# Patient Record
Sex: Male | Born: 1955 | Race: Black or African American | Hispanic: No | Marital: Married | Smoking: Former smoker
Health system: Southern US, Community
[De-identification: ages and names within clinical notes are randomized; demographics above are authoritative.]

## PROBLEM LIST (undated history)

## (undated) DIAGNOSIS — I10 Essential (primary) hypertension: Secondary | ICD-10-CM

## (undated) HISTORY — PX: OTHER SURGICAL HISTORY: SHX169

## (undated) HISTORY — DX: Essential (primary) hypertension: I10

---

## 2016-05-20 ENCOUNTER — Encounter: Payer: Self-pay | Admitting: Physician Assistant

## 2016-05-20 ENCOUNTER — Ambulatory Visit (INDEPENDENT_AMBULATORY_CARE_PROVIDER_SITE_OTHER): Payer: Self-pay | Admitting: Physician Assistant

## 2016-05-20 VITALS — BP 230/124 | HR 96 | Ht 74.0 in | Wt 271.0 lb

## 2016-05-20 DIAGNOSIS — Z13220 Encounter for screening for lipoid disorders: Secondary | ICD-10-CM

## 2016-05-20 DIAGNOSIS — I160 Hypertensive urgency: Secondary | ICD-10-CM

## 2016-05-20 DIAGNOSIS — Z131 Encounter for screening for diabetes mellitus: Secondary | ICD-10-CM

## 2016-05-20 DIAGNOSIS — Z8673 Personal history of transient ischemic attack (TIA), and cerebral infarction without residual deficits: Secondary | ICD-10-CM

## 2016-05-20 HISTORY — DX: Personal history of transient ischemic attack (TIA), and cerebral infarction without residual deficits: Z86.73

## 2016-05-20 HISTORY — DX: Hypertensive urgency: I16.0

## 2016-05-20 MED ORDER — OLMESARTAN MEDOXOMIL-HCTZ 40-25 MG PO TABS: 1.0000 | ORAL_TABLET | Freq: Every day | ORAL | 0 refills | Status: DC

## 2016-05-20 NOTE — Progress Notes (Signed)
Subjective:    Patient ID: Jeremiah Thomas, male    DOB: 29-Apr-1956, 60 y.o.   MRN: [id number]  HPI  Pt is a 59 yo AA male who presents to the clinic to establish care. Wife is with patient. He does not have insurance and only wants to address HTN today.  .. Active Ambulatory Problems    Diagnosis Date Noted  . History of stroke 05/20/2016  . Asymptomatic hypertensive urgency 05/20/2016   Resolved Ambulatory Problems    Diagnosis Date Noted  . No Resolved Ambulatory Problems   Past Medical History:  Diagnosis Date  . Hypertension    .Marland Kitchen Family History  Problem Relation Age of Onset  . Cancer Mother     ovarian  . Hypertension Father   . Diabetes Father   . Stroke Father    .Marland Kitchen Social History   Social History  . Marital status: Married    Spouse name: N/A  . Number of children: N/A  . Years of education: N/A   Occupational History  . Not on file.   Social History Main Topics  . Smoking status: Former Research scientist (life sciences)  . Smokeless tobacco: Never Used  . Alcohol use No  . Drug use: No  . Sexual activity: Not Currently   Other Topics Concern  . Not on file   Social History Narrative  . No narrative on file   Pt has hx of stroke but does not know a lot about it. States he has never been on cholesterol medicaiton or blood pressure medication. His daughter checked BP and was over 200/160. He denies any CP, palpitations. He does have SOB with exertion, numbness of lips and dizziness off and on.      Review of Systems  Constitutional: Negative for diaphoresis.  HENT: Negative.   Eyes: Negative.   Cardiovascular: Negative for chest pain, palpitations and leg swelling.  Gastrointestinal: Negative.   Endocrine: Negative.   Genitourinary: Negative.   Musculoskeletal: Negative.   Neurological: Positive for tremors, facial asymmetry, speech difficulty, weakness and headaches.  Hematological: Negative.   Psychiatric/Behavioral: Negative.        Objective:   Physical Exam   Constitutional: He is oriented to person, place, and time. He appears well-developed and well-nourished.  HENT:  Head: Normocephalic and atraumatic.  Eyes: Conjunctivae are normal.  Neck: Normal range of motion. Neck supple.  Cardiovascular: Normal rate, regular rhythm and normal heart sounds.   Pulmonary/Chest: Effort normal and breath sounds normal. He has no wheezes.  Neurological: He is alert and oriented to person, place, and time.  Psychiatric: He has a normal mood and affect. His behavior is normal.          Assessment & Plan:  .Marland KitchenJohn was seen today for establish care and hypertension.  Diagnoses and all orders for this visit:  Asymptomatic hypertensive urgency -     olmesartan-hydrochlorothiazide (BENICAR HCT) 40-25 MG tablet; Take 1 tablet by mouth daily.  Screening for lipid disorders -     Lipid panel  Screening for diabetes mellitus -     COMPLETE METABOLIC PANEL WITH GFR  History of stroke -     olmesartan-hydrochlorothiazide (BENICAR HCT) 40-25 MG tablet; Take 1 tablet by mouth daily.   Discussed need for EKG in office. Pt declined today. He did not want any work up.  Discussed importance of labs. He is aware of risk associated with high cholesterol.  Started Benicar/HCTZ recheck in 2 weeks.  Start baby ASA a day.

## 2016-05-20 NOTE — Patient Instructions (Signed)
Start baby ASA 81mg  daily.  Start benicar daily.  Check BP at home and keep log.

## 2016-06-01 ENCOUNTER — Ambulatory Visit (INDEPENDENT_AMBULATORY_CARE_PROVIDER_SITE_OTHER): Payer: Self-pay | Admitting: Physician Assistant

## 2016-06-01 ENCOUNTER — Encounter: Payer: Self-pay | Admitting: Physician Assistant

## 2016-06-01 VITALS — BP 138/88 | HR 73 | Ht 74.0 in | Wt 268.0 lb

## 2016-06-01 DIAGNOSIS — N179 Acute kidney failure, unspecified: Secondary | ICD-10-CM

## 2016-06-01 DIAGNOSIS — Z8673 Personal history of transient ischemic attack (TIA), and cerebral infarction without residual deficits: Secondary | ICD-10-CM

## 2016-06-01 DIAGNOSIS — I10 Essential (primary) hypertension: Secondary | ICD-10-CM

## 2016-06-01 LAB — COMPLETE METABOLIC PANEL WITH GFR
ALT: 10 U/L (ref 9–46)
AST: 12 U/L (ref 10–35)
Albumin: 3.9 g/dL (ref 3.6–5.1)
Alkaline Phosphatase: 53 U/L (ref 40–115)
BUN: 23 mg/dL (ref 7–25)
CO2: 30 mmol/L (ref 20–31)
Calcium: 9.4 mg/dL (ref 8.6–10.3)
Chloride: 104 mmol/L (ref 98–110)
Creat: 1.87 mg/dL — ABNORMAL HIGH (ref 0.70–1.25)
GFR, EST AFRICAN AMERICAN: 44 mL/min — AB (ref >=60)
GFR, EST NON AFRICAN AMERICAN: 38 mL/min — AB (ref >=60)
GLUCOSE: 102 mg/dL — AB (ref 65–99)
POTASSIUM: 5.4 mmol/L — AB (ref 3.5–5.3)
SODIUM: 140 mmol/L (ref 135–146)
Total Bilirubin: 0.6 mg/dL (ref 0.2–1.2)
Total Protein: 7 g/dL (ref 6.1–8.1)

## 2016-06-01 NOTE — Progress Notes (Signed)
   Subjective:    Patient ID: Jeremiah Thomas, male    DOB: May 28, 1956, 60 y.o.   MRN: [id number]  HPI  Pt is a 60 yo male who presents to the clinic to follow up from hospital. He was seen by [facility]. I do not have notes or exactly the course of treatment. He mentions "a lot of test were done" and "that he may need dialysis in the future". BP medications were changed and discharged with BP under control. He was discharged last Wednesday December 13th. He does feel better since discharge. He denies any CP, palpitations, HA, SOB, wheezing, vision changes.    Review of Systems  All other systems reviewed and are negative.      Objective:   Physical Exam  Constitutional: He is oriented to person, place, and time. He appears well-developed and well-nourished.  HENT:  Head: Normocephalic and atraumatic.  Cardiovascular: Normal rate, regular rhythm and normal heart sounds.   Pulmonary/Chest: Effort normal and breath sounds normal. He has no wheezes.  Neurological: He is alert and oriented to person, place, and time.  Psychiatric: He has a normal mood and affect. His behavior is normal.          Assessment & Plan:  .Marland KitchenJohn was seen today for hypertension.  Diagnoses and all orders for this visit:  Essential hypertension, benign -     COMPLETE METABOLIC PANEL WITH GFR  Acute kidney injury ([place]) -     COMPLETE METABOLIC PANEL WITH GFR  History of stroke   We are waiting for records for [facility]. Pt says cholesterol was checked. I would also like to see serum creatine and compare to recheck. I would also like to see EKG and if echo was done.  2nd recheck BP was better. Looked over log and scanned into EMR. Stay on same dose. Follow up in 1 month with BP recheck. Will enter labs when we receive.  Discussed with patient to not take any NSAIDs.

## 2016-06-02 DIAGNOSIS — N179 Acute kidney failure, unspecified: Secondary | ICD-10-CM

## 2016-06-02 DIAGNOSIS — I10 Essential (primary) hypertension: Secondary | ICD-10-CM | POA: Insufficient documentation

## 2016-06-02 HISTORY — DX: Essential (primary) hypertension: I10

## 2016-06-02 HISTORY — DX: Acute kidney failure, unspecified: N17.9

## 2016-06-03 ENCOUNTER — Encounter: Payer: Self-pay | Admitting: Physician Assistant

## 2016-06-03 DIAGNOSIS — N183 Chronic kidney disease, stage 3 unspecified: Secondary | ICD-10-CM

## 2016-06-03 HISTORY — DX: Chronic kidney disease, stage 3 unspecified: N18.30

## 2016-06-29 ENCOUNTER — Ambulatory Visit (INDEPENDENT_AMBULATORY_CARE_PROVIDER_SITE_OTHER): Payer: Self-pay | Admitting: Physician Assistant

## 2016-06-29 ENCOUNTER — Encounter: Payer: Self-pay | Admitting: Physician Assistant

## 2016-06-29 VITALS — BP 158/98 | HR 97 | Ht 74.0 in | Wt 268.0 lb

## 2016-06-29 DIAGNOSIS — I10 Essential (primary) hypertension: Secondary | ICD-10-CM

## 2016-06-29 DIAGNOSIS — N183 Chronic kidney disease, stage 3 unspecified: Secondary | ICD-10-CM

## 2016-06-29 LAB — BASIC METABOLIC PANEL WITH GFR
BUN: 19 mg/dL (ref 7–25)
CALCIUM: 9.9 mg/dL (ref 8.6–10.3)
CHLORIDE: 106 mmol/L (ref 98–110)
CO2: 27 mmol/L (ref 20–31)
CREATININE: 1.82 mg/dL — AB (ref 0.70–1.25)
GFR, Est African American: 45 mL/min — ABNORMAL LOW (ref >=60)
GFR, Est Non African American: 39 mL/min — ABNORMAL LOW (ref >=60)
Glucose, Bld: 108 mg/dL — ABNORMAL HIGH (ref 65–99)
Potassium: 5 mmol/L (ref 3.5–5.3)
Sodium: 143 mmol/L (ref 135–146)

## 2016-06-29 MED ORDER — AMLODIPINE BESYLATE 10 MG PO TABS: ORAL_TABLET | ORAL | 1 refills | Status: DC

## 2016-06-29 MED ORDER — METOPROLOL TARTRATE 100 MG PO TABS: 100.0000 mg | ORAL_TABLET | Freq: Two times a day (BID) | ORAL | 1 refills | Status: DC

## 2016-06-29 MED ORDER — HYDRALAZINE HCL 50 MG PO TABS: ORAL_TABLET | ORAL | 1 refills | Status: DC

## 2016-06-29 NOTE — Progress Notes (Signed)
   Subjective:    Patient ID: Jeremiah Thomas, male    DOB: 1955-09-23, 61 y.o.   MRN: [id number]  HPI  Pt is a 61 yo male who presents to the clinic for BP follow up. He brings in BP logs that range from 110's to 160's/70's to 110's. Pt denies any CP, palpitations, headaches, or dizziness. He did not take metoprolol this morning due to being out. Serum creatine was 1.87 but down from ED visit of 2.14.    Review of Systems  All other systems reviewed and are negative.      Objective:   Physical Exam  Constitutional: He is oriented to person, place, and time. He appears well-developed and well-nourished.  HENT:  Head: Normocephalic and atraumatic.  Cardiovascular: Normal rate, regular rhythm and normal heart sounds.   Pulmonary/Chest: Effort normal and breath sounds normal.  Neurological: He is alert and oriented to person, place, and time.  Psychiatric: He has a normal mood and affect. His behavior is normal.          Assessment & Plan:  .Marland KitchenJohn was seen today for hypertension.  Diagnoses and all orders for this visit:  CKD (chronic kidney disease) stage 3, GFR 30-59 ml/min -     BASIC METABOLIC PANEL WITH GFR  Essential hypertension, benign -     metoprolol (LOPRESSOR) 100 MG tablet; Take 1 tablet (100 mg total) by mouth 2 (two) times daily. -     amLODipine (NORVASC) 10 MG tablet; TK 1 T PO QD -     hydrALAZINE (APRESOLINE) 50 MG tablet; TK 1 T PO Q 8 H -     BASIC METABOLIC PANEL WITH GFR   BP still not controlled. Increased metoprolol to 100mg  twice a day. Recheck BP in 1 month. Continue to keep BP log and bring into the visit. Discussed low salt/DASH diet.

## 2016-07-08 ENCOUNTER — Encounter: Payer: Self-pay | Admitting: Physician Assistant

## 2016-07-27 ENCOUNTER — Ambulatory Visit (INDEPENDENT_AMBULATORY_CARE_PROVIDER_SITE_OTHER): Payer: Self-pay | Admitting: Physician Assistant

## 2016-07-27 VITALS — BP 134/77 | HR 70 | Wt 278.0 lb

## 2016-07-27 DIAGNOSIS — I10 Essential (primary) hypertension: Secondary | ICD-10-CM

## 2016-07-27 MED ORDER — METOPROLOL TARTRATE 100 MG PO TABS: 100.0000 mg | ORAL_TABLET | Freq: Two times a day (BID) | ORAL | 2 refills | Status: DC

## 2016-07-27 MED ORDER — AMLODIPINE BESYLATE 10 MG PO TABS: ORAL_TABLET | ORAL | 2 refills | Status: DC

## 2016-07-27 MED ORDER — HYDRALAZINE HCL 50 MG PO TABS: ORAL_TABLET | ORAL | 2 refills | Status: DC

## 2016-07-27 NOTE — Progress Notes (Signed)
Patient came into clinic today for repeat BP check. At last OV, Pt's metoprolol was increased to 100mg  BID. Pt reports no negative side effects with this Rx change. Pt does bring in home BP log with values ranging from 158/98 (97) - 118/79 (72). Will place full log with PCP for review. Advised Pt I would contact him if there are to be any changes and will let him know when to follow up.   Sent refills for 3 months. Follow up via office visit. Stay on same dose. Iran Planas PA-C

## 2016-07-27 NOTE — Progress Notes (Signed)
Pt advised, will follow up in 3 months.

## 2016-08-23 ENCOUNTER — Other Ambulatory Visit: Payer: Self-pay | Admitting: Physician Assistant

## 2016-08-23 DIAGNOSIS — I10 Essential (primary) hypertension: Secondary | ICD-10-CM

## 2016-12-06 ENCOUNTER — Other Ambulatory Visit: Payer: Self-pay | Admitting: Physician Assistant

## 2016-12-06 DIAGNOSIS — I10 Essential (primary) hypertension: Secondary | ICD-10-CM

## 2017-01-29 ENCOUNTER — Other Ambulatory Visit: Payer: Self-pay | Admitting: Physician Assistant

## 2017-01-29 DIAGNOSIS — I10 Essential (primary) hypertension: Secondary | ICD-10-CM

## 2017-03-05 ENCOUNTER — Other Ambulatory Visit: Payer: Self-pay | Admitting: Physician Assistant

## 2017-03-05 DIAGNOSIS — I10 Essential (primary) hypertension: Secondary | ICD-10-CM

## 2017-04-01 ENCOUNTER — Other Ambulatory Visit: Payer: Self-pay | Admitting: Physician Assistant

## 2017-04-01 DIAGNOSIS — I10 Essential (primary) hypertension: Secondary | ICD-10-CM

## 2017-05-11 ENCOUNTER — Ambulatory Visit (INDEPENDENT_AMBULATORY_CARE_PROVIDER_SITE_OTHER): Payer: Self-pay | Admitting: Physician Assistant

## 2017-05-11 ENCOUNTER — Encounter: Payer: Self-pay | Admitting: Physician Assistant

## 2017-05-11 VITALS — BP 189/101 | HR 98 | Ht 73.0 in | Wt 322.0 lb

## 2017-05-11 DIAGNOSIS — R351 Nocturia: Secondary | ICD-10-CM

## 2017-05-11 DIAGNOSIS — N183 Chronic kidney disease, stage 3 unspecified: Secondary | ICD-10-CM

## 2017-05-11 DIAGNOSIS — R972 Elevated prostate specific antigen [PSA]: Secondary | ICD-10-CM

## 2017-05-11 DIAGNOSIS — Z8673 Personal history of transient ischemic attack (TIA), and cerebral infarction without residual deficits: Secondary | ICD-10-CM

## 2017-05-11 DIAGNOSIS — I10 Essential (primary) hypertension: Secondary | ICD-10-CM

## 2017-05-11 MED ORDER — TAMSULOSIN HCL 0.4 MG PO CAPS: 0.4000 mg | ORAL_CAPSULE | Freq: Every day | ORAL | 3 refills | Status: DC

## 2017-05-11 MED ORDER — ATORVASTATIN CALCIUM 40 MG PO TABS: 40.0000 mg | ORAL_TABLET | Freq: Every day | ORAL | 1 refills | Status: DC

## 2017-05-11 MED ORDER — HYDRALAZINE HCL 50 MG PO TABS: 50.0000 mg | ORAL_TABLET | Freq: Three times a day (TID) | ORAL | 1 refills | Status: DC

## 2017-05-11 MED ORDER — AMLODIPINE BESYLATE 10 MG PO TABS: 10.0000 mg | ORAL_TABLET | Freq: Every day | ORAL | 1 refills | Status: DC

## 2017-05-11 MED ORDER — METOPROLOL TARTRATE 100 MG PO TABS: 100.0000 mg | ORAL_TABLET | Freq: Two times a day (BID) | ORAL | 1 refills | Status: DC

## 2017-05-11 NOTE — Progress Notes (Signed)
Subjective:    Patient ID: Jeremiah Thomas, male    DOB: July 28, 1955, 61 y.o.   MRN: [id number]  HPI  Pt is a 61 yo male with HTN, CKD and hx of stroke who presents to the clinic for a medication refill.   Pt admits he has been out of his medication for at least 2 weeks. He states we would not refill it and he was unable to make appt until now. He denies any CP, palpitations, headaches or vision changes.   Pt does have hx of stroke. He went to ED last 05/2016 and extensive work up was done with referral to cardiologist. He never went to cardiologist as he does not have insurance.  Carotid dopplers 1. There is mild atherosclerosis seen in both carotid arteries, but only  mild internal carotid artery stenosis, 1-39% bilaterally.  2. Vertebral flow is antegrade and normal bilaterally.  3. Subclavian flow is multiphasic and normal bilaterally.  Myocardial fusion testing/echo were all essentially normal.   He does report freqent urination mostly but some weak stream as well. He wonders if we can do anything.   .. Active Ambulatory Problems    Diagnosis Date Noted  . History of stroke 05/20/2016  . Asymptomatic hypertensive urgency 05/20/2016  . Acute kidney injury ([place]) 06/02/2016  . Essential hypertension, benign 06/02/2016  . CKD (chronic kidney disease) stage 3, GFR 30-59 ml/min (HCC) 06/03/2016  . Frequent urination at night 05/13/2017   Resolved Ambulatory Problems    Diagnosis Date Noted  . No Resolved Ambulatory Problems   Past Medical History:  Diagnosis Date  . Hypertension       Review of Systems  Constitutional: Negative.   Eyes: Negative.   Respiratory: Negative.   Cardiovascular: Negative.   Endocrine: Negative.   Neurological: Negative.   Psychiatric/Behavioral: Negative.        Objective:   Physical Exam  Constitutional: He is oriented to person, place, and time. He appears well-developed and well-nourished.  Morbidly obese.   HENT:  Head: Normocephalic  and atraumatic.  Cardiovascular: Normal rate, regular rhythm and normal heart sounds.  Pulmonary/Chest: Effort normal and breath sounds normal.  Musculoskeletal:  No edema of extremities seen.   Neurological: He is alert and oriented to person, place, and time.  Psychiatric: He has a normal mood and affect. His behavior is normal.          Assessment & Plan:   .Marland KitchenJohn was seen today for hypertension.  Diagnoses and all orders for this visit:  Essential hypertension, benign -     metoprolol tartrate (LOPRESSOR) 100 MG tablet; Take 1 tablet (100 mg total) by mouth 2 (two) times daily. -     hydrALAZINE (APRESOLINE) 50 MG tablet; Take 1 tablet (50 mg total) by mouth every 8 (eight) hours. -     amLODipine (NORVASC) 10 MG tablet; Take 1 tablet (10 mg total) by mouth daily. -     COMPLETE METABOLIC PANEL WITH GFR  CKD (chronic kidney disease) stage 3, GFR 30-59 ml/min (HCC) -     COMPLETE METABOLIC PANEL WITH GFR  History of stroke -     Lipid Panel w/reflex Direct LDL -     atorvastatin (LIPITOR) 40 MG tablet; Take 1 tablet (40 mg total) by mouth daily.  Frequent urination at night -     PSA -     tamsulosin (FLOMAX) 0.4 MG CAPS capsule; Take 1 capsule (0.4 mg total) by mouth daily.   Restart ALL BP  medications and follow up in 2 weeks for nurse visit BP check.   I am concerned for his overall cardiac risk. Pt did have a full cardiac work up 05/2016 when he was having CP. Discussed starting a statin. He agrees sent lipitor. Pt is on a ASA.   Due to obesity and fatigue discussed sleep apnea. Pt declined and sleep apnea testing.   Marland Kitchen.Discussed low carb diet with 1500 calories and 80g of protein.  Exercising at least 150 minutes a week.  [employer] could be a Microbiologist.   Marland Kitchen.Spent 30 minutes with patient and greater than 50 percent of visit spent counseling patient regarding treatment plan.

## 2017-05-13 ENCOUNTER — Encounter: Payer: Self-pay | Admitting: Physician Assistant

## 2017-05-13 DIAGNOSIS — R351 Nocturia: Secondary | ICD-10-CM

## 2017-05-13 HISTORY — DX: Nocturia: R35.1

## 2017-05-25 ENCOUNTER — Ambulatory Visit: Payer: Self-pay

## 2017-06-01 ENCOUNTER — Ambulatory Visit (INDEPENDENT_AMBULATORY_CARE_PROVIDER_SITE_OTHER): Payer: Self-pay | Admitting: Physician Assistant

## 2017-06-01 VITALS — BP 133/82 | HR 68 | Resp 16 | Wt 316.0 lb

## 2017-06-01 DIAGNOSIS — I10 Essential (primary) hypertension: Secondary | ICD-10-CM

## 2017-06-01 NOTE — Progress Notes (Signed)
   Subjective:    Patient ID: Jeremiah Thomas, male    DOB: [birth year], 61 y.o.   MRN: [id number]  HPI  Liahm is here for a blood pressure check. Last visit his blood pressure was 189/101. He does have some dizziness for the last month but believes it is due to a virus. Denies chest pain, shortness of breath or headaches. Home blood pressure readings have slowly lowered daily.   Review of Systems     Objective:   Physical Exam        Assessment & Plan:  Hypertension - Recheck of blood pressure within normal limits. Home readings sent to scan. Patient advised to follow up in 3 months. Continue on current BP medications.   Agree with above plan. Jade Breeback PA-C.

## 2017-07-10 ENCOUNTER — Other Ambulatory Visit: Payer: Self-pay | Admitting: Physician Assistant

## 2017-07-10 DIAGNOSIS — I10 Essential (primary) hypertension: Secondary | ICD-10-CM

## 2017-07-19 LAB — COMPLETE METABOLIC PANEL WITH GFR
AG Ratio: 1.3 (calc) (ref 1.0–2.5)
ALKALINE PHOSPHATASE (APISO): 74 U/L (ref 40–115)
ALT: 18 U/L (ref 9–46)
AST: 14 U/L (ref 10–35)
Albumin: 3.9 g/dL (ref 3.6–5.1)
BILIRUBIN TOTAL: 0.6 mg/dL (ref 0.2–1.2)
BUN / CREAT RATIO: 12 (calc) (ref 6–22)
BUN: 22 mg/dL (ref 7–25)
CHLORIDE: 104 mmol/L (ref 98–110)
CO2: 29 mmol/L (ref 20–32)
CREATININE: 1.82 mg/dL — AB (ref 0.70–1.25)
Calcium: 9.6 mg/dL (ref 8.6–10.3)
GFR, Est African American: 45 mL/min/{1.73_m2} — ABNORMAL LOW (ref >=60)
GFR, Est Non African American: 39 mL/min/{1.73_m2} — ABNORMAL LOW (ref >=60)
GLUCOSE: 291 mg/dL — AB (ref 65–99)
Globulin: 2.9 g/dL (calc) (ref 1.9–3.7)
Potassium: 4.7 mmol/L (ref 3.5–5.3)
SODIUM: 139 mmol/L (ref 135–146)
Total Protein: 6.8 g/dL (ref 6.1–8.1)

## 2017-07-19 LAB — LIPID PANEL W/REFLEX DIRECT LDL
CHOL/HDL RATIO: 2.9 (calc) (ref <5.0)
Cholesterol: 118 mg/dL (ref <200)
HDL: 41 mg/dL (ref >40)
LDL CHOLESTEROL (CALC): 57 mg/dL
Non-HDL Cholesterol (Calc): 77 mg/dL (calc) (ref <130)
TRIGLYCERIDES: 122 mg/dL (ref <150)

## 2017-07-19 LAB — PSA: PSA: 4.6 ng/mL — AB (ref <=4.0)

## 2017-07-20 ENCOUNTER — Encounter: Payer: Self-pay | Admitting: Physician Assistant

## 2017-07-20 DIAGNOSIS — R972 Elevated prostate specific antigen [PSA]: Secondary | ICD-10-CM

## 2017-07-20 DIAGNOSIS — R7301 Impaired fasting glucose: Secondary | ICD-10-CM

## 2017-07-20 HISTORY — DX: Impaired fasting glucose: R73.01

## 2017-07-20 HISTORY — DX: Elevated prostate specific antigen (PSA): R97.20

## 2017-07-23 NOTE — Progress Notes (Signed)
Done

## 2017-07-23 NOTE — Addendum Note (Signed)
Addended by: [name] on: 07/23/2017 03:35 PM   Modules accepted: Orders

## 2017-08-11 ENCOUNTER — Encounter: Payer: Self-pay | Admitting: Physician Assistant

## 2017-08-11 ENCOUNTER — Ambulatory Visit: Payer: BLUE CROSS/BLUE SHIELD | Admitting: Physician Assistant

## 2017-08-11 VITALS — BP 160/79 | HR 65 | Ht 73.0 in | Wt 306.0 lb

## 2017-08-11 DIAGNOSIS — N183 Chronic kidney disease, stage 3 unspecified: Secondary | ICD-10-CM

## 2017-08-11 DIAGNOSIS — R0981 Nasal congestion: Secondary | ICD-10-CM | POA: Diagnosis not present

## 2017-08-11 DIAGNOSIS — I10 Essential (primary) hypertension: Secondary | ICD-10-CM

## 2017-08-11 MED ORDER — FLUTICASONE PROPIONATE 50 MCG/ACT NA SUSP: 2.0000 | Freq: Every day | NASAL | 1 refills | Status: DC

## 2017-08-11 MED ORDER — HYDRALAZINE HCL 100 MG PO TABS: 100.0000 mg | ORAL_TABLET | Freq: Two times a day (BID) | ORAL | 1 refills | Status: DC

## 2017-08-11 NOTE — Progress Notes (Signed)
   Subjective:    Patient ID: Jeremiah Thomas, male    DOB: 05/07/1956, 62 y.o.   MRN: [id number]  HPI  Pt is a 62 yo obese male with HTN, CKD, elevated PSA who presents to the clinic for BP follow up.   Pt denies any CP, palpitations, headaches, vision changes. He reports to be taking most of his medications. Home BP readings are 140 to 136 on top. He is taking hydralazine, norvasc, metoprolol, hyzaar.   Pt is having a lot of persistent nasal congestion, itchy eyes. He is not doing anything to make better.   .. Active Ambulatory Problems    Diagnosis Date Noted  . History of stroke 05/20/2016  . Asymptomatic hypertensive urgency 05/20/2016  . Acute kidney injury ([place]) 06/02/2016  . Essential hypertension, benign 06/02/2016  . CKD (chronic kidney disease) stage 3, GFR 30-59 ml/min (HCC) 06/03/2016  . Frequent urination at night 05/13/2017  . Elevated PSA 07/20/2017  . Elevated fasting glucose 07/20/2017  . Nasal congestion 08/15/2017   Resolved Ambulatory Problems    Diagnosis Date Noted  . No Resolved Ambulatory Problems   Past Medical History:  Diagnosis Date  . Hypertension       Review of Systems  All other systems reviewed and are negative.      Objective:   Physical Exam  Constitutional: He is oriented to person, place, and time. He appears well-developed and well-nourished.  Obese.   HENT:  Head: Normocephalic and atraumatic.  Right Ear: External ear normal.  Left Ear: External ear normal.  Nose: Nose normal.  Mouth/Throat: Oropharynx is clear and moist. No oropharyngeal exudate.  Swollen and red nasal turbinates.   Eyes: Conjunctivae are normal.  Neck: Normal range of motion. Neck supple.  Cardiovascular: Normal rate, regular rhythm and normal heart sounds.  Pulmonary/Chest: Effort normal and breath sounds normal.  Neurological: He is alert and oriented to person, place, and time.  Psychiatric: He has a normal mood and affect. His behavior is normal.           Assessment & Plan:  .Marland KitchenJohn was seen today for hypertension.  Diagnoses and all orders for this visit:  Essential hypertension, benign -     hydrALAZINE (APRESOLINE) 100 MG tablet; Take 1 tablet (100 mg total) by mouth 2 (two) times daily.  CKD (chronic kidney disease) stage 3, GFR 30-59 ml/min (HCC)  Nasal congestion -     fluticasone (FLONASE) 50 MCG/ACT nasal spray; Place 2 sprays into both nostrils daily.   BP not controlled. Increased hydralazine. Continue on norvasc, hyzaar, metoprolol. Discussed DASH diet. Follow up in 4 weeks.   Will need to check CMP in 4 weeks.   Consider flonase for nasal congestion and add too zyrtec daily. Follow up as needed.

## 2017-08-11 NOTE — Patient Instructions (Signed)
Coricidin HBP for cold like   DASH Eating Plan DASH stands for "Dietary Approaches to Stop Hypertension." The DASH eating plan is a healthy eating plan that has been shown to reduce high blood pressure (hypertension). It may also reduce your risk for type 2 diabetes, heart disease, and stroke. The DASH eating plan may also help with weight loss. What are tips for following this plan? General guidelines  Avoid eating more than 2,300 mg (milligrams) of salt (sodium) a day. If you have hypertension, you may need to reduce your sodium intake to 1,500 mg a day.  Limit alcohol intake to no more than 1 drink a day for nonpregnant women and 2 drinks a day for men. One drink equals 12 oz of beer, 5 oz of wine, or 1 oz of hard liquor.  Work with your health care provider to maintain a healthy body weight or to lose weight. Ask what an ideal weight is for you.  Get at least 30 minutes of exercise that causes your heart to beat faster (aerobic exercise) most days of the week. Activities may include walking, swimming, or biking.  Work with your health care provider or diet and nutrition specialist (dietitian) to adjust your eating plan to your individual calorie needs. Reading food labels  Check food labels for the amount of sodium per serving. Choose foods with less than 5 percent of the Daily Value of sodium. Generally, foods with less than 300 mg of sodium per serving fit into this eating plan.  To find whole grains, look for the word "whole" as the first word in the ingredient list. Shopping  Buy products labeled as "low-sodium" or "no salt added."  Buy fresh foods. Avoid canned foods and premade or frozen meals. Cooking  Avoid adding salt when cooking. Use salt-free seasonings or herbs instead of table salt or sea salt. Check with your health care provider or pharmacist before using salt substitutes.  Do not fry foods. Cook foods using healthy methods such as baking, boiling, grilling, and  broiling instead.  Cook with heart-healthy oils, such as olive, canola, soybean, or sunflower oil. Meal planning   Eat a balanced diet that includes: ? 5 or more servings of fruits and vegetables each day. At each meal, try to fill half of your plate with fruits and vegetables. ? Up to 6-8 servings of whole grains each day. ? Less than 6 oz of lean meat, poultry, or fish each day. A 3-oz serving of meat is about the same size as a deck of cards. One egg equals 1 oz. ? 2 servings of low-fat dairy each day. ? A serving of nuts, seeds, or beans 5 times each week. ? Heart-healthy fats. Healthy fats called Omega-3 fatty acids are found in foods such as flaxseeds and coldwater fish, like sardines, salmon, and mackerel.  Limit how much you eat of the following: ? Canned or prepackaged foods. ? Food that is high in trans fat, such as fried foods. ? Food that is high in saturated fat, such as fatty meat. ? Sweets, desserts, sugary drinks, and other foods with added sugar. ? Full-fat dairy products.  Do not salt foods before eating.  Try to eat at least 2 vegetarian meals each week.  Eat more home-cooked food and less restaurant, buffet, and fast food.  When eating at a restaurant, ask that your food be prepared with less salt or no salt, if possible. What foods are recommended? The items listed may not be a complete  list. Talk with your dietitian about what dietary choices are best for you. Grains Whole-grain or whole-wheat bread. Whole-grain or whole-wheat pasta. Brown rice. [place]. Bulgur. Whole-grain and low-sodium cereals. Pita bread. Low-fat, low-sodium crackers. Whole-wheat flour tortillas. Vegetables Fresh or frozen vegetables (raw, steamed, roasted, or grilled). Low-sodium or reduced-sodium tomato and vegetable juice. Low-sodium or reduced-sodium tomato sauce and tomato paste. Low-sodium or reduced-sodium canned vegetables. Fruits All fresh, dried, or frozen fruit. Canned  fruit in natural juice (without added sugar). Meat and other protein foods Skinless chicken or [place]. Ground chicken or [place]. Pork with fat trimmed off. Fish and seafood. Egg whites. Dried beans, peas, or lentils. Unsalted nuts, nut butters, and seeds. Unsalted canned beans. Lean cuts of beef with fat trimmed off. Low-sodium, lean deli meat. Dairy Low-fat (1%) or fat-free (skim) milk. Fat-free, low-fat, or reduced-fat cheeses. Nonfat, low-sodium ricotta or cottage cheese. Low-fat or nonfat yogurt. Low-fat, low-sodium cheese. Fats and oils Soft margarine without trans fats. Vegetable oil. Low-fat, reduced-fat, or light mayonnaise and salad dressings (reduced-sodium). Canola, safflower, olive, soybean, and sunflower oils. Avocado. Seasoning and other foods Herbs. Spices. Seasoning mixes without salt. Unsalted popcorn and pretzels. Fat-free sweets. What foods are not recommended? The items listed may not be a complete list. Talk with your dietitian about what dietary choices are best for you. Grains Baked goods made with fat, such as croissants, muffins, or some breads. Dry pasta or rice meal packs. Vegetables Creamed or fried vegetables. Vegetables in a cheese sauce. Regular canned vegetables (not low-sodium or reduced-sodium). Regular canned tomato sauce and paste (not low-sodium or reduced-sodium). Regular tomato and vegetable juice (not low-sodium or reduced-sodium). Angie Fava. Olives. Fruits Canned fruit in a light or heavy syrup. Fried fruit. Fruit in cream or butter sauce. Meat and other protein foods Fatty cuts of meat. Ribs. Fried meat. Berniece Salines. Sausage. Bologna and other processed lunch meats. Salami. Fatback. Hotdogs. Bratwurst. Salted nuts and seeds. Canned beans with added salt. Canned or smoked fish. Whole eggs or egg yolks. Chicken or [place] with skin. Dairy Whole or 2% milk, cream, and half-and-half. Whole or full-fat cream cheese. Whole-fat or sweetened yogurt. Full-fat cheese.  Nondairy creamers. Whipped toppings. Processed cheese and cheese spreads. Fats and oils Butter. Stick margarine. Lard. Shortening. Ghee. Bacon fat. Tropical oils, such as coconut, palm kernel, or palm oil. Seasoning and other foods Salted popcorn and pretzels. Onion salt, garlic salt, seasoned salt, table salt, and sea salt. Worcestershire sauce. Tartar sauce. Barbecue sauce. Teriyaki sauce. Soy sauce, including reduced-sodium. Steak sauce. Canned and packaged gravies. Fish sauce. Oyster sauce. Cocktail sauce. Horseradish that you find on the shelf. Ketchup. Mustard. Meat flavorings and tenderizers. Bouillon cubes. Hot sauce and Tabasco sauce. Premade or packaged marinades. Premade or packaged taco seasonings. Relishes. Regular salad dressings. Where to find more information:  National Heart, Lung, and [place]: https://wilson-eaton.com/  American Heart Association: www.heart.org Summary  The DASH eating plan is a healthy eating plan that has been shown to reduce high blood pressure (hypertension). It may also reduce your risk for type 2 diabetes, heart disease, and stroke.  With the DASH eating plan, you should limit salt (sodium) intake to 2,300 mg a day. If you have hypertension, you may need to reduce your sodium intake to 1,500 mg a day.  When on the DASH eating plan, aim to eat more fresh fruits and vegetables, whole grains, lean proteins, low-fat dairy, and heart-healthy fats.  Work with your health care provider or diet and nutrition specialist (dietitian) to  adjust your eating plan to your individual calorie needs. This information is not intended to replace advice given to you by your health care provider. Make sure you discuss any questions you have with your health care provider. Document Released: 05/21/2011 Document Revised: 05/25/2016 Document Reviewed: 05/25/2016 Elsevier Interactive Patient Education  [employer].

## 2017-08-15 DIAGNOSIS — R0981 Nasal congestion: Secondary | ICD-10-CM | POA: Insufficient documentation

## 2017-08-15 HISTORY — DX: Nasal congestion: R09.81

## 2017-09-02 ENCOUNTER — Other Ambulatory Visit: Payer: Self-pay | Admitting: Physician Assistant

## 2017-09-02 DIAGNOSIS — I10 Essential (primary) hypertension: Secondary | ICD-10-CM

## 2017-09-02 DIAGNOSIS — R351 Nocturia: Secondary | ICD-10-CM

## 2017-09-08 ENCOUNTER — Ambulatory Visit: Payer: BLUE CROSS/BLUE SHIELD | Admitting: Physician Assistant

## 2017-09-08 ENCOUNTER — Encounter: Payer: Self-pay | Admitting: Physician Assistant

## 2017-09-08 VITALS — BP 125/75 | HR 75 | Ht 73.0 in | Wt 310.0 lb

## 2017-09-08 DIAGNOSIS — N183 Chronic kidney disease, stage 3 unspecified: Secondary | ICD-10-CM

## 2017-09-08 DIAGNOSIS — R351 Nocturia: Secondary | ICD-10-CM | POA: Diagnosis not present

## 2017-09-08 DIAGNOSIS — Z6841 Body Mass Index (BMI) 40.0 and over, adult: Secondary | ICD-10-CM | POA: Insufficient documentation

## 2017-09-08 DIAGNOSIS — R7301 Impaired fasting glucose: Secondary | ICD-10-CM | POA: Diagnosis not present

## 2017-09-08 DIAGNOSIS — I10 Essential (primary) hypertension: Secondary | ICD-10-CM | POA: Diagnosis not present

## 2017-09-08 HISTORY — DX: Morbid (severe) obesity due to excess calories: E66.01

## 2017-09-08 MED ORDER — TAMSULOSIN HCL 0.4 MG PO CAPS: 0.8000 mg | ORAL_CAPSULE | Freq: Every day | ORAL | 2 refills | Status: DC

## 2017-09-08 NOTE — Patient Instructions (Signed)
Diabetes Mellitus and Nutrition When you have diabetes (diabetes mellitus), it is very important to have healthy eating habits because your blood sugar (glucose) levels are greatly affected by what you eat and drink. Eating healthy foods in the appropriate amounts, at about the same times every day, can help you:  Control your blood glucose.  Lower your risk of heart disease.  Improve your blood pressure.  Reach or maintain a healthy weight.  Every person with diabetes is different, and each person has different needs for a meal plan. Your health care provider may recommend that you work with a diet and nutrition specialist (dietitian) to make a meal plan that is best for you. Your meal plan may vary depending on factors such as:  The calories you need.  The medicines you take.  Your weight.  Your blood glucose, blood pressure, and cholesterol levels.  Your activity level.  Other health conditions you have, such as heart or kidney disease.  How do carbohydrates affect me? Carbohydrates affect your blood glucose level more than any other type of food. Eating carbohydrates naturally increases the amount of glucose in your blood. Carbohydrate counting is a method for keeping track of how many carbohydrates you eat. Counting carbohydrates is important to keep your blood glucose at a healthy level, especially if you use insulin or take certain oral diabetes medicines. It is important to know how many carbohydrates you can safely have in each meal. This is different for every person. Your dietitian can help you calculate how many carbohydrates you should have at each meal and for snack. Foods that contain carbohydrates include:  Bread, cereal, rice, pasta, and crackers.  Potatoes and corn.  Peas, beans, and lentils.  Milk and yogurt.  Fruit and juice.  Desserts, such as cakes, cookies, ice cream, and candy.  How does alcohol affect me? Alcohol can cause a sudden decrease in blood  glucose (hypoglycemia), especially if you use insulin or take certain oral diabetes medicines. Hypoglycemia can be a life-threatening condition. Symptoms of hypoglycemia (sleepiness, dizziness, and confusion) are similar to symptoms of having too much alcohol. If your health care provider says that alcohol is safe for you, follow these guidelines:  Limit alcohol intake to no more than 1 drink per day for nonpregnant women and 2 drinks per day for men. One drink equals 12 oz of beer, 5 oz of wine, or 1 oz of hard liquor.  Do not drink on an empty stomach.  Keep yourself hydrated with water, diet soda, or unsweetened iced tea.  Keep in mind that regular soda, juice, and other mixers may contain a lot of sugar and must be counted as carbohydrates.  What are tips for following this plan? Reading food labels  Start by checking the serving size on the label. The amount of calories, carbohydrates, fats, and other nutrients listed on the label are based on one serving of the food. Many foods contain more than one serving per package.  Check the total grams (g) of carbohydrates in one serving. You can calculate the number of servings of carbohydrates in one serving by dividing the total carbohydrates by 15. For example, if a food has 30 g of total carbohydrates, it would be equal to 2 servings of carbohydrates.  Check the number of grams (g) of saturated and trans fats in one serving. Choose foods that have low or no amount of these fats.  Check the number of milligrams (mg) of sodium in one serving. Most people   should limit total sodium intake to less than 2,300 mg per day.  Always check the nutrition information of foods labeled as "low-fat" or "nonfat". These foods may be higher in added sugar or refined carbohydrates and should be avoided.  Talk to your dietitian to identify your daily goals for nutrients listed on the label. Shopping  Avoid buying canned, premade, or processed foods. These  foods tend to be high in fat, sodium, and added sugar.  Shop around the outside edge of the grocery store. This includes fresh fruits and vegetables, bulk grains, fresh meats, and fresh dairy. Cooking  Use low-heat cooking methods, such as baking, instead of high-heat cooking methods like deep frying.  Cook using healthy oils, such as olive, canola, or sunflower oil.  Avoid cooking with butter, cream, or high-fat meats. Meal planning  Eat meals and snacks regularly, preferably at the same times every day. Avoid going long periods of time without eating.  Eat foods high in fiber, such as fresh fruits, vegetables, beans, and whole grains. Talk to your dietitian about how many servings of carbohydrates you can eat at each meal.  Eat 4-6 ounces of lean protein each day, such as lean meat, chicken, fish, eggs, or tofu. 1 ounce is equal to 1 ounce of meat, chicken, or fish, 1 egg, or 1/4 cup of tofu.  Eat some foods each day that contain healthy fats, such as avocado, nuts, seeds, and fish. Lifestyle   Check your blood glucose regularly.  Exercise at least 30 minutes 5 or more days each week, or as told by your health care provider.  Take medicines as told by your health care provider.  Do not use any products that contain nicotine or tobacco, such as cigarettes and e-cigarettes. If you need help quitting, ask your health care provider.  Work with a counselor or diabetes educator to identify strategies to manage stress and any emotional and social challenges. What are some questions to ask my health care provider?  Do I need to meet with a diabetes educator?  Do I need to meet with a dietitian?  What number can I call if I have questions?  When are the best times to check my blood glucose? Where to find more information:  American Diabetes Association: diabetes.org/food-and-fitness/food  Academy of Nutrition and Dietetics:  www.eatright.org/resources/health/diseases-and-conditions/diabetes  National Institute of Diabetes and Digestive and Kidney Diseases (NIH): www.niddk.nih.gov/health-information/diabetes/overview/diet-eating-physical-activity Summary  A healthy meal plan will help you control your blood glucose and maintain a healthy lifestyle.  Working with a diet and nutrition specialist (dietitian) can help you make a meal plan that is best for you.  Keep in mind that carbohydrates and alcohol have immediate effects on your blood glucose levels. It is important to count carbohydrates and to use alcohol carefully. This information is not intended to replace advice given to you by your health care provider. Make sure you discuss any questions you have with your health care provider. Document Released: 02/26/2005 Document Revised: 07/06/2016 Document Reviewed: 07/06/2016 Elsevier Interactive Patient Education  2018 Elsevier Inc.  

## 2017-09-08 NOTE — Progress Notes (Signed)
   Subjective:    Patient ID: Arsalan Brisbin, male    DOB: 1955-12-28, 62 y.o.   MRN: [id number]  HPI Pt is a 62 yo morbidly obese male who presents to the clinic for 4 week follow up on HTN.   HTN- taking BP at home but all over the place. He has some in 120's and some in 150's. No CP, palpitations, headaches or vision changes.   His urologist increased flomax to 2 tablets daily. He has biopsy scheduled for early April. Urinary frequency and symptoms are improving.   Last labs fasting glucose was elevated. Needs follow up.   .. Active Ambulatory Problems    Diagnosis Date Noted  . History of stroke 05/20/2016  . Asymptomatic hypertensive urgency 05/20/2016  . Acute kidney injury ([place]) 06/02/2016  . Essential hypertension, benign 06/02/2016  . CKD (chronic kidney disease) stage 3, GFR 30-59 ml/min (HCC) 06/03/2016  . Frequent urination at night 05/13/2017  . Elevated PSA 07/20/2017  . Elevated fasting glucose 07/20/2017  . Nasal congestion 08/15/2017   Resolved Ambulatory Problems    Diagnosis Date Noted  . No Resolved Ambulatory Problems   Past Medical History:  Diagnosis Date  . Hypertension       Review of Systems  All other systems reviewed and are negative.      Objective:   Physical Exam  Constitutional: He is oriented to person, place, and time. He appears well-developed and well-nourished.  Obese.   HENT:  Head: Normocephalic and atraumatic.  Cardiovascular: Normal rate, regular rhythm and normal heart sounds.  Pulmonary/Chest: Effort normal and breath sounds normal. He has no wheezes.  Neurological: He is alert and oriented to person, place, and time.  Psychiatric: He has a normal mood and affect. His behavior is normal.          Assessment & Plan:  .Marland KitchenJohn was seen today for hypertension.  Diagnoses and all orders for this visit:  Essential hypertension, benign -     COMPLETE METABOLIC PANEL WITH GFR  CKD (chronic kidney disease) stage 3, GFR 30-59  ml/min (HCC) -     COMPLETE METABOLIC PANEL WITH GFR  Frequent urination at night -     tamsulosin (FLOMAX) 0.4 MG CAPS capsule; Take 2 capsules (0.8 mg total) by mouth daily.  Elevated fasting glucose -     Hemoglobin A1c   BP looks great! Continue with medications and low salt diet. CMP to recheck kidneys.   Keep follow up with urology for biopsy. Will continue to follow.   Labs to reevaluate elevated fasting glucose. Went ahead to discuss diabetic diet. He is aware will likely start metformin if A!C elevated. Pt aware of increased CV risk with DM.   Discussed weight. Marland Kitchen.Discussed low carb diet with 1500 calories and 80g of protein.  Exercising at least 150 minutes a week.  [employer] could be a Microbiologist.

## 2017-09-09 LAB — COMPLETE METABOLIC PANEL WITH GFR
AG RATIO: 1.7 (calc) (ref 1.0–2.5)
ALBUMIN MSPROF: 3.9 g/dL (ref 3.6–5.1)
ALT: 19 U/L (ref 9–46)
AST: 14 U/L (ref 10–35)
Alkaline phosphatase (APISO): 72 U/L (ref 40–115)
BILIRUBIN TOTAL: 0.5 mg/dL (ref 0.2–1.2)
BUN / CREAT RATIO: 9 (calc) (ref 6–22)
BUN: 15 mg/dL (ref 7–25)
CHLORIDE: 107 mmol/L (ref 98–110)
CO2: 30 mmol/L (ref 20–32)
Calcium: 9 mg/dL (ref 8.6–10.3)
Creat: 1.67 mg/dL — ABNORMAL HIGH (ref 0.70–1.25)
GFR, Est African American: 50 mL/min/{1.73_m2} — ABNORMAL LOW (ref >=60)
GFR, Est Non African American: 43 mL/min/{1.73_m2} — ABNORMAL LOW (ref >=60)
GLOBULIN: 2.3 g/dL (ref 1.9–3.7)
Glucose, Bld: 187 mg/dL — ABNORMAL HIGH (ref 65–99)
POTASSIUM: 4.7 mmol/L (ref 3.5–5.3)
Sodium: 141 mmol/L (ref 135–146)
TOTAL PROTEIN: 6.2 g/dL (ref 6.1–8.1)

## 2017-09-09 LAB — HEMOGLOBIN A1C
EAG (MMOL/L): 14.3 (calc)
Hgb A1c MFr Bld: 10.6 % of total Hgb — ABNORMAL HIGH (ref <5.7)
MEAN PLASMA GLUCOSE: 258 (calc)

## 2017-09-13 ENCOUNTER — Encounter: Payer: Self-pay | Admitting: Physician Assistant

## 2017-09-13 DIAGNOSIS — IMO0002 Reserved for concepts with insufficient information to code with codable children: Secondary | ICD-10-CM

## 2017-09-13 DIAGNOSIS — E1165 Type 2 diabetes mellitus with hyperglycemia: Secondary | ICD-10-CM | POA: Insufficient documentation

## 2017-09-13 HISTORY — DX: Reserved for concepts with insufficient information to code with codable children: IMO0002

## 2017-09-13 HISTORY — DX: Type 2 diabetes mellitus with hyperglycemia: E11.65

## 2017-09-13 NOTE — Progress Notes (Signed)
Call pt: A!C is very high. You have Diabetes like we suspected and talked about in office. We do need to be fairly aggressive with blood sugars averaging 258. I am sending over a combination pill but would like to add a once weekly injection(that is NOT insulin) how do you feel about this?   Side effects nausea, diarrhea, yeast infections.   Follow up in 3 months.

## 2017-09-17 ENCOUNTER — Other Ambulatory Visit: Payer: Self-pay | Admitting: Physician Assistant

## 2017-09-17 DIAGNOSIS — I10 Essential (primary) hypertension: Secondary | ICD-10-CM

## 2017-09-20 ENCOUNTER — Telehealth: Payer: Self-pay

## 2017-09-20 NOTE — Telephone Encounter (Signed)
Jeremiah Thomas is still waiting on new injectable medication. See result note.

## 2017-09-21 MED ORDER — GLUCOSE BLOOD VI STRP: ORAL_STRIP | 12 refills | Status: DC

## 2017-09-21 MED ORDER — DULAGLUTIDE 0.75 MG/0.5ML ~~LOC~~ SOAJ: 0.7500 mg | SUBCUTANEOUS | 3 refills | Status: DC

## 2017-09-21 MED ORDER — PEN NEEDLES 31G X 8 MM MISC: 99 refills | Status: DC

## 2017-09-21 MED ORDER — SITAGLIPTIN PHOSPHATE 100 MG PO TABS: 100.0000 mg | ORAL_TABLET | Freq: Every day | ORAL | 2 refills | Status: DC

## 2017-09-21 MED ORDER — LANCETS MISC: 99 refills | Status: DC

## 2017-09-21 MED ORDER — MM EASY TOUCH GLUCOSE METER W/DEVICE KIT: 1.0000 | PACK | Freq: Every day | 0 refills | Status: DC

## 2017-09-21 NOTE — Telephone Encounter (Signed)
Jade gave verbal order for Januvia, Trulicity, pen needles, lancets, glucometer and strips.

## 2017-10-10 ENCOUNTER — Other Ambulatory Visit: Payer: Self-pay | Admitting: Physician Assistant

## 2017-10-10 DIAGNOSIS — I10 Essential (primary) hypertension: Secondary | ICD-10-CM

## 2017-10-22 ENCOUNTER — Encounter: Payer: Self-pay | Admitting: Physician Assistant

## 2017-10-22 DIAGNOSIS — C61 Malignant neoplasm of prostate: Secondary | ICD-10-CM

## 2017-10-22 HISTORY — DX: Malignant neoplasm of prostate: C61

## 2017-11-12 ENCOUNTER — Other Ambulatory Visit: Payer: Self-pay | Admitting: Physician Assistant

## 2017-11-12 DIAGNOSIS — I10 Essential (primary) hypertension: Secondary | ICD-10-CM

## 2017-11-12 DIAGNOSIS — Z8673 Personal history of transient ischemic attack (TIA), and cerebral infarction without residual deficits: Secondary | ICD-10-CM

## 2017-12-12 ENCOUNTER — Other Ambulatory Visit: Payer: Self-pay | Admitting: Physician Assistant

## 2017-12-12 DIAGNOSIS — I10 Essential (primary) hypertension: Secondary | ICD-10-CM

## 2018-01-05 ENCOUNTER — Other Ambulatory Visit: Payer: Self-pay | Admitting: Physician Assistant

## 2018-01-11 ENCOUNTER — Other Ambulatory Visit: Payer: Self-pay | Admitting: Physician Assistant

## 2018-01-11 DIAGNOSIS — I10 Essential (primary) hypertension: Secondary | ICD-10-CM

## 2018-01-13 ENCOUNTER — Other Ambulatory Visit: Payer: Self-pay | Admitting: Physician Assistant

## 2018-01-13 DIAGNOSIS — R351 Nocturia: Secondary | ICD-10-CM

## 2018-01-30 ENCOUNTER — Other Ambulatory Visit: Payer: Self-pay | Admitting: Family Medicine

## 2018-02-15 ENCOUNTER — Ambulatory Visit (INDEPENDENT_AMBULATORY_CARE_PROVIDER_SITE_OTHER): Payer: BLUE CROSS/BLUE SHIELD | Admitting: Physician Assistant

## 2018-02-15 ENCOUNTER — Encounter: Payer: Self-pay | Admitting: Physician Assistant

## 2018-02-15 VITALS — BP 157/97 | HR 101 | Ht 73.0 in | Wt 306.0 lb

## 2018-02-15 DIAGNOSIS — R011 Cardiac murmur, unspecified: Secondary | ICD-10-CM

## 2018-02-15 DIAGNOSIS — N183 Chronic kidney disease, stage 3 unspecified: Secondary | ICD-10-CM

## 2018-02-15 DIAGNOSIS — Z8673 Personal history of transient ischemic attack (TIA), and cerebral infarction without residual deficits: Secondary | ICD-10-CM

## 2018-02-15 DIAGNOSIS — Z131 Encounter for screening for diabetes mellitus: Secondary | ICD-10-CM | POA: Diagnosis not present

## 2018-02-15 DIAGNOSIS — I10 Essential (primary) hypertension: Secondary | ICD-10-CM | POA: Diagnosis not present

## 2018-02-15 DIAGNOSIS — E1165 Type 2 diabetes mellitus with hyperglycemia: Secondary | ICD-10-CM

## 2018-02-15 DIAGNOSIS — R600 Localized edema: Secondary | ICD-10-CM | POA: Diagnosis not present

## 2018-02-15 DIAGNOSIS — R638 Other symptoms and signs concerning food and fluid intake: Secondary | ICD-10-CM

## 2018-02-15 DIAGNOSIS — Q800 Ichthyosis vulgaris: Secondary | ICD-10-CM

## 2018-02-15 DIAGNOSIS — C61 Malignant neoplasm of prostate: Secondary | ICD-10-CM

## 2018-02-15 LAB — POCT GLYCOSYLATED HEMOGLOBIN (HGB A1C): Hemoglobin A1C: 6.1 % — AB (ref 4.0–5.6)

## 2018-02-15 MED ORDER — DULAGLUTIDE 0.75 MG/0.5ML ~~LOC~~ SOAJ: SUBCUTANEOUS | 1 refills | Status: DC

## 2018-02-15 MED ORDER — HYDRALAZINE HCL 100 MG PO TABS: 100.0000 mg | ORAL_TABLET | Freq: Two times a day (BID) | ORAL | 1 refills | Status: DC

## 2018-02-15 MED ORDER — METOPROLOL TARTRATE 100 MG PO TABS: 100.0000 mg | ORAL_TABLET | Freq: Two times a day (BID) | ORAL | 1 refills | Status: DC

## 2018-02-15 MED ORDER — SITAGLIPTIN PHOSPHATE 100 MG PO TABS: 100.0000 mg | ORAL_TABLET | Freq: Every day | ORAL | 1 refills | Status: DC

## 2018-02-15 MED ORDER — ATORVASTATIN CALCIUM 40 MG PO TABS: 40.0000 mg | ORAL_TABLET | Freq: Every day | ORAL | 3 refills | Status: DC

## 2018-02-15 MED ORDER — FUROSEMIDE 20 MG PO TABS: 20.0000 mg | ORAL_TABLET | Freq: Two times a day (BID) | ORAL | 1 refills | Status: DC

## 2018-02-15 NOTE — Progress Notes (Addendum)
Subjective:    Patient ID: Jeremiah Thomas, male    DOB: 1955/07/13, 62 y.o.   MRN: [id number]  HPI  Pt is a 62 yo male with HTN, hx of stroke, prostate cancer, T2DM, CKD who presents to the clinic for follow up and bilateral leg swelling. He does admit to laying and sitting more. No recent changes to medications but he did in the farely recent timeline of starting norvasc. Legs have been swelling for 2 to 3 weeks. Pt has had more SOB lately. Last echo 2017, normal EF.   Pt does have prostate cancer and undergoing treatment. Started flomax.   Pt is taking medications for DM but not checking sugars. Denies any hypoglycemic events. He states he is taking medications as prescribed.    .. Active Ambulatory Problems    Diagnosis Date Noted  . History of stroke 05/20/2016  . Asymptomatic hypertensive urgency 05/20/2016  . Acute kidney injury ([place]) 06/02/2016  . Essential hypertension, benign 06/02/2016  . CKD (chronic kidney disease) stage 3, GFR 30-59 ml/min (HCC) 06/03/2016  . Frequent urination at night 05/13/2017  . Elevated PSA 07/20/2017  . Elevated fasting glucose 07/20/2017  . Nasal congestion 08/15/2017  . Morbid obesity ([place]) 09/08/2017  . Type II diabetes mellitus, uncontrolled ([place]) 09/13/2017  . Prostate cancer ([place]) 10/22/2017  . Bilateral leg edema 02/21/2018  . Craving for particular food 02/21/2018  . Newly recognized heart murmur 02/21/2018  . 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   Resolved Ambulatory Problems    Diagnosis Date Noted  . No Resolved Ambulatory Problems   Past Medical History:  Diagnosis Date  . Hypertension       Review of Systems    see hpi Objective:   Physical Exam  Constitutional: He is oriented to person, place, and time. He appears well-developed and well-nourished.  Obese.   HENT:  Head: Normocephalic and atraumatic.  Cardiovascular: Normal rate and regular rhythm.  Murmur heard. 3/6 SEM.   Pulmonary/Chest: Effort normal and breath sounds  normal.  Neurological: He is alert and oriented to person, place, and time.  Skin:  Bilateral 2+ pitting edema with hyperpigmented scales.  Psychiatric: He has a normal mood and affect. His behavior is normal.          Assessment & Plan:  Marland KitchenMarland KitchenDiagnoses and all orders for this visit:  Essential hypertension, benign -     BASIC METABOLIC PANEL WITH GFR -     metoprolol tartrate (LOPRESSOR) 100 MG tablet; Take 1 tablet (100 mg total) by mouth 2 (two) times daily. -     hydrALAZINE (APRESOLINE) 100 MG tablet; Take 1 tablet (100 mg total) by mouth 2 (two) times daily.  Screening for diabetes mellitus -     POCT glycosylated hemoglobin (Hb A1C)  History of stroke -     atorvastatin (LIPITOR) 40 MG tablet; Take 1 tablet (40 mg total) by mouth daily.  Bilateral leg edema -     furosemide (LASIX) 20 MG tablet; Take 1 tablet (20 mg total) by mouth 2 (two) times daily. -     BASIC METABOLIC PANEL WITH GFR -     Brain natriuretic peptide  Craving for particular food -     CBC with Differential/Platelet -     Ferritin  Uncontrolled type 2 diabetes mellitus with hyperglycemia (HCC) -     Dulaglutide (TRULICITY) 0.86 PY/1.9JK SOPN; INJECT.75 ML UNDER THE SKIN ONCE A WEEK -     sitaGLIPtin (JANUVIA) 100 MG tablet; Take 1 tablet (  100 mg total) by mouth daily.  Prostate cancer ([place])  CKD (chronic kidney disease) stage 3, GFR 30-59 ml/min (HCC)  Newly recognized heart murmur  Ichthyosis vulgaris   .Marland Kitchen Results for orders placed or performed in visit on 70/96/28  BASIC METABOLIC PANEL WITH GFR  Result Value Ref Range   Glucose, Bld 102 (H) 65 - 99 mg/dL   BUN 18 7 - 25 mg/dL   Creat 1.96 (H) 0.70 - 1.25 mg/dL   GFR, Est Non African American 35 (L) > OR = 60 mL/min/1.86m2   GFR, Est African American 41 (L) > OR = 60 mL/min/1.36m2   BUN/Creatinine Ratio 9 6 - 22 (calc)   Sodium 143 135 - 146 mmol/L   Potassium 4.9 3.5 - 5.3 mmol/L   Chloride 108 98 - 110 mmol/L   CO2 29 20 - 32  mmol/L   Calcium 9.6 8.6 - 10.3 mg/dL  Brain natriuretic peptide  Result Value Ref Range   Brain Natriuretic Peptide 21 <100 pg/mL  CBC with Differential/Platelet  Result Value Ref Range   WBC 10.6 3.8 - 10.8 Thousand/uL   RBC 4.96 4.20 - 5.80 Million/uL   Hemoglobin 13.7 13.2 - 17.1 g/dL   HCT 42.6 38.5 - 50.0 %   MCV 85.9 80.0 - 100.0 fL   MCH 27.6 27.0 - 33.0 pg   MCHC 32.2 32.0 - 36.0 g/dL   RDW 14.0 11.0 - 15.0 %   Platelets 274 140 - 400 Thousand/uL   MPV 10.5 7.5 - 12.5 fL   Neutro Abs 6,127 1,500 - 7,800 cells/uL   Lymphs Abs 3,286 850 - 3,900 cells/uL   WBC mixed population 890 200 - 950 cells/uL   Eosinophils Absolute 148 15 - 500 cells/uL   Basophils Absolute 148 0 - 200 cells/uL   Neutrophils Relative % 57.8 %   Total Lymphocyte 31.0 %   Monocytes Relative 8.4 %   Eosinophils Relative 1.4 %   Basophils Relative 1.4 %  Ferritin  Result Value Ref Range   Ferritin 317 24 - 380 ng/mL  POCT glycosylated hemoglobin (Hb A1C)  Result Value Ref Range   Hemoglobin A1C 6.1 (A) 4.0 - 5.6 %   HbA1c POC (<> result, manual entry)     HbA1c, POC (prediabetic range)     HbA1c, POC (controlled diabetic range)     A!C looks great.  Continue on same medications.  Needs eye exam.  Declined flu and pneumonia vaccines.   Due to swelling stop norvasc. Start lasix. Consider compression stocking and feet elevation. Eucerin for moisturizer.  Concern BP will not be controlled follow up in 3 weeks.   New murmur. BNP good. Likely not CHF. Likely due ot BP. Echo done in 2017. Will hold on repeat echo.   Prostate cancer-managined by urology.

## 2018-02-15 NOTE — Patient Instructions (Addendum)
STOP norvasc. Recheck BP in 3 weeks.  Use eucerin as moisturizer over legs.   Chronic Venous Insufficiency Chronic venous insufficiency, also called venous stasis, is a condition that prevents blood from being pumped effectively through the veins in your legs. Blood may no longer be pumped effectively from the legs back to the heart. This condition can range from mild to severe. With proper treatment, you should be able to continue with an active life. What are the causes? Chronic venous insufficiency occurs when the vein walls become stretched, weakened, or damaged, or when valves within the vein are damaged. Some common causes of this include:  High blood pressure inside the veins (venous hypertension).  Increased blood pressure in the leg veins from long periods of sitting or standing.  A blood clot that blocks blood flow in a vein (deep vein thrombosis, DVT).  Inflammation of a vein (phlebitis) that causes a blood clot to form.  Tumors in the pelvis that cause blood to back up.  What increases the risk? The following factors may make you more likely to develop this condition:  Having a family history of this condition.  Obesity.  Pregnancy.  Living without enough physical activity or exercise (sedentary lifestyle).  Smoking.  Having a job that requires long periods of standing or sitting in one place.  Being a certain age. Women in their 60s and 56s and men in their 47s are more likely to develop this condition.  What are the signs or symptoms? Symptoms of this condition include:  Veins that are enlarged, bulging, or twisted (varicose veins).  Skin breakdown or ulcers.  Reddened or discolored skin on the front of the leg.  Brown, smooth, tight, and painful skin just above the ankle, usually on the inside of the leg (lipodermatosclerosis).  Swelling.  How is this diagnosed? This condition may be diagnosed based on:  Your medical history.  A physical  exam.  Tests, such as: ? A procedure that creates an image of a blood vessel and nearby organs and provides information about blood flow through the blood vessel (duplex ultrasound). ? A procedure that tests blood flow (plethysmography). ? A procedure to look at the veins using X-ray and dye (venogram).  How is this treated? The goals of treatment are to help you return to an active life and to minimize pain or disability. Treatment depends on the severity of your condition, and it may include:  Wearing compression stockings. These can help relieve symptoms and help prevent your condition from getting worse. However, they do not cure the condition.  Sclerotherapy. This is a procedure involving an injection of a material that "dissolves" damaged veins.  Surgery. This may involve: ? Removing a diseased vein (vein stripping). ? Cutting off blood flow through the vein (laser ablation surgery). ? Repairing a valve.  Follow these instructions at home:  Wear compression stockings as told by your health care provider. These stockings help to prevent blood clots and reduce swelling in your legs.  Take over-the-counter and prescription medicines only as told by your health care provider.  Stay active by exercising, walking, or doing different activities. Ask your health care provider what activities are safe for you and how much exercise you need.  Drink enough fluid to keep your urine clear or pale yellow.  Do not use any products that contain nicotine or tobacco, such as cigarettes and e-cigarettes. If you need help quitting, ask your health care provider.  Keep all follow-up visits as told  by your health care provider. This is important. Contact a health care provider if:  You have redness, swelling, or more pain in the affected area.  You see a red streak or line that extends up or down from the affected area.  You have skin breakdown or a loss of skin in the affected area, even if  the breakdown is small.  You get an injury in the affected area. Get help right away if:  You get an injury and an open wound in the affected area.  You have severe pain that does not get better with medicine.  You have sudden numbness or weakness in the foot or ankle below the affected area, or you have trouble moving your foot or ankle.  You have a fever and you have worse or persistent symptoms.  You have chest pain.  You have shortness of breath. Summary  Chronic venous insufficiency, also called venous stasis, is a condition that prevents blood from being pumped effectively through the veins in your legs.  Chronic venous insufficiency occurs when the vein walls become stretched, weakened, or damaged, or when valves within the vein are damaged.  Treatment for this condition depends on how severe your condition is, and it may involve wearing compression stockings or having a procedure.  Make sure you stay active by exercising, walking, or doing different activities. Ask your health care provider what activities are safe for you and how much exercise you need. This information is not intended to replace advice given to you by your health care provider. Make sure you discuss any questions you have with your health care provider. Document Released: 10/05/2006 Document Revised: 04/20/2016 Document Reviewed: 04/20/2016 Elsevier Interactive Patient Education  2017 [employer].

## 2018-02-16 LAB — CBC WITH DIFFERENTIAL/PLATELET
BASOS ABS: 148 {cells}/uL (ref 0–200)
Basophils Relative: 1.4 %
EOS ABS: 148 {cells}/uL (ref 15–500)
Eosinophils Relative: 1.4 %
HEMATOCRIT: 42.6 % (ref 38.5–50.0)
HEMOGLOBIN: 13.7 g/dL (ref 13.2–17.1)
LYMPHS ABS: 3286 {cells}/uL (ref 850–3900)
MCH: 27.6 pg (ref 27.0–33.0)
MCHC: 32.2 g/dL (ref 32.0–36.0)
MCV: 85.9 fL (ref 80.0–100.0)
MPV: 10.5 fL (ref 7.5–12.5)
Monocytes Relative: 8.4 %
Neutro Abs: 6127 cells/uL (ref 1500–7800)
Neutrophils Relative %: 57.8 %
Platelets: 274 10*3/uL (ref 140–400)
RBC: 4.96 10*6/uL (ref 4.20–5.80)
RDW: 14 % (ref 11.0–15.0)
Total Lymphocyte: 31 %
WBC: 10.6 10*3/uL (ref 3.8–10.8)
WBCMIX: 890 {cells}/uL (ref 200–950)

## 2018-02-16 LAB — BASIC METABOLIC PANEL WITH GFR
BUN/Creatinine Ratio: 9 (calc) (ref 6–22)
BUN: 18 mg/dL (ref 7–25)
CALCIUM: 9.6 mg/dL (ref 8.6–10.3)
CO2: 29 mmol/L (ref 20–32)
Chloride: 108 mmol/L (ref 98–110)
Creat: 1.96 mg/dL — ABNORMAL HIGH (ref 0.70–1.25)
GFR, EST AFRICAN AMERICAN: 41 mL/min/{1.73_m2} — AB (ref >=60)
GFR, Est Non African American: 35 mL/min/{1.73_m2} — ABNORMAL LOW (ref >=60)
GLUCOSE: 102 mg/dL — AB (ref 65–99)
Potassium: 4.9 mmol/L (ref 3.5–5.3)
Sodium: 143 mmol/L (ref 135–146)

## 2018-02-16 LAB — FERRITIN: Ferritin: 317 ng/mL (ref 24–380)

## 2018-02-16 LAB — BRAIN NATRIURETIC PEPTIDE: BRAIN NATRIURETIC PEPTIDE: 21 pg/mL (ref <100)

## 2018-02-16 NOTE — Progress Notes (Signed)
Call pt: kidney function worse likely BP making worse and maybe some dehydration. No signs of fluid overload from heart. WBC normal range. Iron stores good. hgb good.

## 2018-02-21 ENCOUNTER — Encounter: Payer: Self-pay | Admitting: Physician Assistant

## 2018-02-21 ENCOUNTER — Other Ambulatory Visit: Payer: Self-pay | Admitting: Physician Assistant

## 2018-02-21 DIAGNOSIS — R011 Cardiac murmur, unspecified: Secondary | ICD-10-CM | POA: Insufficient documentation

## 2018-02-21 DIAGNOSIS — R600 Localized edema: Secondary | ICD-10-CM | POA: Insufficient documentation

## 2018-02-21 DIAGNOSIS — R638 Other symptoms and signs concerning food and fluid intake: Secondary | ICD-10-CM | POA: Insufficient documentation

## 2018-02-21 DIAGNOSIS — I10 Essential (primary) hypertension: Secondary | ICD-10-CM

## 2018-02-21 DIAGNOSIS — Q800 Ichthyosis vulgaris: Secondary | ICD-10-CM

## 2018-02-21 DIAGNOSIS — R351 Nocturia: Secondary | ICD-10-CM

## 2018-02-21 HISTORY — DX: Localized edema: R60.0

## 2018-02-21 HISTORY — DX: Ichthyosis vulgaris: Q80.0

## 2018-02-21 HISTORY — DX: Other symptoms and signs concerning food and fluid intake: R63.8

## 2018-02-21 HISTORY — DX: Cardiac murmur, unspecified: R01.1

## 2018-03-08 ENCOUNTER — Encounter: Payer: Self-pay | Admitting: Physician Assistant

## 2018-03-08 ENCOUNTER — Ambulatory Visit (INDEPENDENT_AMBULATORY_CARE_PROVIDER_SITE_OTHER): Payer: BLUE CROSS/BLUE SHIELD | Admitting: Physician Assistant

## 2018-03-08 VITALS — BP 127/79 | HR 81 | Ht 73.0 in | Wt 301.0 lb

## 2018-03-08 DIAGNOSIS — R600 Localized edema: Secondary | ICD-10-CM

## 2018-03-08 DIAGNOSIS — N183 Chronic kidney disease, stage 3 unspecified: Secondary | ICD-10-CM

## 2018-03-08 DIAGNOSIS — I10 Essential (primary) hypertension: Secondary | ICD-10-CM

## 2018-03-08 MED ORDER — FUROSEMIDE 20 MG PO TABS: 20.0000 mg | ORAL_TABLET | Freq: Two times a day (BID) | ORAL | 5 refills | Status: DC

## 2018-03-08 NOTE — Progress Notes (Signed)
   Subjective:    Patient ID: Jeremiah Thomas, male    DOB: Jul 17, 1955, 62 y.o.   MRN: [id number]  HPI  Pt is a 62 yo  Obese male with uncontrolled HTN, T2DM, CKD 3, prostate cancer, hx of stroke who presents to the clinic to follow up on BP. Norvasc was stopped on 9/3. It does seemed to have helped with swelling in legs. He is now on metoprolol and hydralazine with lasix. He does feel better. He can walk and get around better. No CP, palpitations, headaches, dizziness, vision changes.   .. Active Ambulatory Problems    Diagnosis Date Noted  . History of stroke 05/20/2016  . Asymptomatic hypertensive urgency 05/20/2016  . Acute kidney injury ([place]) 06/02/2016  . Essential hypertension, benign 06/02/2016  . CKD (chronic kidney disease) stage 3, GFR 30-59 ml/min (HCC) 06/03/2016  . Frequent urination at night 05/13/2017  . Elevated PSA 07/20/2017  . Elevated fasting glucose 07/20/2017  . Nasal congestion 08/15/2017  . Morbid obesity ([place]) 09/08/2017  . Type II diabetes mellitus, uncontrolled ([place]) 09/13/2017  . Prostate cancer ([place]) 10/22/2017  . Bilateral leg edema 02/21/2018  . Craving for particular food 02/21/2018  . Newly recognized heart murmur 02/21/2018  . 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   Resolved Ambulatory Problems    Diagnosis Date Noted  . No Resolved Ambulatory Problems   Past Medical History:  Diagnosis Date  . Hypertension       Review of Systems    see HPI>  Objective:   Physical Exam  Constitutional: He is oriented to person, place, and time. He appears well-developed and well-nourished.  HENT:  Head: Normocephalic and atraumatic.  obese  Cardiovascular: Normal rate and regular rhythm.  Pulmonary/Chest: Effort normal and breath sounds normal.  Neurological: He is alert and oriented to person, place, and time.  Skin: No rash noted. He is not diaphoretic.  Psychiatric: His behavior is normal.  Flat affect.          Assessment & Plan:  Marland KitchenMarland KitchenDiagnoses and  all orders for this visit:  Essential hypertension, benign  CKD (chronic kidney disease) stage 3, GFR 30-59 ml/min (HCC) -     BASIC METABOLIC PANEL WITH GFR  Bilateral leg edema -     furosemide (LASIX) 20 MG tablet; Take 1 tablet (20 mg total) by mouth 2 (two) times daily.     .. Depression screen [facility] 2/9 03/08/2018 05/11/2017  Decreased Interest 1 1  Down, Depressed, Hopeless 1 1  PHQ - 2 Score 2 2  Altered sleeping 3 0  Tired, decreased energy 3 1  Change in appetite 2 1  Feeling bad or failure about yourself  1 1  Trouble concentrating 3 0  Moving slowly or fidgety/restless 1 1  Suicidal thoughts 0 0  PHQ-9 Score 15 6  Difficult doing work/chores Somewhat difficult -    Edema in legs much better. BP much better. Will recheck kidney function. Follow up in 2 months at DM follow up.

## 2018-03-09 LAB — BASIC METABOLIC PANEL WITH GFR
BUN / CREAT RATIO: 8 (calc) (ref 6–22)
BUN: 15 mg/dL (ref 7–25)
CHLORIDE: 106 mmol/L (ref 98–110)
CO2: 28 mmol/L (ref 20–32)
Calcium: 9.1 mg/dL (ref 8.6–10.3)
Creat: 1.97 mg/dL — ABNORMAL HIGH (ref 0.70–1.25)
GFR, EST AFRICAN AMERICAN: 41 mL/min/{1.73_m2} — AB (ref >=60)
GFR, Est Non African American: 35 mL/min/{1.73_m2} — ABNORMAL LOW (ref >=60)
Glucose, Bld: 98 mg/dL (ref 65–99)
Potassium: 4.3 mmol/L (ref 3.5–5.3)
SODIUM: 142 mmol/L (ref 135–146)

## 2018-03-09 NOTE — Progress Notes (Signed)
Call pt: still up but stable no change since 3 weeks ago. Will continue to monitor every 3 months.

## 2018-04-15 ENCOUNTER — Other Ambulatory Visit: Payer: Self-pay | Admitting: Physician Assistant

## 2018-04-15 DIAGNOSIS — R351 Nocturia: Secondary | ICD-10-CM

## 2018-04-19 ENCOUNTER — Other Ambulatory Visit: Payer: Self-pay | Admitting: Physician Assistant

## 2018-04-19 DIAGNOSIS — I10 Essential (primary) hypertension: Secondary | ICD-10-CM

## 2018-04-21 ENCOUNTER — Other Ambulatory Visit: Payer: Self-pay | Admitting: Physician Assistant

## 2018-04-21 DIAGNOSIS — R351 Nocturia: Secondary | ICD-10-CM

## 2018-05-04 ENCOUNTER — Other Ambulatory Visit: Payer: Self-pay | Admitting: Physician Assistant

## 2018-05-04 DIAGNOSIS — R600 Localized edema: Secondary | ICD-10-CM

## 2018-05-22 ENCOUNTER — Other Ambulatory Visit: Payer: Self-pay | Admitting: Physician Assistant

## 2018-05-22 DIAGNOSIS — I10 Essential (primary) hypertension: Secondary | ICD-10-CM

## 2018-05-22 DIAGNOSIS — R0981 Nasal congestion: Secondary | ICD-10-CM

## 2018-05-26 ENCOUNTER — Other Ambulatory Visit: Payer: Self-pay | Admitting: Physician Assistant

## 2018-05-26 DIAGNOSIS — I10 Essential (primary) hypertension: Secondary | ICD-10-CM

## 2018-06-03 ENCOUNTER — Other Ambulatory Visit: Payer: Self-pay | Admitting: Physician Assistant

## 2018-06-03 DIAGNOSIS — R351 Nocturia: Secondary | ICD-10-CM

## 2018-06-06 ENCOUNTER — Encounter: Payer: Self-pay | Admitting: Physician Assistant

## 2018-06-06 ENCOUNTER — Ambulatory Visit (INDEPENDENT_AMBULATORY_CARE_PROVIDER_SITE_OTHER): Payer: BLUE CROSS/BLUE SHIELD | Admitting: Physician Assistant

## 2018-06-06 VITALS — BP 136/77 | HR 88 | Ht 73.0 in | Wt 314.0 lb

## 2018-06-06 DIAGNOSIS — I10 Essential (primary) hypertension: Secondary | ICD-10-CM | POA: Diagnosis not present

## 2018-06-06 DIAGNOSIS — R5383 Other fatigue: Secondary | ICD-10-CM

## 2018-06-06 DIAGNOSIS — R351 Nocturia: Secondary | ICD-10-CM | POA: Diagnosis not present

## 2018-06-06 DIAGNOSIS — R0683 Snoring: Secondary | ICD-10-CM

## 2018-06-06 DIAGNOSIS — N183 Chronic kidney disease, stage 3 unspecified: Secondary | ICD-10-CM

## 2018-06-06 DIAGNOSIS — C61 Malignant neoplasm of prostate: Secondary | ICD-10-CM

## 2018-06-06 DIAGNOSIS — E1165 Type 2 diabetes mellitus with hyperglycemia: Secondary | ICD-10-CM | POA: Diagnosis not present

## 2018-06-06 DIAGNOSIS — G478 Other sleep disorders: Secondary | ICD-10-CM

## 2018-06-06 LAB — POCT GLYCOSYLATED HEMOGLOBIN (HGB A1C): Hemoglobin A1C: 6.5 % — AB (ref 4.0–5.6)

## 2018-06-06 MED ORDER — HYDRALAZINE HCL 100 MG PO TABS: 100.0000 mg | ORAL_TABLET | Freq: Two times a day (BID) | ORAL | 1 refills | Status: DC

## 2018-06-06 MED ORDER — METOPROLOL TARTRATE 100 MG PO TABS: 100.0000 mg | ORAL_TABLET | Freq: Two times a day (BID) | ORAL | 1 refills | Status: DC

## 2018-06-06 MED ORDER — SITAGLIPTIN PHOSPHATE 100 MG PO TABS: 100.0000 mg | ORAL_TABLET | Freq: Every day | ORAL | 1 refills | Status: DC

## 2018-06-06 MED ORDER — TAMSULOSIN HCL 0.4 MG PO CAPS: 0.8000 mg | ORAL_CAPSULE | Freq: Every day | ORAL | 5 refills | Status: DC

## 2018-06-06 MED ORDER — FINASTERIDE 5 MG PO TABS: 5.0000 mg | ORAL_TABLET | Freq: Every day | ORAL | 5 refills | Status: DC

## 2018-06-06 NOTE — Progress Notes (Signed)
amb   Subjective:    Patient ID: Jeremiah Thomas, male    DOB: 05/12/1956, 62 y.o.   MRN: [id number]  HPI Pt is a 62 yo male with prostate cancer, T2DM, HTN, CKD who presents to the clinic for 3 month follow up.   DM- he is not checking sugars. He trying to eat the right things. He is not exercising. No open sores or wounds.   HTN- no CP, palpitations, headaches, or vision changes. Taking medication daily. Not checking pressures at home.   He continues to be very tired. He does have to get up 3-5 times a night to urinate. He has prostate cancer but only treating with flomax. He states that urologist will not see him right now because he has a bill.  He is obese and snores.   .. Active Ambulatory Problems    Diagnosis Date Noted  . History of stroke 05/20/2016  . Asymptomatic hypertensive urgency 05/20/2016  . Acute kidney injury (Jeremiah Thomas) 06/02/2016  . Essential hypertension, benign 06/02/2016  . CKD (chronic kidney disease) stage 3, GFR 30-59 ml/min (HCC) 06/03/2016  . Frequent urination at night 05/13/2017  . Elevated PSA 07/20/2017  . Elevated fasting glucose 07/20/2017  . Nasal congestion 08/15/2017  . Morbid obesity (Jeremiah Thomas) 09/08/2017  . Type II diabetes mellitus, uncontrolled ([place]) 09/13/2017  . Prostate cancer (Jeremiah Thomas) 10/22/2017  . Bilateral leg edema 02/21/2018  . Craving for particular food 02/21/2018  . Newly recognized heart murmur 02/21/2018  . 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   Resolved Ambulatory Problems    Diagnosis Date Noted  . No Resolved Ambulatory Problems   Past Medical History:  Diagnosis Date  . Hypertension    .Marland Kitchen Family History  Problem Relation Age of Onset  . Cancer Mother        ovarian  . Hypertension Father   . Diabetes Father   . Stroke Father      Review of Systems See HPI.     Objective:   Physical Exam Vitals signs reviewed.  Constitutional:      Appearance: Normal appearance.  HENT:     Head: Normocephalic and atraumatic.     Right  Ear: Tympanic membrane normal.     Left Ear: Tympanic membrane normal.  Cardiovascular:     Rate and Rhythm: Normal rate and regular rhythm.  Pulmonary:     Effort: Pulmonary effort is normal.     Breath sounds: Normal breath sounds.  Neurological:     General: No focal deficit present.     Mental Status: He is alert and oriented to person, place, and time.  Psychiatric:        Mood and Affect: Mood normal.        Behavior: Behavior normal.           Assessment & Plan:  .Marland KitchenJohn was seen today for follow-up.  Diagnoses and all orders for this visit:  Uncontrolled type 2 diabetes mellitus with hyperglycemia (Jeremiah Thomas) -     POCT glycosylated hemoglobin (Hb A1C) -     sitaGLIPtin (JANUVIA) 100 MG tablet; Take 1 tablet (100 mg total) by mouth daily.  Frequent urination at night -     tamsulosin (FLOMAX) 0.4 MG CAPS capsule; Take 2 capsules (0.8 mg total) by mouth daily. -     finasteride (PROSCAR) 5 MG tablet; Take 1 tablet (5 mg total) by mouth daily.  Essential hypertension, benign -     metoprolol tartrate (LOPRESSOR) 100 MG tablet; Take 1 tablet (100 mg  total) by mouth 2 (two) times daily. (BETA BLOCKER) -     hydrALAZINE (APRESOLINE) 100 MG tablet; Take 1 tablet (100 mg total) by mouth 2 (two) times daily.  No energy -     BASIC METABOLIC PANEL WITH GFR -     Testosterone  CKD (chronic kidney disease) stage 3, GFR 30-59 ml/min (HCC) -     BASIC METABOLIC PANEL WITH GFR  Snoring -     Home sleep test  Non-restorative sleep -     Home sleep test  Morbidly obese (HCC) -     Home sleep test  Prostate cancer (Jeremiah Thomas) -     tamsulosin (FLOMAX) 0.4 MG CAPS capsule; Take 2 capsules (0.8 mg total) by mouth daily. -     finasteride (PROSCAR) 5 MG tablet; Take 1 tablet (5 mg total) by mouth daily.  .. Results for orders placed or performed in visit on 46/27/03  BASIC METABOLIC PANEL WITH GFR  Result Value Ref Range   Glucose, Bld 111 (H) 65 - 99 mg/dL   BUN 18 7 - 25 mg/dL    Creat 1.97 (H) 0.70 - 1.25 mg/dL   GFR, Est Non African American 35 (L) > OR = 60 mL/min/1.38m2   GFR, Est African American 41 (L) > OR = 60 mL/min/1.54m2   BUN/Creatinine Ratio 9 6 - 22 (calc)   Sodium 142 135 - 146 mmol/L   Potassium 4.4 3.5 - 5.3 mmol/L   Chloride 103 98 - 110 mmol/L   CO2 30 20 - 32 mmol/L   Calcium 9.4 8.6 - 10.3 mg/dL  Testosterone  Result Value Ref Range   Testosterone 315 250 - 827 ng/dL  POCT glycosylated hemoglobin (Hb A1C)  Result Value Ref Range   Hemoglobin A1C 6.5 (A) 4.0 - 5.6 %   HbA1c POC (<> result, manual entry)     HbA1c, POC (prediabetic range)     HbA1c, POC (controlled diabetic range)      2nd recheck BP was much better. Added prostate medication, finasteride,  to help with urinary symptoms.   Fatigue- start vitamin D 1000 units and b12 1076mcg. StOP bang high risk. Pt ok to do home study. Fatigue is likely multifactorial.   A!C looks great.  Continue on same medication. On ARB.  On statin.  Reminder to get eye exam.

## 2018-06-06 NOTE — Patient Instructions (Addendum)
Home sleep study.  Continue on same BP and diabetic medication.  Finesteride added.  Vitamin D 2000 units a day.  B12 1041mcg a day.

## 2018-06-07 LAB — BASIC METABOLIC PANEL WITH GFR
BUN/Creatinine Ratio: 9 (calc) (ref 6–22)
BUN: 18 mg/dL (ref 7–25)
CALCIUM: 9.4 mg/dL (ref 8.6–10.3)
CO2: 30 mmol/L (ref 20–32)
Chloride: 103 mmol/L (ref 98–110)
Creat: 1.97 mg/dL — ABNORMAL HIGH (ref 0.70–1.25)
GFR, Est African American: 41 mL/min/{1.73_m2} — ABNORMAL LOW (ref >=60)
GFR, Est Non African American: 35 mL/min/{1.73_m2} — ABNORMAL LOW (ref >=60)
Glucose, Bld: 111 mg/dL — ABNORMAL HIGH (ref 65–99)
Potassium: 4.4 mmol/L (ref 3.5–5.3)
Sodium: 142 mmol/L (ref 135–146)

## 2018-06-07 LAB — TESTOSTERONE: Testosterone: 315 ng/dL (ref 250–827)

## 2018-06-09 NOTE — Progress Notes (Signed)
Call pt: kidney function stable. Testosterone normal range.

## 2018-06-12 ENCOUNTER — Encounter: Payer: Self-pay | Admitting: Physician Assistant

## 2018-06-13 DIAGNOSIS — R0683 Snoring: Secondary | ICD-10-CM

## 2018-06-13 DIAGNOSIS — R5383 Other fatigue: Secondary | ICD-10-CM

## 2018-06-13 DIAGNOSIS — G478 Other sleep disorders: Secondary | ICD-10-CM

## 2018-06-13 HISTORY — DX: Other sleep disorders: G47.8

## 2018-06-13 HISTORY — DX: Snoring: R06.83

## 2018-06-13 HISTORY — DX: Other fatigue: R53.83

## 2018-06-19 ENCOUNTER — Other Ambulatory Visit: Payer: Self-pay | Admitting: Physician Assistant

## 2018-06-19 DIAGNOSIS — R0981 Nasal congestion: Secondary | ICD-10-CM

## 2018-06-20 ENCOUNTER — Other Ambulatory Visit: Payer: Self-pay | Admitting: Physician Assistant

## 2018-06-20 DIAGNOSIS — I10 Essential (primary) hypertension: Secondary | ICD-10-CM

## 2018-07-07 ENCOUNTER — Other Ambulatory Visit: Payer: Self-pay | Admitting: Physician Assistant

## 2018-07-07 DIAGNOSIS — C61 Malignant neoplasm of prostate: Secondary | ICD-10-CM

## 2018-07-07 DIAGNOSIS — R351 Nocturia: Secondary | ICD-10-CM

## 2018-08-01 ENCOUNTER — Other Ambulatory Visit: Payer: Self-pay | Admitting: Physician Assistant

## 2018-08-01 DIAGNOSIS — E1165 Type 2 diabetes mellitus with hyperglycemia: Secondary | ICD-10-CM

## 2018-08-09 ENCOUNTER — Ambulatory Visit (HOSPITAL_BASED_OUTPATIENT_CLINIC_OR_DEPARTMENT_OTHER): Payer: BLUE CROSS/BLUE SHIELD | Admitting: Internal Medicine

## 2018-08-09 VITALS — Ht 74.0 in | Wt 320.0 lb

## 2018-08-09 DIAGNOSIS — G4733 Obstructive sleep apnea (adult) (pediatric): Secondary | ICD-10-CM | POA: Insufficient documentation

## 2018-08-10 ENCOUNTER — Ambulatory Visit (HOSPITAL_BASED_OUTPATIENT_CLINIC_OR_DEPARTMENT_OTHER): Payer: BLUE CROSS/BLUE SHIELD | Attending: Physician Assistant | Admitting: Internal Medicine

## 2018-08-18 ENCOUNTER — Other Ambulatory Visit: Payer: Self-pay | Admitting: Physician Assistant

## 2018-08-18 DIAGNOSIS — E1165 Type 2 diabetes mellitus with hyperglycemia: Secondary | ICD-10-CM

## 2018-08-18 DIAGNOSIS — I10 Essential (primary) hypertension: Secondary | ICD-10-CM

## 2018-08-20 DIAGNOSIS — G4733 Obstructive sleep apnea (adult) (pediatric): Secondary | ICD-10-CM | POA: Diagnosis not present

## 2018-08-20 NOTE — Procedures (Signed)
   Patient Name: Jeremiah Thomas, Jeremiah Thomas Date: 08/10/2018 Gender: Male D.O.B: [birth year] Age (years): 62 Referring Provider: Iran Planas Height (inches): 74 Interpreting Physician: Baird Lyons MD, ABSM Weight (lbs): 320 RPSGT: [place], Holly BMI: 41 MRN: [id number] Neck Size: 18.50  CLINICAL INFORMATION Sleep Study Type: HST Indication for sleep study: OSA Epworth Sleepiness Score: 15  SLEEP STUDY TECHNIQUE A multi-channel overnight portable sleep study was performed. The channels recorded were: nasal airflow, thoracic respiratory movement, and oxygen saturation with a pulse oximetry. Snoring was also monitored.  MEDICATIONS Patient self administered medications include: none reported.  SLEEP ARCHITECTURE Patient was studied for 411.9 minutes. The sleep efficiency was 100.0 % and the patient was supine for 97.2%. The arousal index was 0.0 per hour.  RESPIRATORY PARAMETERS The overall AHI was 53.2 per hour, with a central apnea index of 0.0 per hour. The oxygen nadir was 72% during sleep.  CARDIAC DATA Mean heart rate during sleep was 82.3 bpm.  IMPRESSIONS - Severe obstructive sleep apnea occurred during this study (AHI = 53.2/h). - No significant central sleep apnea occurred during this study (CAI = 0.0/h). - Oxygen desaturation was noted during this study (Min O2 = 72%).Mean sat 95%. - Patient snored.  DIAGNOSIS - Obstructive Sleep Apnea (327.23 [G47.33 ICD-10])  RECOMMENDATIONS - Suggest CPAP titration sleep study or autopap. Other options would be based on clinical judgment. - Be careful with alcohol, sedatives and other CNS depressants that may worsen sleep apnea and disrupt normal sleep architecture. - Sleep hygiene should be reviewed to assess factors that may improve sleep quality. - Weight management and regular exercise should be initiated or continued.  [Electronically signed] 08/20/2018 11:36 AM  Baird Lyons MD, ABSM Diplomate, American Board of  Sleep Medicine   NPI: [id number]                          [facility]  ELECTRONICALLY SIGNED ON:  08/20/2018, 11:29 AM [place] PH: [phone]   FX: [phone] [place]

## 2018-08-22 ENCOUNTER — Encounter: Payer: Self-pay | Admitting: Physician Assistant

## 2018-08-22 DIAGNOSIS — G4733 Obstructive sleep apnea (adult) (pediatric): Secondary | ICD-10-CM

## 2018-08-22 HISTORY — DX: Obstructive sleep apnea (adult) (pediatric): G47.33

## 2018-08-22 MED ORDER — AMBULATORY NON FORMULARY MEDICATION: 0 refills | Status: DC

## 2018-08-22 NOTE — Progress Notes (Signed)
Call pt: home sleep study shows severe obstructive apnea. You need a CPAP. I am going to get order for you to be on auto pap and follow up with me in 1 month after starting CPAP. Are you ok with this?

## 2018-08-22 NOTE — Addendum Note (Signed)
Addended by: [name] on: 08/22/2018 07:11 AM   Modules accepted: Orders

## 2018-09-05 ENCOUNTER — Telehealth: Payer: Self-pay

## 2018-09-05 NOTE — Telephone Encounter (Signed)
Jeremiah Thomas from [facility] called stating that the RX they received for pt's CPAP did not have settings/ranges specifically listed on RX.   I have pended RX for you to add this info so I can fax back to [facility] at [phone]

## 2018-09-06 ENCOUNTER — Ambulatory Visit (INDEPENDENT_AMBULATORY_CARE_PROVIDER_SITE_OTHER): Payer: BLUE CROSS/BLUE SHIELD | Admitting: Physician Assistant

## 2018-09-06 ENCOUNTER — Encounter: Payer: Self-pay | Admitting: Physician Assistant

## 2018-09-06 ENCOUNTER — Other Ambulatory Visit: Payer: Self-pay

## 2018-09-06 VITALS — BP 158/96 | HR 109 | Wt 327.0 lb

## 2018-09-06 DIAGNOSIS — N183 Chronic kidney disease, stage 3 unspecified: Secondary | ICD-10-CM

## 2018-09-06 DIAGNOSIS — G4733 Obstructive sleep apnea (adult) (pediatric): Secondary | ICD-10-CM

## 2018-09-06 DIAGNOSIS — E1165 Type 2 diabetes mellitus with hyperglycemia: Secondary | ICD-10-CM | POA: Diagnosis not present

## 2018-09-06 DIAGNOSIS — I10 Essential (primary) hypertension: Secondary | ICD-10-CM

## 2018-09-06 DIAGNOSIS — R202 Paresthesia of skin: Secondary | ICD-10-CM

## 2018-09-06 DIAGNOSIS — R972 Elevated prostate specific antigen [PSA]: Secondary | ICD-10-CM

## 2018-09-06 DIAGNOSIS — Z8673 Personal history of transient ischemic attack (TIA), and cerebral infarction without residual deficits: Secondary | ICD-10-CM

## 2018-09-06 DIAGNOSIS — H81399 Other peripheral vertigo, unspecified ear: Secondary | ICD-10-CM

## 2018-09-06 DIAGNOSIS — R29818 Other symptoms and signs involving the nervous system: Secondary | ICD-10-CM

## 2018-09-06 DIAGNOSIS — R42 Dizziness and giddiness: Secondary | ICD-10-CM

## 2018-09-06 LAB — COMPLETE METABOLIC PANEL WITH GFR
AG Ratio: 1.4 (calc) (ref 1.0–2.5)
ALT: 27 U/L (ref 9–46)
AST: 18 U/L (ref 10–35)
Albumin: 4.1 g/dL (ref 3.6–5.1)
Alkaline phosphatase (APISO): 67 U/L (ref 35–144)
BUN/Creatinine Ratio: 10 (calc) (ref 6–22)
BUN: 19 mg/dL (ref 7–25)
CO2: 28 mmol/L (ref 20–32)
Calcium: 9.6 mg/dL (ref 8.6–10.3)
Chloride: 106 mmol/L (ref 98–110)
Creat: 1.97 mg/dL — ABNORMAL HIGH (ref 0.70–1.25)
GFR, Est African American: 41 mL/min/{1.73_m2} — ABNORMAL LOW (ref >=60)
GFR, Est Non African American: 35 mL/min/{1.73_m2} — ABNORMAL LOW (ref >=60)
GLUCOSE: 153 mg/dL — AB (ref 65–99)
Globulin: 2.9 g/dL (calc) (ref 1.9–3.7)
Potassium: 4.8 mmol/L (ref 3.5–5.3)
Sodium: 141 mmol/L (ref 135–146)
Total Bilirubin: 0.3 mg/dL (ref 0.2–1.2)
Total Protein: 7 g/dL (ref 6.1–8.1)

## 2018-09-06 LAB — POCT GLYCOSYLATED HEMOGLOBIN (HGB A1C): Hemoglobin A1C: 6.8 % — AB (ref 4.0–5.6)

## 2018-09-06 LAB — LIPID PANEL W/REFLEX DIRECT LDL
Cholesterol: 120 mg/dL (ref <200)
HDL: 46 mg/dL (ref >=40)
LDL Cholesterol (Calc): 51 mg/dL (calc)
NON-HDL CHOLESTEROL (CALC): 74 mg/dL (ref <130)
Total CHOL/HDL Ratio: 2.6 (calc) (ref <5.0)
Triglycerides: 146 mg/dL (ref <150)

## 2018-09-06 LAB — PSA: PSA: 3.6 ng/mL (ref <=4.0)

## 2018-09-06 MED ORDER — AMLODIPINE BESYLATE 5 MG PO TABS: 5.0000 mg | ORAL_TABLET | Freq: Every day | ORAL | 1 refills | Status: DC

## 2018-09-06 MED ORDER — DULAGLUTIDE 1.5 MG/0.5ML ~~LOC~~ SOAJ: 1.5000 mg | SUBCUTANEOUS | 2 refills | Status: DC

## 2018-09-06 NOTE — Progress Notes (Signed)
Subjective:    Patient ID: Jeremiah Thomas, male    DOB: August 28, 1955, 63 y.o.   MRN: [id number]  HPI  Pt is a 63 yo male with uncontrolled T2DM, HTN, OSA, CKD, hx of stroke who presents to the clinic for follow up.   DM- he is checking sugars some and ranging in the 120's to 160 in the am. He is not keeping to a diet but trying to avoid some carbs and sugars. No hypoglycemic events. On trulicity without complaints. He does admit to blurry vision.   HTN- he is checking BP at home and always better than "in the doctors office". He reports 120's to 150's at home. He denies any CP, SOB, headaches. His last carotid doppler was 05/2016. He does admit to some new paresthesia where the left side of his face went numb and he felt dizzy about 3 weeks ago. Dizziness has resolved but still has numbness and tingling of the left side of face. No problems swallowing or speaking. No weakness in upper or lower extremity. He does have a hx of stroke many years ago.   He admits he is not using his CPAP.   .. Active Ambulatory Problems    Diagnosis Date Noted  . History of stroke 05/20/2016  . Asymptomatic hypertensive urgency 05/20/2016  . Acute kidney injury ([place]) 06/02/2016  . Essential hypertension, benign 06/02/2016  . CKD (chronic kidney disease) stage 3, GFR 30-59 ml/min (HCC) 06/03/2016  . Frequent urination at night 05/13/2017  . Elevated PSA 07/20/2017  . Elevated fasting glucose 07/20/2017  . Nasal congestion 08/15/2017  . Morbidly obese ([place]) 09/08/2017  . Type II diabetes mellitus, uncontrolled ([place]) 09/13/2017  . Prostate cancer ([place]) 10/22/2017  . Bilateral leg edema 02/21/2018  . Craving for particular food 02/21/2018  . Newly recognized heart murmur 02/21/2018  . 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  . Non-restorative sleep 06/13/2018  . Snoring 06/13/2018  . No energy 06/13/2018  . OSA (obstructive sleep apnea) 08/22/2018  . Paresthesias 09/12/2018  . Dizziness 09/12/2018   Resolved  Ambulatory Problems    Diagnosis Date Noted  . No Resolved Ambulatory Problems   Past Medical History:  Diagnosis Date  . Hypertension           Review of Systems    see HPI.  Objective:   Physical Exam Vitals signs reviewed.  Constitutional:      Appearance: Normal appearance. He is obese.  Cardiovascular:     Rate and Rhythm: Normal rate and regular rhythm.     Heart sounds: Murmur present.  Pulmonary:     Effort: Pulmonary effort is normal.     Breath sounds: Normal breath sounds.  Neurological:     General: No focal deficit present.     Mental Status: He is alert and oriented to person, place, and time.  Psychiatric:        Mood and Affect: Mood normal.           Assessment & Plan:  .Marland KitchenJohn was seen today for follow-up.  Diagnoses and all orders for this visit:  Uncontrolled type 2 diabetes mellitus with hyperglycemia ([place]) -     POCT HgB A1C -     Dulaglutide (TRULICITY) 1.5 EQ/6.8TM SOPN; Inject 1.5 mg into the skin once a week. -     COMPLETE METABOLIC PANEL WITH GFR  Essential hypertension, benign -     Lipid Panel w/reflex Direct LDL -     amLODipine (NORVASC) 5 MG tablet; Take 1  tablet (5 mg total) by mouth daily.  OSA (obstructive sleep apnea)  CKD (chronic kidney disease) stage 3, GFR 30-59 ml/min (HCC)  History of stroke -     Lipid Panel w/reflex Direct LDL -     COMPLETE METABOLIC PANEL WITH GFR  Elevated PSA -     PSA  Paresthesias  Dizziness  Other symptoms and signs involving the nervous system -     MR Brain W Wo Contrast  Other peripheral vertigo, unspecified ear -     MR Brain W Wo Contrast     .. Results for orders placed or performed in visit on 09/06/18  Lipid Panel w/reflex Direct LDL  Result Value Ref Range   Cholesterol 120 <200 mg/dL   HDL 46 > OR = 40 mg/dL   Triglycerides 146 <150 mg/dL   LDL Cholesterol (Calc) 51 mg/dL (calc)   Total CHOL/HDL Ratio 2.6 <5.0 (calc)   Non-HDL Cholesterol (Calc) 74 <130  mg/dL (calc)  COMPLETE METABOLIC PANEL WITH GFR  Result Value Ref Range   Glucose, Bld 153 (H) 65 - 99 mg/dL   BUN 19 7 - 25 mg/dL   Creat 1.97 (H) 0.70 - 1.25 mg/dL   GFR, Est Non African American 35 (L) > OR = 60 mL/min/1.78m2   GFR, Est African American 41 (L) > OR = 60 mL/min/1.46m2   BUN/Creatinine Ratio 10 6 - 22 (calc)   Sodium 141 135 - 146 mmol/L   Potassium 4.8 3.5 - 5.3 mmol/L   Chloride 106 98 - 110 mmol/L   CO2 28 20 - 32 mmol/L   Calcium 9.6 8.6 - 10.3 mg/dL   Total Protein 7.0 6.1 - 8.1 g/dL   Albumin 4.1 3.6 - 5.1 g/dL   Globulin 2.9 1.9 - 3.7 g/dL (calc)   AG Ratio 1.4 1.0 - 2.5 (calc)   Total Bilirubin 0.3 0.2 - 1.2 mg/dL   Alkaline phosphatase (APISO) 67 35 - 144 U/L   AST 18 10 - 35 U/L   ALT 27 9 - 46 U/L  PSA  Result Value Ref Range   PSA 3.6 < OR = 4.0 ng/mL  POCT HgB A1C  Result Value Ref Range   Hemoglobin A1C 6.8 (A) 4.0 - 5.6 %   HbA1c POC (<> result, manual entry)     HbA1c, POC (prediabetic range)     HbA1c, POC (controlled diabetic range)     A!C looks great.  Continue on trulicity.  On lipitor.  On ArB.  Needs eye exam.  Follow up in 3 months.   BP is NOT controlled. Discussed why BP control is so important. Started on norvasc 5mg  daily with other BP medications. Keep BP at home and report readings.   Concerned about TIA due to stroke hx of a DM/HTN. Increased norvasc for BP control. DM is controlled today. On statin. Last carotid was 2017 no significant stenosis. Will get MRI of brain.   Not checked PSA in a while. Will recheck. Not currently following up with urology. flomax seems to be working well.   Follow up in 4 weeks to discuss BP and MRI results.   Marland Kitchen.Spent 30 minutes with patient and greater than 50 percent of visit spent counseling patient regarding treatment plan.

## 2018-09-06 NOTE — Telephone Encounter (Signed)
Sleep study results stated use autopap can that not be a used as a setting?

## 2018-09-07 MED ORDER — AMBULATORY NON FORMULARY MEDICATION: 0 refills | Status: AC

## 2018-09-07 MED ORDER — AMBULATORY NON FORMULARY MEDICATION: 0 refills | Status: DC

## 2018-09-07 NOTE — Telephone Encounter (Signed)
Ok printed ready to fax.

## 2018-09-07 NOTE — Telephone Encounter (Signed)
faxed

## 2018-09-07 NOTE — Progress Notes (Signed)
Call pt: cholesterol looks fabulous.  Kidney function stable.  PSA went down GREAT news.

## 2018-09-07 NOTE — Telephone Encounter (Signed)
Spoke with [facility], Jeanett Schlein, states that autopap still has to have a range listed..  States usually the range runs between 5-15 or 5-20.Marland Kitchen

## 2018-09-12 ENCOUNTER — Telehealth: Payer: Self-pay | Admitting: Physician Assistant

## 2018-09-12 DIAGNOSIS — R29818 Other symptoms and signs involving the nervous system: Secondary | ICD-10-CM

## 2018-09-12 DIAGNOSIS — Z8673 Personal history of transient ischemic attack (TIA), and cerebral infarction without residual deficits: Secondary | ICD-10-CM

## 2018-09-12 DIAGNOSIS — R42 Dizziness and giddiness: Secondary | ICD-10-CM

## 2018-09-12 DIAGNOSIS — R202 Paresthesia of skin: Secondary | ICD-10-CM | POA: Insufficient documentation

## 2018-09-12 DIAGNOSIS — H81399 Other peripheral vertigo, unspecified ear: Secondary | ICD-10-CM

## 2018-09-12 HISTORY — DX: Dizziness and giddiness: R42

## 2018-09-12 HISTORY — DX: Paresthesia of skin: R20.2

## 2018-09-12 NOTE — Telephone Encounter (Signed)
Order changed per PCP request

## 2018-12-07 ENCOUNTER — Ambulatory Visit (INDEPENDENT_AMBULATORY_CARE_PROVIDER_SITE_OTHER): Payer: BLUE CROSS/BLUE SHIELD | Admitting: Physician Assistant

## 2018-12-07 ENCOUNTER — Encounter: Payer: Self-pay | Admitting: Physician Assistant

## 2018-12-07 VITALS — BP 142/82 | HR 88 | Temp 98.2°F | Ht 74.0 in | Wt 331.0 lb

## 2018-12-07 DIAGNOSIS — N183 Chronic kidney disease, stage 3 unspecified: Secondary | ICD-10-CM

## 2018-12-07 DIAGNOSIS — R351 Nocturia: Secondary | ICD-10-CM

## 2018-12-07 DIAGNOSIS — E1165 Type 2 diabetes mellitus with hyperglycemia: Secondary | ICD-10-CM | POA: Diagnosis not present

## 2018-12-07 DIAGNOSIS — I951 Orthostatic hypotension: Secondary | ICD-10-CM

## 2018-12-07 DIAGNOSIS — R600 Localized edema: Secondary | ICD-10-CM

## 2018-12-07 DIAGNOSIS — I10 Essential (primary) hypertension: Secondary | ICD-10-CM

## 2018-12-07 DIAGNOSIS — C61 Malignant neoplasm of prostate: Secondary | ICD-10-CM

## 2018-12-07 HISTORY — DX: Orthostatic hypotension: I95.1

## 2018-12-07 LAB — POCT GLYCOSYLATED HEMOGLOBIN (HGB A1C): Hemoglobin A1C: 7 % — AB (ref 4.0–5.6)

## 2018-12-07 MED ORDER — FINASTERIDE 5 MG PO TABS: 5.0000 mg | ORAL_TABLET | Freq: Every day | ORAL | 5 refills | Status: DC

## 2018-12-07 MED ORDER — TAMSULOSIN HCL 0.4 MG PO CAPS: 0.8000 mg | ORAL_CAPSULE | Freq: Every day | ORAL | 5 refills | Status: DC

## 2018-12-07 MED ORDER — HYDRALAZINE HCL 100 MG PO TABS: 100.0000 mg | ORAL_TABLET | Freq: Two times a day (BID) | ORAL | 1 refills | Status: DC

## 2018-12-07 MED ORDER — AMLODIPINE BESYLATE 5 MG PO TABS: 5.0000 mg | ORAL_TABLET | Freq: Every day | ORAL | 3 refills | Status: DC

## 2018-12-07 MED ORDER — SITAGLIPTIN PHOSPHATE 100 MG PO TABS: 100.0000 mg | ORAL_TABLET | Freq: Every day | ORAL | 1 refills | Status: DC

## 2018-12-07 MED ORDER — FUROSEMIDE 20 MG PO TABS: 20.0000 mg | ORAL_TABLET | Freq: Two times a day (BID) | ORAL | 5 refills | Status: DC

## 2018-12-07 MED ORDER — TRULICITY 1.5 MG/0.5ML ~~LOC~~ SOAJ: 1.5000 mg | SUBCUTANEOUS | 2 refills | Status: DC

## 2018-12-07 MED ORDER — METOPROLOL TARTRATE 100 MG PO TABS: 100.0000 mg | ORAL_TABLET | Freq: Two times a day (BID) | ORAL | 1 refills | Status: DC

## 2018-12-07 NOTE — Patient Instructions (Addendum)
b12 sublingual.  Vitamin D 1000 units.   Lasix to as needed.   Orthostatic Hypotension Blood pressure is a measurement of how strongly, or weakly, your blood is pressing against the walls of your arteries. Orthostatic hypotension is a sudden drop in blood pressure that happens when you quickly change positions, such as when you get up from sitting or lying down. Arteries are blood vessels that carry blood from your heart throughout your body. When blood pressure is too low, you may not get enough blood to your brain or to the rest of your organs. This can cause weakness, light-headedness, rapid heartbeat, and fainting. This can last for just a few seconds or for up to a few minutes. Orthostatic hypotension is usually not a serious problem. However, if it happens frequently or gets worse, it may be a sign of something more serious. What are the causes? This condition may be caused by:  Sudden changes in posture, such as standing up quickly after you have been sitting or lying down.  Blood loss.  Loss of body fluids (dehydration).  Heart problems.  Hormone (endocrine) problems.  Pregnancy.  Severe infection.  Lack of certain nutrients.  Severe allergic reactions (anaphylaxis).  Certain medicines, such as blood pressure medicine or medicines that make the body lose excess fluids (diuretics). Sometimes, this condition can be caused by not taking medicine as directed, such as taking too much of a certain medicine. What increases the risk? The following factors may make you more likely to develop this condition:  Age. Risk increases as you get older.  Conditions that affect the heart or the central nervous system.  Taking certain medicines, such as blood pressure medicine or diuretics.  Being pregnant. What are the signs or symptoms? Symptoms of this condition may include:  Weakness.  Light-headedness.  Dizziness.  Blurred vision.  Fatigue.  Rapid  heartbeat.  Fainting, in severe cases. How is this diagnosed? This condition is diagnosed based on:  Your medical history.  Your symptoms.  Your blood pressure measurement. Your health care provider will check your blood pressure when you are: ? Lying down. ? Sitting. ? Standing. A blood pressure reading is recorded as two numbers, such as "120 over 80" (or 120/80). The first ("top") number is called the systolic pressure. It is a measure of the pressure in your arteries as your heart beats. The second ("bottom") number is called the diastolic pressure. It is a measure of the pressure in your arteries when your heart relaxes between beats. Blood pressure is measured in a unit called mm Hg. Healthy blood pressure for most adults is 120/80. If your blood pressure is below 90/60, you may be diagnosed with hypotension. Other information or tests that may be used to diagnose orthostatic hypotension include:  Your other vital signs, such as your heart rate and temperature.  Blood tests.  Tilt table test. For this test, you will be safely secured to a table that moves you from a lying position to an upright position. Your heart rhythm and blood pressure will be monitored during the test. How is this treated? This condition may be treated by:  Changing your diet. This may involve eating more salt (sodium) or drinking more water.  Taking medicines to raise your blood pressure.  Changing the dosage of certain medicines you are taking that might be lowering your blood pressure.  Wearing compression stockings. These stockings help to prevent blood clots and reduce swelling in your legs. In some cases, you  may need to go to the hospital for:  Fluid replacement. This means you will receive fluids through an IV.  Blood replacement. This means you will receive donated blood through an IV (transfusion).  Treating an infection or heart problems, if this applies.  Monitoring. You may need to be  monitored while medicines that you are taking wear off. Follow these instructions at home: Eating and drinking   Drink enough fluid to keep your urine pale yellow.  Eat a healthy diet, and follow instructions from your health care provider about eating or drinking restrictions. A healthy diet includes: ? Fresh fruits and vegetables. ? Whole grains. ? Lean meats. ? Low-fat dairy products.  Eat extra salt only as directed. Do not add extra salt to your diet unless your health care provider told you to do that.  Eat frequent, small meals.  Avoid standing up suddenly after eating. Medicines  Take over-the-counter and prescription medicines only as told by your health care provider. ? Follow instructions from your health care provider about changing the dosage of your current medicines, if this applies. ? Do not stop or adjust any of your medicines on your own. General instructions   Wear compression stockings as told by your health care provider.  Get up slowly from lying down or sitting positions. This gives your blood pressure a chance to adjust.  Avoid hot showers and excessive heat as directed by your health care provider.  Return to your normal activities as told by your health care provider. Ask your health care provider what activities are safe for you.  Do not use any products that contain nicotine or tobacco, such as cigarettes, e-cigarettes, and chewing tobacco. If you need help quitting, ask your health care provider.  Keep all follow-up visits as told by your health care provider. This is important. Contact a health care provider if you:  Vomit.  Have diarrhea.  Have a fever for more than 2-3 days.  Feel more thirsty than usual.  Feel weak and tired. Get help right away if you:  Have chest pain.  Have a fast or irregular heartbeat.  Develop numbness in any part of your body.  Cannot move your arms or your legs.  Have trouble speaking.  Become sweaty  or feel light-headed.  Faint.  Feel short of breath.  Have trouble staying awake.  Feel confused. Summary  Orthostatic hypotension is a sudden drop in blood pressure that happens when you quickly change positions.  Orthostatic hypotension is usually not a serious problem.  It is diagnosed by having your blood pressure taken lying down, sitting, and then standing.  It may be treated by changing your diet or adjusting your medicines. This information is not intended to replace advice given to you by your health care provider. Make sure you discuss any questions you have with your health care provider. Document Released: 05/22/2002 Document Revised: 11/25/2017 Document Reviewed: 11/25/2017 Elsevier Interactive Patient Education  [employer].

## 2018-12-07 NOTE — Progress Notes (Signed)
Subjective:    Patient ID: Jeremiah Thomas, male    DOB: 1956-04-07, 63 y.o.   MRN: [id number]  HPI Pt is a 63 yo male with uncontrolled T2DM, HTN, OSA, CKD, hx of stroke who presents to the clinic for follow up.   DM- last a1c was 6.8. he reports to taking all his medication. Denies any hypoglycemia. No open sores or wounds. Not checking sugars.   HTN-no CP, palpitations, headaches, or vision changes. He reports he is using CPAP.   He does continue to feel dizzy when he stands or changes position.   .. Active Ambulatory Problems    Diagnosis Date Noted  . History of stroke 05/20/2016  . Asymptomatic hypertensive urgency 05/20/2016  . Acute kidney injury ([place]) 06/02/2016  . Essential hypertension, benign 06/02/2016  . CKD (chronic kidney disease) stage 3, GFR 30-59 ml/min (HCC) 06/03/2016  . Frequent urination at night 05/13/2017  . Elevated PSA 07/20/2017  . Elevated fasting glucose 07/20/2017  . Nasal congestion 08/15/2017  . Morbidly obese ([place]) 09/08/2017  . Type II diabetes mellitus, uncontrolled ([place]) 09/13/2017  . Prostate cancer ([place]) 10/22/2017  . Bilateral leg edema 02/21/2018  . Craving for particular food 02/21/2018  . Newly recognized heart murmur 02/21/2018  . 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  . Non-restorative sleep 06/13/2018  . Snoring 06/13/2018  . No energy 06/13/2018  . OSA (obstructive sleep apnea) 08/22/2018  . Paresthesias 09/12/2018  . Dizziness 09/12/2018  . Orthostatic hypotension 12/07/2018   Resolved Ambulatory Problems    Diagnosis Date Noted  . No Resolved Ambulatory Problems   Past Medical History:  Diagnosis Date  . Hypertension       Review of Systems See HPI.     Objective:   Physical Exam Vitals signs reviewed.  Constitutional:      Appearance: Normal appearance. He is obese.  HENT:     Head: Normocephalic.  Cardiovascular:     Rate and Rhythm: Normal rate.     Pulses: Normal pulses.  Pulmonary:     Effort: Pulmonary effort  is normal.     Breath sounds: Normal breath sounds.  Neurological:     General: No focal deficit present.     Mental Status: He is alert and oriented to person, place, and time.  Psychiatric:        Mood and Affect: Mood normal.           Assessment & Plan:  .Marland KitchenJohn was seen today for diabetes.  Diagnoses and all orders for this visit:  Uncontrolled type 2 diabetes mellitus with hyperglycemia ([place]) -     POCT glycosylated hemoglobin (Hb A1C) -     Ambulatory referral to Ophthalmology -     Dulaglutide (TRULICITY) 1.5 FB/5.1WC SOPN; Inject 1.5 mg into the skin once a week. -     sitaGLIPtin (JANUVIA) 100 MG tablet; Take 1 tablet (100 mg total) by mouth daily.  Essential hypertension, benign -     metoprolol tartrate (LOPRESSOR) 100 MG tablet; Take 1 tablet (100 mg total) by mouth 2 (two) times daily. (BETA BLOCKER) -     hydrALAZINE (APRESOLINE) 100 MG tablet; Take 1 tablet (100 mg total) by mouth 2 (two) times daily. -     amLODipine (NORVASC) 5 MG tablet; Take 1 tablet (5 mg total) by mouth daily.  CKD (chronic kidney disease) stage 3, GFR 30-59 ml/min (HCC)  Frequent urination at night -     finasteride (PROSCAR) 5 MG tablet; Take 1 tablet (5 mg  total) by mouth daily. -     tamsulosin (FLOMAX) 0.4 MG CAPS capsule; Take 2 capsules (0.8 mg total) by mouth daily.  Prostate cancer ([place]) -     finasteride (PROSCAR) 5 MG tablet; Take 1 tablet (5 mg total) by mouth daily. -     tamsulosin (FLOMAX) 0.4 MG CAPS capsule; Take 2 capsules (0.8 mg total) by mouth daily.  Bilateral leg edema -     furosemide (LASIX) 20 MG tablet; Take 1 tablet (20 mg total) by mouth 2 (two) times daily.  Orthostatic hypotension  Morbidly obese ([place])   .Marland Kitchen Results for orders placed or performed in visit on 12/07/18  POCT glycosylated hemoglobin (Hb A1C)  Result Value Ref Range   Hemoglobin A1C 7.0 (A) 4.0 - 5.6 %   HbA1c POC (<> result, manual entry)     HbA1c, POC (prediabetic range)      HbA1c, POC (controlled diabetic range)     A1C worsened a bit but rigth at 7.0 Continue same medications.  On lipitor.  On ACE.BP not controlled.  Discussed DM diet.  Need eye exam. Referral made.  Vaccines up to date.   Add lasix for as needed edema of legs.   Pt does have orthostatic hypotension. HO given. Watch and be careful with position changes. Stay hydrated.   Follow up in 3 months.

## 2018-12-12 ENCOUNTER — Encounter: Payer: Self-pay | Admitting: Physician Assistant

## 2018-12-21 LAB — HM DIABETES EYE EXAM

## 2019-01-05 ENCOUNTER — Encounter: Payer: Self-pay | Admitting: Physician Assistant

## 2019-01-12 ENCOUNTER — Other Ambulatory Visit: Payer: Self-pay | Admitting: Physician Assistant

## 2019-01-12 DIAGNOSIS — R600 Localized edema: Secondary | ICD-10-CM

## 2019-01-23 ENCOUNTER — Other Ambulatory Visit: Payer: Self-pay | Admitting: Physician Assistant

## 2019-01-23 DIAGNOSIS — E1165 Type 2 diabetes mellitus with hyperglycemia: Secondary | ICD-10-CM

## 2019-01-25 ENCOUNTER — Other Ambulatory Visit: Payer: Self-pay | Admitting: Physician Assistant

## 2019-01-25 DIAGNOSIS — I10 Essential (primary) hypertension: Secondary | ICD-10-CM

## 2019-02-08 ENCOUNTER — Other Ambulatory Visit: Payer: Self-pay | Admitting: Physician Assistant

## 2019-02-08 DIAGNOSIS — R600 Localized edema: Secondary | ICD-10-CM

## 2019-02-16 ENCOUNTER — Other Ambulatory Visit: Payer: Self-pay | Admitting: Physician Assistant

## 2019-02-16 DIAGNOSIS — Z8673 Personal history of transient ischemic attack (TIA), and cerebral infarction without residual deficits: Secondary | ICD-10-CM

## 2019-03-15 ENCOUNTER — Other Ambulatory Visit: Payer: Self-pay | Admitting: Physician Assistant

## 2019-03-15 ENCOUNTER — Other Ambulatory Visit: Payer: Self-pay

## 2019-03-15 ENCOUNTER — Ambulatory Visit (INDEPENDENT_AMBULATORY_CARE_PROVIDER_SITE_OTHER): Payer: BLUE CROSS/BLUE SHIELD | Admitting: Physician Assistant

## 2019-03-15 ENCOUNTER — Encounter: Payer: Self-pay | Admitting: Physician Assistant

## 2019-03-15 VITALS — BP 138/80 | HR 84 | Ht 74.4 in | Wt 323.0 lb

## 2019-03-15 DIAGNOSIS — I878 Other specified disorders of veins: Secondary | ICD-10-CM

## 2019-03-15 DIAGNOSIS — I10 Essential (primary) hypertension: Secondary | ICD-10-CM | POA: Diagnosis not present

## 2019-03-15 DIAGNOSIS — G4733 Obstructive sleep apnea (adult) (pediatric): Secondary | ICD-10-CM

## 2019-03-15 DIAGNOSIS — E11620 Type 2 diabetes mellitus with diabetic dermatitis: Secondary | ICD-10-CM

## 2019-03-15 LAB — POCT GLYCOSYLATED HEMOGLOBIN (HGB A1C): Hemoglobin A1C: 6.1 % — AB (ref 4.0–5.6)

## 2019-03-15 MED ORDER — TRULICITY 1.5 MG/0.5ML ~~LOC~~ SOAJ: 1.5000 mg | SUBCUTANEOUS | 2 refills | Status: DC

## 2019-03-15 NOTE — Patient Instructions (Addendum)
Try taking lipitor every other day  Start 1/2 tablet of norvasc(amolopidine)    Chronic Venous Insufficiency Chronic venous insufficiency is a condition where the leg veins cannot effectively pump blood from the legs to the heart. This happens when the vein walls are either stretched, weakened, or damaged, or when the valves inside the vein are damaged. With the right treatment, you should be able to continue with an active life. This condition is also called venous stasis. What are the causes? Common causes of this condition include:  High blood pressure inside the veins (venous hypertension).  Sitting or standing too long, causing increased blood pressure in the leg veins.  A blood clot that blocks blood flow in a vein (deep vein thrombosis, DVT).  Inflammation of a vein (phlebitis) that causes a blood clot to form.  Tumors in the pelvis that cause blood to back up. What increases the risk? The following factors may make you more likely to develop this condition:  Having a family history of this condition.  Obesity.  Pregnancy.  Living without enough regular physical activity or exercise (sedentary lifestyle).  Smoking.  Having a job that requires long periods of standing or sitting in one place.  Being a certain age. Women in their 34s and 15s and men in their 37s are more likely to develop this condition. What are the signs or symptoms? Symptoms of this condition include:  Veins that are enlarged, bulging, or twisted (varicose veins).  Skin breakdown or ulcers.  Reddened skin or dark discoloration of skin on the leg between the knee and ankle.  Brown, smooth, tight, and painful skin just above the ankle, usually on the inside of the leg (lipodermatosclerosis).  Swelling of the legs. How is this diagnosed? This condition may be diagnosed based on:  Your medical history.  A physical exam.  Tests, such as: ? A procedure that creates an image of a blood vessel  and nearby organs and provides information about blood flow through the blood vessel (duplex ultrasound). ? A procedure that tests blood flow (plethysmography). ? A procedure that looks at the veins using X-ray and dye (venogram). How is this treated? The goals of treatment are to help you return to an active life and to minimize pain or disability. Treatment depends on the severity of your condition, and it may include:  Wearing compression stockings. These can help relieve symptoms and help prevent your condition from getting worse. However, they do not cure the condition.  Sclerotherapy. This procedure involves an injection of a solution that shrinks damaged veins.  Surgery. This may involve: ? Removing a diseased vein (vein stripping). ? Cutting off blood flow through the vein (laser ablation surgery). ? Repairing or reconstructing a valve within the affected vein. Follow these instructions at home:      Wear compression stockings as told by your health care provider. These stockings help to prevent blood clots and reduce swelling in your legs.  Take over-the-counter and prescription medicines only as told by your health care provider.  Stay active by exercising, walking, or doing different activities. Ask your health care provider what activities are safe for you and how much exercise you need.  Drink enough fluid to keep your urine pale yellow.  Do not use any products that contain nicotine or tobacco, such as cigarettes, e-cigarettes, and chewing tobacco. If you need help quitting, ask your health care provider.  Keep all follow-up visits as told by your health care provider. This is  important. Contact a health care provider if you:  Have redness, swelling, or more pain in the affected area.  See a red streak or line that goes up or down from the affected area.  Have skin breakdown or skin loss in the affected area, even if the breakdown is small.  Get an injury in the  affected area. Get help right away if:  You get an injury and an open wound in the affected area.  You have: ? Severe pain that does not get better with medicine. ? Sudden numbness or weakness in the foot or ankle below the affected area. ? Trouble moving your foot or ankle. ? A fever. ? Worse or persistent symptoms. ? Chest pain. ? Shortness of breath. Summary  Chronic venous insufficiency is a condition where the leg veins cannot effectively pump blood from the legs to the heart.  Chronic venous insufficiency occurs when the vein walls become stretched, weakened, or damaged, or when valves within the vein are damaged.  Treatment depends on how severe your condition is. It often involves wearing compression stockings and may involve having a procedure.  Make sure you stay active by exercising, walking, or doing different activities. Ask your health care provider what activities are safe for you and how much exercise you need. This information is not intended to replace advice given to you by your health care provider. Make sure you discuss any questions you have with your health care provider. Document Released: 10/05/2006 Document Revised: 02/22/2018 Document Reviewed: 02/22/2018 Elsevier Patient Education  2020 [employer].

## 2019-03-15 NOTE — Progress Notes (Signed)
Subjective:    Patient ID: Jeremiah Thomas, male    DOB: Oct 30, 1955, 63 y.o.   MRN: [id number]  HPI  Pt is a 63 yo male with T2DM, HTN, OSA, chronic leg swelling who presents to the clinic for 3 month follow up.   Pt uses CPAP most nights.   Pts leg swelling comes and goes. Some days are better than others. He does not wear compression stockings very often. He does find when he walks more the swelling is better. No significant SOB.     Pt does not check his sugars. Pt is taking all of his medications. No CP, SOB, headache, vision changes. No hypoglycemic events. Pt is not watching his sugars and not exercising.  .. Active Ambulatory Problems    Diagnosis Date Noted  . History of stroke 05/20/2016  . Asymptomatic hypertensive urgency 05/20/2016  . Acute kidney injury ([place]) 06/02/2016  . Essential hypertension, benign 06/02/2016  . CKD (chronic kidney disease) stage 3, GFR 30-59 ml/min 06/03/2016  . Frequent urination at night 05/13/2017  . Elevated PSA 07/20/2017  . Elevated fasting glucose 07/20/2017  . Nasal congestion 08/15/2017  . Morbidly obese ([place]) 09/08/2017  . Type II diabetes mellitus, uncontrolled ([place]) 09/13/2017  . Prostate cancer ([place]) 10/22/2017  . Bilateral leg edema 02/21/2018  . Craving for particular food 02/21/2018  . Newly recognized heart murmur 02/21/2018  . 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  . Non-restorative sleep 06/13/2018  . Snoring 06/13/2018  . No energy 06/13/2018  . OSA (obstructive sleep apnea) 08/22/2018  . Paresthesias 09/12/2018  . Dizziness 09/12/2018  . Orthostatic hypotension 12/07/2018   Resolved Ambulatory Problems    Diagnosis Date Noted  . No Resolved Ambulatory Problems   Past Medical History:  Diagnosis Date  . Hypertension      Review of Systems  All other systems reviewed and are negative.      Objective:   Physical Exam Vitals signs reviewed.  Constitutional:      Appearance: Normal appearance. He is obese.  HENT:   Head: Normocephalic.  Cardiovascular:     Rate and Rhythm: Normal rate and regular rhythm.     Pulses: Normal pulses.  Pulmonary:     Effort: Pulmonary effort is normal.     Breath sounds: Normal breath sounds.  Musculoskeletal:     Right lower leg: Edema present.     Left lower leg: Edema present.  Neurological:     General: No focal deficit present.     Mental Status: He is alert and oriented to person, place, and time.  Psychiatric:        Mood and Affect: Mood normal.        Behavior: Behavior normal.          .. Lab Results  Component Value Date   HGBA1C 6.1 (A) 03/15/2019    Assessment & Plan:  .Marland KitchenJohn was seen today for diabetes.  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  Chronic venous stasis  Essential hypertension, benign  OSA (obstructive sleep apnea)   A1C looks great.  Continue on same medications.  On ARB. BP to goal.  On STATIN. LDL goal under 70.  Up to date eye exam.  Pt declines flu shot.   Marland KitchenMarland Kitchen  Diabetic Foot Exam - Simple   Simple Foot Form Diabetic Foot exam was performed with the following findings: Yes 03/15/2019  9:10 AM  Visual Inspection No deformities, no ulcerations, no other skin breakdown bilaterally: Yes See comments: Yes Sensation Testing Intact to touch and monofilament testing bilaterally: Yes See comments: Yes Pulse Check Posterior Tibialis and Dorsalis pulse intact bilaterally: Yes Comments No feeling in right heel to monofilament testing, some deformity in toenails bilaterally. Swelling in both calves with skin changes.       Last Echo 2017 with normal EF and no significant valve regurgitation.   Discussed wearing compression socks, keep feet elevated, watch salt intake,  continue taking lasix.   Follow up in 3 months.

## 2019-03-19 ENCOUNTER — Encounter: Payer: Self-pay | Admitting: Physician Assistant

## 2019-03-21 ENCOUNTER — Other Ambulatory Visit: Payer: Self-pay | Admitting: Physician Assistant

## 2019-03-21 DIAGNOSIS — C61 Malignant neoplasm of prostate: Secondary | ICD-10-CM

## 2019-03-21 DIAGNOSIS — E11620 Type 2 diabetes mellitus with diabetic dermatitis: Secondary | ICD-10-CM

## 2019-03-21 DIAGNOSIS — R351 Nocturia: Secondary | ICD-10-CM

## 2019-05-13 ENCOUNTER — Other Ambulatory Visit: Payer: Self-pay | Admitting: Physician Assistant

## 2019-05-13 DIAGNOSIS — E1165 Type 2 diabetes mellitus with hyperglycemia: Secondary | ICD-10-CM

## 2019-05-15 NOTE — Telephone Encounter (Signed)
Needs appointment

## 2019-06-14 ENCOUNTER — Encounter: Payer: Self-pay | Admitting: Physician Assistant

## 2019-06-14 ENCOUNTER — Ambulatory Visit: Payer: BLUE CROSS/BLUE SHIELD | Admitting: Physician Assistant

## 2019-06-14 ENCOUNTER — Ambulatory Visit (INDEPENDENT_AMBULATORY_CARE_PROVIDER_SITE_OTHER): Payer: BLUE CROSS/BLUE SHIELD | Admitting: Physician Assistant

## 2019-06-14 VITALS — BP 135/79 | HR 98 | Ht 74.0 in | Wt 329.0 lb

## 2019-06-14 DIAGNOSIS — E11620 Type 2 diabetes mellitus with diabetic dermatitis: Secondary | ICD-10-CM | POA: Diagnosis not present

## 2019-06-14 DIAGNOSIS — R079 Chest pain, unspecified: Secondary | ICD-10-CM | POA: Diagnosis not present

## 2019-06-14 DIAGNOSIS — G4733 Obstructive sleep apnea (adult) (pediatric): Secondary | ICD-10-CM

## 2019-06-14 DIAGNOSIS — I10 Essential (primary) hypertension: Secondary | ICD-10-CM | POA: Diagnosis not present

## 2019-06-14 DIAGNOSIS — N1831 Chronic kidney disease, stage 3a: Secondary | ICD-10-CM

## 2019-06-14 LAB — COMPLETE METABOLIC PANEL WITH GFR
AG Ratio: 1.4 (calc) (ref 1.0–2.5)
ALT: 29 U/L (ref 9–46)
AST: 22 U/L (ref 10–35)
Albumin: 4.1 g/dL (ref 3.6–5.1)
Alkaline phosphatase (APISO): 58 U/L (ref 35–144)
BUN/Creatinine Ratio: 8 (calc) (ref 6–22)
BUN: 18 mg/dL (ref 7–25)
CO2: 31 mmol/L (ref 20–32)
Calcium: 9 mg/dL (ref 8.6–10.3)
Chloride: 104 mmol/L (ref 98–110)
Creat: 2.28 mg/dL — ABNORMAL HIGH (ref 0.70–1.25)
GFR, Est African American: 34 mL/min/{1.73_m2} — ABNORMAL LOW (ref >=60)
GFR, Est Non African American: 29 mL/min/{1.73_m2} — ABNORMAL LOW (ref >=60)
Globulin: 3 g/dL (calc) (ref 1.9–3.7)
Glucose, Bld: 113 mg/dL — ABNORMAL HIGH (ref 65–99)
Potassium: 4.5 mmol/L (ref 3.5–5.3)
Sodium: 140 mmol/L (ref 135–146)
Total Bilirubin: 0.5 mg/dL (ref 0.2–1.2)
Total Protein: 7.1 g/dL (ref 6.1–8.1)

## 2019-06-14 LAB — POCT GLYCOSYLATED HEMOGLOBIN (HGB A1C): Hemoglobin A1C: 6.3 % — AB (ref 4.0–5.6)

## 2019-06-14 LAB — CK TOTAL AND CKMB (NOT AT ARMC)
CK, MB: 1.8 ng/mL (ref 0–5.0)
Relative Index: 0.4 (ref 0–4.0)
Total CK: 505 U/L — ABNORMAL HIGH (ref 44–196)

## 2019-06-14 LAB — TROPONIN I: Troponin I: 0.01 ng/mL (ref <=0.0)

## 2019-06-14 MED ORDER — HYDRALAZINE HCL 100 MG PO TABS: 100.0000 mg | ORAL_TABLET | Freq: Two times a day (BID) | ORAL | 1 refills | Status: DC

## 2019-06-14 MED ORDER — METHOCARBAMOL 500 MG PO TABS: 500.0000 mg | ORAL_TABLET | Freq: Three times a day (TID) | ORAL | 0 refills | Status: DC

## 2019-06-14 MED ORDER — TRULICITY 1.5 MG/0.5ML ~~LOC~~ SOAJ: 1.5000 mg | SUBCUTANEOUS | 2 refills | Status: DC

## 2019-06-14 NOTE — Progress Notes (Signed)
GREAT news. Troponin is not elevated. I will send over muscle relaxer to try. Ice left chest wall. Rest and drink plenty of water.

## 2019-06-14 NOTE — Progress Notes (Signed)
Subjective:    Patient ID: Jeremiah Thomas, male    DOB: [birth year], 63 y.o.   MRN: [id number]  HPI  Patient is a 63 year old male with hypertension, controlled type 2 diabetes, CKD who presents to the clinic for medication refill and new onset left-sided chest pain.  Patient is taking his medications as directed.  He denies any hypoglycemic events.  He is not checking his blood sugars.  He is not watching his diet and/or exercising.  He denies any open sores or wounds.  He started having chest pains last night.  They are all left sided and radiate up into the left side of his neck.  He denies any radiation down the left side of his arm.  Exertion does not make it worse.  Rest does not make better.  Laying down makes him feel the best.  He does admit to change in the breaks in his daughter's car the other day and using the dolly to pump up the car.  Denies any other injury.  Denies any lower extremity edema. Hx of CP sent to ED in 2017 full cardiac work up done and normal lexiscan with EF of 60-65 percent.     .. Active Ambulatory Problems    Diagnosis Date Noted  . History of stroke 05/20/2016  . Asymptomatic hypertensive urgency 05/20/2016  . Acute kidney injury ([place]) 06/02/2016  . Essential hypertension, benign 06/02/2016  . CKD (chronic kidney disease) stage 3, GFR 30-59 ml/min 06/03/2016  . Frequent urination at night 05/13/2017  . Elevated PSA 07/20/2017  . Elevated fasting glucose 07/20/2017  . Nasal congestion 08/15/2017  . Morbidly obese ([place]) 09/08/2017  . Type II diabetes mellitus, uncontrolled ([place]) 09/13/2017  . Prostate cancer ([place]) 10/22/2017  . Bilateral leg edema 02/21/2018  . Craving for particular food 02/21/2018  . Newly recognized heart murmur 02/21/2018  . 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  . Non-restorative sleep 06/13/2018  . Snoring 06/13/2018  . No energy 06/13/2018  . OSA (obstructive sleep apnea) 08/22/2018  . Paresthesias 09/12/2018  . Dizziness 09/12/2018   . Orthostatic hypotension 12/07/2018   Resolved Ambulatory Problems    Diagnosis Date Noted  . No Resolved Ambulatory Problems   Past Medical History:  Diagnosis Date  . Hypertension      Review of Systems See HPI.     Objective:   Physical Exam Vitals reviewed.  Constitutional:      Appearance: Normal appearance. He is obese.  HENT:     Head: Normocephalic.  Cardiovascular:     Rate and Rhythm: Normal rate and regular rhythm.     Pulses: Normal pulses.  Pulmonary:     Effort: Pulmonary effort is normal.     Breath sounds: Normal breath sounds.  Musculoskeletal:     Right lower leg: No edema.     Left lower leg: No edema.     Comments: Tenderness over left upper pectoralis muscles.   Neurological:     General: No focal deficit present.     Mental Status: He is alert and oriented to person, place, and time.  Psychiatric:        Mood and Affect: Mood normal.           Assessment & Plan:  .Marland KitchenJohn was seen today for diabetes.  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  Essential hypertension, benign -     hydrALAZINE (APRESOLINE) 100 MG tablet; Take 1 tablet (100 mg total) by mouth 2 (two) times daily.  OSA (obstructive sleep apnea)  Stage 3a chronic kidney disease  .Marland Kitchen Results for orders placed or performed in visit on 06/14/19  Troponin I  Result Value Ref Range   Troponin I 0.01 < OR = 0.0 ng/mL  CK Total (and CKMB)  Result Value Ref Range   Total CK 505 (H) 44 - 196 U/L   CK, MB 1.8 0 - 5.0 ng/mL   Relative Index 0.4 0 - 4.0  COMPLETE METABOLIC PANEL WITH GFR  Result Value Ref  Range   Glucose, Bld 113 (H) 65 - 99 mg/dL   BUN 18 7 - 25 mg/dL   Creat 2.28 (H) 0.70 - 1.25 mg/dL   GFR, Est Non African American 29 (L) > OR = 60 mL/min/1.28m2   GFR, Est African American 34 (L) > OR = 60 mL/min/1.33m2   BUN/Creatinine Ratio 8 6 - 22 (calc)   Sodium 140 135 - 146 mmol/L   Potassium 4.5 3.5 - 5.3 mmol/L   Chloride 104 98 - 110 mmol/L   CO2 31 20 - 32 mmol/L   Calcium 9.0 8.6 - 10.3 mg/dL   Total Protein 7.1 6.1 - 8.1 g/dL   Albumin 4.1 3.6 - 5.1 g/dL   Globulin 3.0 1.9 - 3.7 g/dL (calc)   AG Ratio 1.4 1.0 - 2.5 (calc)   Total Bilirubin 0.5 0.2 - 1.2 mg/dL   Alkaline phosphatase (APISO) 58 35 - 144 U/L   AST 22 10 - 35 U/L   ALT 29 9 - 46 U/L  POCT glycosylated hemoglobin (Hb A1C)  Result Value Ref Range   Hemoglobin A1C 6.3 (A) 4.0 - 5.6 %   HbA1c POC (<> result, manual entry)     HbA1c, POC (prediabetic range)     HbA1c, POC (controlled diabetic range)     A1C to goal, stable.  Stay on same dose of trulicity.  Stop januvia redundant to be on DPP4 and GLP1. On statin.  BP almost to goal. On ACE/ARB. Flu and pneumonia vaccine UTD.  Follow up in 3 months.   No comparisons available for EKG. Normal cardiac work up in 2017.There were some EKG changes with wide QRS and some elevation in V2, aVL and rhythm lead.  Will get STAT troponin and CKMB. CP sounds more musculoskeletal due to physical labor and changing his daughters breaks on her car.  Cardiac enzymes normal. Will treat CP with ice, rest, muscle relaxer, tylenol.no NSAIDs due to CKD> Follow up as needed. Discussed CP that should go to ED. Follow up as needed.

## 2019-06-15 ENCOUNTER — Other Ambulatory Visit: Payer: Self-pay

## 2019-06-15 ENCOUNTER — Other Ambulatory Visit: Payer: Self-pay | Admitting: Neurology

## 2019-06-15 DIAGNOSIS — R7989 Other specified abnormal findings of blood chemistry: Secondary | ICD-10-CM

## 2019-06-15 DIAGNOSIS — N189 Chronic kidney disease, unspecified: Secondary | ICD-10-CM

## 2019-06-15 NOTE — Progress Notes (Signed)
Call pt:   Kidney function is up a bit from baseline. CK is elevated indicating some muscle breakdown. Push water and rest upper body. Recheck CK and bmp next week. NO advil, ibuprofen, aleve, goodys powder this could hurt your kidneys.   Luvenia Starch

## 2019-06-15 NOTE — Progress Notes (Signed)
Elevated CK. Elevated serum creatninine. CKD.

## 2019-06-23 ENCOUNTER — Telehealth: Payer: Self-pay

## 2019-06-23 DIAGNOSIS — E669 Obesity, unspecified: Secondary | ICD-10-CM | POA: Insufficient documentation

## 2019-06-23 DIAGNOSIS — Z8546 Personal history of malignant neoplasm of prostate: Secondary | ICD-10-CM | POA: Insufficient documentation

## 2019-06-23 MED ORDER — DEXTROSE 10 % IV SOLN
125.00 | INTRAVENOUS | Status: DC
Start: ? — End: 2019-06-23

## 2019-06-23 MED ORDER — GLUCAGON (RDNA) 1 MG IJ KIT
1.00 | PACK | INTRAMUSCULAR | Status: DC
Start: ? — End: 2019-06-23

## 2019-06-23 MED ORDER — ACETAMINOPHEN 325 MG PO TABS
650.00 | ORAL_TABLET | ORAL | Status: DC
Start: ? — End: 2019-06-23

## 2019-06-23 MED ORDER — ASPIRIN 81 MG PO TBEC
81.00 | DELAYED_RELEASE_TABLET | ORAL | Status: DC
Start: 2019-06-24 — End: 2019-06-23

## 2019-06-23 MED ORDER — DEXAMETHASONE 4 MG PO TABS
6.00 | ORAL_TABLET | ORAL | Status: DC
Start: 2019-06-24 — End: 2019-06-23

## 2019-06-23 MED ORDER — HYDRALAZINE HCL 50 MG PO TABS
100.00 | ORAL_TABLET | ORAL | Status: DC
Start: 2019-06-23 — End: 2019-06-23

## 2019-06-23 MED ORDER — FAMOTIDINE 20 MG PO TABS
20.00 | ORAL_TABLET | ORAL | Status: DC
Start: 2019-06-24 — End: 2019-06-23

## 2019-06-23 MED ORDER — GLUCOSE 40 % PO GEL
15.00 | ORAL | Status: DC
Start: ? — End: 2019-06-23

## 2019-06-23 MED ORDER — ENOXAPARIN SODIUM 40 MG/0.4ML ~~LOC~~ SOLN
40.00 | SUBCUTANEOUS | Status: DC
Start: 2019-06-23 — End: 2019-06-23

## 2019-06-23 MED ORDER — SODIUM CHLORIDE 0.45 % IV SOLN
INTRAVENOUS | Status: DC
Start: ? — End: 2019-06-23

## 2019-06-23 MED ORDER — GENERIC EXTERNAL MEDICATION
100.00 | Status: DC
Start: 2019-06-24 — End: 2019-06-23

## 2019-06-23 MED ORDER — INSULIN LISPRO 100 UNIT/ML ~~LOC~~ SOLN
0.00 | SUBCUTANEOUS | Status: DC
Start: 2019-06-23 — End: 2019-06-23

## 2019-06-23 NOTE — Telephone Encounter (Signed)
Cleda Daub, Jona's sister, states he had loss of consciousness and was sent to the ED by EMS. She states she and his wife has not been able to find out any information.   Jeremiah Thomas [phone]

## 2019-06-23 NOTE — Telephone Encounter (Signed)
Thank you  for update

## 2019-06-27 NOTE — Telephone Encounter (Signed)
Can we call family member and check up on patient I see he does have COVID.

## 2019-06-27 NOTE — Telephone Encounter (Signed)
Spoke with patient's sister. She states patient still in hospital. He was transferred to [facility] on breathing treatments. Having issues with kidneys. She thanked me for the call. Notes in Care Everywhere.

## 2019-07-21 ENCOUNTER — Other Ambulatory Visit: Payer: Self-pay | Admitting: Physician Assistant

## 2019-07-21 DIAGNOSIS — E11620 Type 2 diabetes mellitus with diabetic dermatitis: Secondary | ICD-10-CM

## 2019-08-09 ENCOUNTER — Other Ambulatory Visit: Payer: Self-pay | Admitting: Physician Assistant

## 2019-08-09 DIAGNOSIS — Z8673 Personal history of transient ischemic attack (TIA), and cerebral infarction without residual deficits: Secondary | ICD-10-CM

## 2019-08-15 ENCOUNTER — Telehealth: Payer: Self-pay | Admitting: Neurology

## 2019-08-15 NOTE — Telephone Encounter (Signed)
Trey with [facility] called for clarification on start of care home health orders today. Asking for a call back at [phone].  Spoke with Lytle Michaels. He states patient was discharged from the hospital on 07/20/2019 to SNF and just went home Friday with home health orders. Orders are for PT/OT/Skilled nursing 2 times weekly for 3 weeks/labs as follows: CMP weekly x 3 weeks to check liver enzymes after prolonged use of diflucan and PT/INR every 2 weeks.   Please advise if orders are okay and if you are willing to sign.

## 2019-08-16 NOTE — Telephone Encounter (Signed)
Jeremiah Thomas made aware.

## 2019-08-16 NOTE — Telephone Encounter (Signed)
Yes ok with orders.

## 2019-08-17 ENCOUNTER — Telehealth: Payer: Self-pay | Admitting: Neurology

## 2019-08-17 LAB — CBC AND DIFFERENTIAL
HCT: 35 — AB (ref 41–53)
Hemoglobin: 11.2 — AB (ref 13.5–17.5)
Neutrophils Absolute: 4
Platelets: 310 (ref 150–399)
WBC: 6.8

## 2019-08-17 LAB — HEPATIC FUNCTION PANEL
ALT: 11 (ref 10–40)
AST: 9 — AB (ref 14–40)
Alkaline Phosphatase: 63 (ref 25–125)
Bilirubin, Total: 0.3

## 2019-08-17 LAB — BASIC METABOLIC PANEL
BUN: 14 (ref 4–21)
CO2: 25 — AB (ref 13–22)
Chloride: 102 (ref 99–108)
Creatinine: 2.4 — AB (ref 0.6–1.3)
Glucose: 175
Potassium: 4.3 (ref 3.4–5.3)
Sodium: 141 (ref 137–147)

## 2019-08-17 LAB — COMPREHENSIVE METABOLIC PANEL
Albumin: 3.4 — AB (ref 3.5–5.0)
Calcium: 9.1 (ref 8.7–10.7)
Globulin: 3

## 2019-08-17 LAB — CBC: RBC: 3.77 — AB (ref 3.87–5.11)

## 2019-08-17 MED ORDER — ALBUTEROL SULFATE HFA 108 (90 BASE) MCG/ACT IN AERS: 2.0000 | INHALATION_SPRAY | Freq: Four times a day (QID) | RESPIRATORY_TRACT | 0 refills | Status: DC | PRN

## 2019-08-17 MED ORDER — APIXABAN 5 MG PO TABS: 5.0000 mg | ORAL_TABLET | Freq: Two times a day (BID) | ORAL | 0 refills | Status: DC

## 2019-08-17 MED ORDER — PANTOPRAZOLE SODIUM 40 MG PO TBEC: 40.0000 mg | DELAYED_RELEASE_TABLET | Freq: Two times a day (BID) | ORAL | 0 refills | Status: DC

## 2019-08-17 NOTE — Telephone Encounter (Signed)
Cecille Rubin with [facility] called [phone]) stating patient was discharged and told to continue the following medications but does not have them and no RX at pharmacy:  Eliquis Protonix Albuterol Sertraline  None of the above prescriptions have been prescribed by our office. He uses [facility] in [place]. He has appt 08/23/2019.  Spoke with Jacolyn Reedy, NP who advised okay to send supply until appt.   Sent all medications except Sertraline. Can not find dosage in chart. Left message on machine for Cecille Rubin with [facility] to call me back to confirm.

## 2019-08-23 ENCOUNTER — Ambulatory Visit (INDEPENDENT_AMBULATORY_CARE_PROVIDER_SITE_OTHER): Payer: 59 | Admitting: Physician Assistant

## 2019-08-23 ENCOUNTER — Other Ambulatory Visit: Payer: Self-pay

## 2019-08-23 VITALS — BP 105/63 | HR 90

## 2019-08-23 DIAGNOSIS — E441 Mild protein-calorie malnutrition: Secondary | ICD-10-CM

## 2019-08-23 DIAGNOSIS — K922 Gastrointestinal hemorrhage, unspecified: Secondary | ICD-10-CM

## 2019-08-23 DIAGNOSIS — I2699 Other pulmonary embolism without acute cor pulmonale: Secondary | ICD-10-CM

## 2019-08-23 DIAGNOSIS — E8809 Other disorders of plasma-protein metabolism, not elsewhere classified: Secondary | ICD-10-CM

## 2019-08-23 DIAGNOSIS — D649 Anemia, unspecified: Secondary | ICD-10-CM

## 2019-08-23 DIAGNOSIS — U071 COVID-19: Secondary | ICD-10-CM | POA: Diagnosis not present

## 2019-08-23 DIAGNOSIS — N184 Chronic kidney disease, stage 4 (severe): Secondary | ICD-10-CM

## 2019-08-23 DIAGNOSIS — R748 Abnormal levels of other serum enzymes: Secondary | ICD-10-CM

## 2019-08-23 DIAGNOSIS — J1282 Pneumonia due to coronavirus disease 2019: Secondary | ICD-10-CM

## 2019-08-23 DIAGNOSIS — R7989 Other specified abnormal findings of blood chemistry: Secondary | ICD-10-CM | POA: Diagnosis not present

## 2019-08-23 DIAGNOSIS — J9611 Chronic respiratory failure with hypoxia: Secondary | ICD-10-CM | POA: Diagnosis not present

## 2019-08-23 MED ORDER — APIXABAN 5 MG PO TABS: 5.0000 mg | ORAL_TABLET | Freq: Two times a day (BID) | ORAL | 0 refills | Status: DC

## 2019-08-23 NOTE — Progress Notes (Signed)
Subjective:    Patient ID: Jeremiah Thomas, male    DOB: March 18, 1956, 64 y.o.   MRN: [id number]  HPI  Pt is a 64 yo male with extensive health history since last visit. On 06/23/2019 he was admitted for acute respiratory failure due to COVID and discharged the next day. He went back to ED on 06/24/2019 with pulmonary embolism, organ failure and sepsis. He was discharged then on 07/14/2019 and then returned on 07/15/2019 for pneumonia, lower GI bleed he was discharged the last time 07/21/2019. He was sent to rehab facility and just discharge home 2 weeks ago. He has PT/OT coming in twice a week. He is not on eliquis.  He is feeling some better. He is really weak and tired. He is very SOB. He continues to use 3L of O2 all the time. No fever, chills, body aches.   Pt is not on anticogulant because of lower GI bleed.   Per patient was supposed to see nephrologist about CKD.   Labs drawn 3/4 show low albumin, anemia, CKD 4.   .. Active Ambulatory Problems    Diagnosis Date Noted  . History of stroke 05/20/2016  . Asymptomatic hypertensive urgency 05/20/2016  . Acute kidney injury ([place]) 06/02/2016  . Essential hypertension, benign 06/02/2016  . CKD (chronic kidney disease) stage 3, GFR 30-59 ml/min 06/03/2016  . Frequent urination at night 05/13/2017  . Elevated PSA 07/20/2017  . Elevated fasting glucose 07/20/2017  . Nasal congestion 08/15/2017  . Morbidly obese ([place]) 09/08/2017  . Type II diabetes mellitus, uncontrolled ([place]) 09/13/2017  . Prostate cancer ([place]) 10/22/2017  . Bilateral leg edema 02/21/2018  . Craving for particular food 02/21/2018  . Newly recognized heart murmur 02/21/2018  . 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  . Non-restorative sleep 06/13/2018  . Snoring 06/13/2018  . No energy 06/13/2018  . OSA (obstructive sleep apnea) 08/22/2018  . Paresthesias 09/12/2018  . Dizziness 09/12/2018  . Orthostatic hypotension 12/07/2018  . Pneumonia due to COVID-19 virus 08/25/2019  . Chronic  respiratory failure with hypoxia ([place]) 08/25/2019  . Elevated lipase 08/25/2019  . Low TSH level 08/25/2019  . Bilateral pulmonary embolism ([place]) 08/25/2019  . Lower GI bleed 08/25/2019  . Malnutrition of mild degree ([place]) 08/25/2019  . Serum albumin decreased 08/25/2019  . Anemia 08/25/2019   Resolved Ambulatory Problems    Diagnosis Date Noted  . No Resolved Ambulatory Problems   Past Medical History:  Diagnosis Date  . Hypertension       Review of Systems See HPI.     Objective:   Physical Exam Vitals reviewed.  Constitutional:      Appearance: He is ill-appearing.  HENT:     Head: Normocephalic.  Cardiovascular:     Rate and Rhythm: Normal rate and regular rhythm.  Pulmonary:     Effort: Pulmonary effort is normal.     Breath sounds: Normal breath sounds.  Musculoskeletal:     Right lower leg: Edema present.     Left lower leg: Edema present.  Neurological:     Mental Status: He is alert and oriented to person, place, and time.  Psychiatric:        Mood and Affect: Mood normal.           Assessment & Plan:  .Marland KitchenJohn was seen today for hospitalization follow-up.  Diagnoses and all orders for this visit:  Pneumonia due to COVID-19 virus -     COMPLETE METABOLIC PANEL WITH GFR -     CBC  w/Diff/Platelet -     DG Chest 2 View  CKD (chronic kidney disease) stage 4, GFR 15-29 ml/min (HCC) -     COMPLETE METABOLIC PANEL WITH GFR  Chronic respiratory failure with hypoxia (HCC) -     COMPLETE METABOLIC PANEL WITH GFR  Low TSH level -     TSH  Elevated lipase -     Lipase  Bilateral pulmonary embolism (HCC) -     COMPLETE METABOLIC PANEL WITH GFR -     CBC w/Diff/Platelet -     Lipase -     TSH -     DG Chest 2 View -     Antiphospholipid syndrome eval, bld -     Lupus anticoagulant panel -     Protein C activity -     Protein S activity -     Homocysteine -     Factor 5 leiden -     Protime-INR -     APTT -     Fibrinogen  Lower GI  bleed  Malnutrition of mild degree (HCC)  Serum albumin decreased  Anemia, unspecified type  Other orders -     Discontinue: apixaban (ELIQUIS) 5 MG TABS tablet; Take 1 tablet (5 mg total) by mouth 2 (two) times daily.   Pt had quite a few abnormal labs and sequela from covid.  Will recheck CXR and labs.  Will add hypercoagulability testing.  Due to GI bleed and 6 weeks post covid stop anticoagulation.  Stay on protonix.   CKD 4- needs nephrology per patient he has appt. Keep this appt.   Recheck in 3 weeks.  Continue with PT and O2. Discussed lung rehab exercises.   Spent 40 minutes with patient.

## 2019-08-24 LAB — COMPLETE METABOLIC PANEL WITH GFR
AG Ratio: 1.2 (calc) (ref 1.0–2.5)
ALT: 12 U/L (ref 9–46)
AST: 11 U/L (ref 10–35)
Albumin: 3.8 g/dL (ref 3.6–5.1)
Alkaline phosphatase (APISO): 61 U/L (ref 35–144)
BUN/Creatinine Ratio: 7 (calc) (ref 6–22)
BUN: 17 mg/dL (ref 7–25)
CO2: 31 mmol/L (ref 20–32)
Calcium: 9.4 mg/dL (ref 8.6–10.3)
Chloride: 103 mmol/L (ref 98–110)
Creat: 2.56 mg/dL — ABNORMAL HIGH (ref 0.70–1.25)
GFR, Est African American: 29 mL/min/{1.73_m2} — ABNORMAL LOW (ref >=60)
GFR, Est Non African American: 25 mL/min/{1.73_m2} — ABNORMAL LOW (ref >=60)
Globulin: 3.2 g/dL (calc) (ref 1.9–3.7)
Glucose, Bld: 124 mg/dL — ABNORMAL HIGH (ref 65–99)
Potassium: 4.6 mmol/L (ref 3.5–5.3)
Sodium: 142 mmol/L (ref 135–146)
Total Bilirubin: 0.3 mg/dL (ref 0.2–1.2)
Total Protein: 7 g/dL (ref 6.1–8.1)

## 2019-08-24 LAB — CBC WITH DIFFERENTIAL/PLATELET
Absolute Monocytes: 1069 cells/uL — ABNORMAL HIGH (ref 200–950)
Basophils Absolute: 168 cells/uL (ref 0–200)
Basophils Relative: 1.7 %
Eosinophils Absolute: 139 cells/uL (ref 15–500)
Eosinophils Relative: 1.4 %
HCT: 36.5 % — ABNORMAL LOW (ref 38.5–50.0)
Hemoglobin: 11.5 g/dL — ABNORMAL LOW (ref 13.2–17.1)
Lymphs Abs: 2228 cells/uL (ref 850–3900)
MCH: 29 pg (ref 27.0–33.0)
MCHC: 31.5 g/dL — ABNORMAL LOW (ref 32.0–36.0)
MCV: 92.2 fL (ref 80.0–100.0)
MPV: 11.1 fL (ref 7.5–12.5)
Monocytes Relative: 10.8 %
Neutro Abs: 6296 cells/uL (ref 1500–7800)
Neutrophils Relative %: 63.6 %
Platelets: 303 10*3/uL (ref 140–400)
RBC: 3.96 10*6/uL — ABNORMAL LOW (ref 4.20–5.80)
RDW: 13.9 % (ref 11.0–15.0)
Total Lymphocyte: 22.5 %
WBC: 9.9 10*3/uL (ref 3.8–10.8)

## 2019-08-24 LAB — TSH: TSH: 1.37 mIU/L (ref 0.40–4.50)

## 2019-08-24 LAB — LIPASE: Lipase: 140 U/L — ABNORMAL HIGH (ref 7–60)

## 2019-08-25 ENCOUNTER — Telehealth: Payer: Self-pay | Admitting: Physician Assistant

## 2019-08-25 ENCOUNTER — Encounter: Payer: Self-pay | Admitting: Physician Assistant

## 2019-08-25 DIAGNOSIS — D649 Anemia, unspecified: Secondary | ICD-10-CM | POA: Insufficient documentation

## 2019-08-25 DIAGNOSIS — J1282 Pneumonia due to coronavirus disease 2019: Secondary | ICD-10-CM

## 2019-08-25 DIAGNOSIS — R748 Abnormal levels of other serum enzymes: Secondary | ICD-10-CM

## 2019-08-25 DIAGNOSIS — E8809 Other disorders of plasma-protein metabolism, not elsewhere classified: Secondary | ICD-10-CM | POA: Insufficient documentation

## 2019-08-25 DIAGNOSIS — J9611 Chronic respiratory failure with hypoxia: Secondary | ICD-10-CM | POA: Insufficient documentation

## 2019-08-25 DIAGNOSIS — I2699 Other pulmonary embolism without acute cor pulmonale: Secondary | ICD-10-CM | POA: Insufficient documentation

## 2019-08-25 DIAGNOSIS — E441 Mild protein-calorie malnutrition: Secondary | ICD-10-CM | POA: Insufficient documentation

## 2019-08-25 DIAGNOSIS — K922 Gastrointestinal hemorrhage, unspecified: Secondary | ICD-10-CM | POA: Insufficient documentation

## 2019-08-25 DIAGNOSIS — R7989 Other specified abnormal findings of blood chemistry: Secondary | ICD-10-CM | POA: Insufficient documentation

## 2019-08-25 DIAGNOSIS — U071 COVID-19: Secondary | ICD-10-CM

## 2019-08-25 HISTORY — DX: Other specified abnormal findings of blood chemistry: R79.89

## 2019-08-25 HISTORY — DX: Gastrointestinal hemorrhage, unspecified: K92.2

## 2019-08-25 HISTORY — DX: Other disorders of plasma-protein metabolism, not elsewhere classified: E88.09

## 2019-08-25 HISTORY — DX: Mild protein-calorie malnutrition: E44.1

## 2019-08-25 HISTORY — DX: Pneumonia due to coronavirus disease 2019: J12.82

## 2019-08-25 HISTORY — DX: Anemia, unspecified: D64.9

## 2019-08-25 HISTORY — DX: Other pulmonary embolism without acute cor pulmonale: I26.99

## 2019-08-25 HISTORY — DX: COVID-19: U07.1

## 2019-08-25 HISTORY — DX: Chronic respiratory failure with hypoxia: J96.11

## 2019-08-25 HISTORY — DX: Abnormal levels of other serum enzymes: R74.8

## 2019-08-25 NOTE — Telephone Encounter (Signed)
Please call patient:  After review of chart and new guidelines that 6 weeks after covid pulmonary embolism you can stop anticoagulation. STOP Eliquis this would also help to eliminate chance of GI bleed in future.   I did not see labs drawn or CXR. Can you find out why?

## 2019-08-25 NOTE — Telephone Encounter (Signed)
Spoke with patient. He was advised not to restart Eliquis.   He didn't have labs or xray done. Didn't really give a reason, he states he would and I strongly encouraged him to. He states he will try to go on Monday.   [place].

## 2019-08-25 NOTE — Progress Notes (Signed)
I see labs now.  Kidney function has worsened. He said he had appt with nephrologist make sure you keep this.  Thyroid normal.  Lipase still elevated. Any upper abdominal pain? Nausea?  Hemoglobin seems to be improving.

## 2019-08-28 DIAGNOSIS — I2699 Other pulmonary embolism without acute cor pulmonale: Secondary | ICD-10-CM

## 2019-09-12 ENCOUNTER — Ambulatory Visit: Payer: BLUE CROSS/BLUE SHIELD | Admitting: Physician Assistant

## 2019-09-12 LAB — BASIC METABOLIC PANEL
BUN: 19 (ref 4–21)
CO2: 28 — AB (ref 13–22)
Chloride: 108 (ref 99–108)
Creatinine: 2.3 — AB (ref 0.6–1.3)
Glucose: 138
Potassium: 4.5 (ref 3.4–5.3)
Sodium: 147 (ref 137–147)

## 2019-09-12 LAB — KAPPA/LAMBDA LIGHT CHAINS, FREE, WITH RATIO, 24HR. URINE
Free Kappa Lt Chains,Ur: 74.1
Free Lambda Lt Chains,Ur: 49.2
Kappa/Lambda FluidC Ratio: 1.51

## 2019-09-12 LAB — PROTEIN / CREATININE RATIO, URINE
Creatinine, Ur: 216.4 mg/dL
Protein, Ur: 62.2
Protein/Creatinine Ratio: 0.29

## 2019-09-12 LAB — COMPREHENSIVE METABOLIC PANEL: Calcium: 9 (ref 8.7–10.7)

## 2019-09-18 ENCOUNTER — Other Ambulatory Visit: Payer: Self-pay

## 2019-09-18 ENCOUNTER — Ambulatory Visit (INDEPENDENT_AMBULATORY_CARE_PROVIDER_SITE_OTHER): Payer: 59 | Admitting: Physician Assistant

## 2019-09-18 VITALS — BP 132/84 | HR 84 | Ht 74.0 in

## 2019-09-18 DIAGNOSIS — E11620 Type 2 diabetes mellitus with diabetic dermatitis: Secondary | ICD-10-CM

## 2019-09-18 DIAGNOSIS — G4733 Obstructive sleep apnea (adult) (pediatric): Secondary | ICD-10-CM | POA: Diagnosis not present

## 2019-09-18 DIAGNOSIS — R4589 Other symptoms and signs involving emotional state: Secondary | ICD-10-CM | POA: Diagnosis not present

## 2019-09-18 DIAGNOSIS — J9611 Chronic respiratory failure with hypoxia: Secondary | ICD-10-CM

## 2019-09-18 DIAGNOSIS — R42 Dizziness and giddiness: Secondary | ICD-10-CM

## 2019-09-18 DIAGNOSIS — Z8616 Personal history of COVID-19: Secondary | ICD-10-CM

## 2019-09-18 LAB — POCT GLYCOSYLATED HEMOGLOBIN (HGB A1C): Hemoglobin A1C: 6.1 % — AB (ref 4.0–5.6)

## 2019-09-18 NOTE — Progress Notes (Signed)
Subjective:    Patient ID: Jeremiah Thomas, male    DOB: March 31, 1956, 65 y.o.   MRN: [id number]  HPI  Pt is a 64 yo male with recent hx of COVID and complications of PE and lower GI bleed. He is off anti-coagulation. He continues to have PT come to his home twice a week. He is feeling stronger. Still on O2 3L. Scared to cut it back. Saw nephrology with GFR 29. Follow up in May.   He is not checking sugars. He is taking trulicity. No open sores or wounds.   He does get some weakness feelings and lightheadedness at times. No fall.   He admits to feeling depressed and sad. He does not like "just sitting around". He wants to go fishing. Denies any SI/HC.   Marland Kitchen. Active Ambulatory Problems    Diagnosis Date Noted  . History of stroke 05/20/2016  . Asymptomatic hypertensive urgency 05/20/2016  . Acute kidney injury ([place]) 06/02/2016  . Essential hypertension, benign 06/02/2016  . CKD (chronic kidney disease) stage 3, GFR 30-59 ml/min 06/03/2016  . Frequent urination at night 05/13/2017  . Elevated PSA 07/20/2017  . Elevated fasting glucose 07/20/2017  . Nasal congestion 08/15/2017  . Morbidly obese ([place]) 09/08/2017  . Type II diabetes mellitus, uncontrolled ([place]) 09/13/2017  . Prostate cancer ([place]) 10/22/2017  . Bilateral leg edema 02/21/2018  . Craving for particular food 02/21/2018  . Newly recognized heart murmur 02/21/2018  . 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  . Non-restorative sleep 06/13/2018  . Snoring 06/13/2018  . No energy 06/13/2018  . OSA (obstructive sleep apnea) 08/22/2018  . Paresthesias 09/12/2018  . Dizziness 09/12/2018  . Orthostatic hypotension 12/07/2018  . Pneumonia due to COVID-19 virus 08/25/2019  . Chronic respiratory failure with hypoxia ([place]) 08/25/2019  . Elevated lipase 08/25/2019  . Low TSH level 08/25/2019  . Bilateral pulmonary embolism ([place]) 08/25/2019  . Lower GI bleed 08/25/2019  . Malnutrition of mild degree ([place]) 08/25/2019  . Serum albumin decreased  08/25/2019  . Anemia 08/25/2019  . History of COVID-19 09/20/2019  . 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 No Resolved Ambulatory Problems   Past Medical History:  Diagnosis Date  . Hypertension         Review of Systems See HPI.     Objective:   Physical Exam Vitals reviewed.  Constitutional:      Appearance: Normal appearance. He is obese.     Comments: In wheelchair  Neurological:     Mental Status: He is alert.    .. Depression screen [facility] 2/9 09/18/2019 06/06/2018 03/08/2018 05/11/2017  Decreased Interest 2 3 1 1   Down, Depressed, Hopeless 1 1 1 1   PHQ - 2 Score 3 4 2 2   Altered sleeping 2 2 3  0  Tired, decreased energy 3 3 3 1   Change in appetite 0 2 2 1   Feeling bad or failure about yourself  1 1 1 1   Trouble concentrating 0 3 3 0  Moving slowly or fidgety/restless 1 1 1 1   Suicidal thoughts 0 0 0 0  PHQ-9 Score 10 16 15 6   Difficult doing work/chores Not difficult at all Very difficult Somewhat difficult -   .. GAD 7 : Generalized Anxiety Score 09/18/2019 06/06/2018 03/08/2018  Nervous, Anxious, on Edge 1 1 2   Control/stop worrying 1 1 2   Worry too much - different  things 1 1 3   Trouble relaxing 1 2 3   Restless 0 1 2  Easily annoyed or irritable 1 2 3   Afraid - awful might happen 1 2 0  Total GAD 7 Score 6 10 15   Anxiety Difficulty Somewhat difficult Somewhat difficult Very difficult           Assessment & Plan:  .Marland KitchenJohn was seen today for follow-up.  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  Continue on same medications.  On statin.  On ARB. Eye exam UTD.  Flu shot up to date.  Needs pneumonia shot. Pt would like to wait until next follow up.   Pt seems to be doing some better after COVID infection and pulmonary embolism. He is seeing nephrology due to kidney function.  Needs to come down on O2. On 3L at 100 percent O2.  Will cut back to 2.5 then 2.0 then 1.5 follow up as needed.  Check BP when feeling lightheaded. Make sure normal.  Stay hydrated.   Keep nephrology appt. GFR 29.   Pt never got hypercoagulability work up. Likely PE due to COVID complication. Still encouraged patient to get work up.    Discussed mood. Declines medication. He thinks just going fishing will help. Discussed how that can help but at times need more.  Follow up as needed.   Follow up in 3 months.

## 2019-09-18 NOTE — Patient Instructions (Signed)
Decrease O2 down to 2.5 then 2L then working towards as needed.  Keep follow up with kidney doctor.

## 2019-09-20 ENCOUNTER — Encounter: Payer: Self-pay | Admitting: Physician Assistant

## 2019-09-20 DIAGNOSIS — E11620 Type 2 diabetes mellitus with diabetic dermatitis: Secondary | ICD-10-CM

## 2019-09-20 DIAGNOSIS — R4589 Other symptoms and signs involving emotional state: Secondary | ICD-10-CM | POA: Insufficient documentation

## 2019-09-20 DIAGNOSIS — Z8616 Personal history of COVID-19: Secondary | ICD-10-CM | POA: Insufficient documentation

## 2019-09-20 HISTORY — DX: Other symptoms and signs involving emotional state: R45.89

## 2019-09-20 HISTORY — DX: Type 2 diabetes mellitus with diabetic dermatitis: E11.620

## 2019-09-20 HISTORY — DX: Personal history of COVID-19: Z86.16

## 2019-09-22 NOTE — Progress Notes (Signed)
Note and labs sent to Dr. Wynetta Emery at [facility] nephrology at [phone] with confirmation received.

## 2019-10-02 ENCOUNTER — Other Ambulatory Visit: Payer: Self-pay | Admitting: Neurology

## 2019-10-02 MED ORDER — ALBUTEROL SULFATE HFA 108 (90 BASE) MCG/ACT IN AERS: 2.0000 | INHALATION_SPRAY | Freq: Four times a day (QID) | RESPIRATORY_TRACT | 0 refills | Status: DC | PRN

## 2019-10-10 ENCOUNTER — Telehealth: Payer: Self-pay | Admitting: Neurology

## 2019-10-10 NOTE — Telephone Encounter (Signed)
Cecelia with [facility] [phone]) called to get a verbal order to continue physical therapy 2 times a week for 5 weeks, then 1 times a week for 4 weeks to work on strengthening and balance. Called Cecelia and gave verbal order for continuation of physical therapy. [place].

## 2019-10-17 ENCOUNTER — Other Ambulatory Visit: Payer: Self-pay | Admitting: Neurology

## 2019-10-26 ENCOUNTER — Other Ambulatory Visit: Payer: Self-pay

## 2019-10-26 MED ORDER — ALBUTEROL SULFATE HFA 108 (90 BASE) MCG/ACT IN AERS: 2.0000 | INHALATION_SPRAY | Freq: Four times a day (QID) | RESPIRATORY_TRACT | 0 refills | Status: DC | PRN

## 2019-10-30 ENCOUNTER — Other Ambulatory Visit: Payer: Self-pay | Admitting: Neurology

## 2019-10-30 DIAGNOSIS — I10 Essential (primary) hypertension: Secondary | ICD-10-CM

## 2019-10-31 ENCOUNTER — Other Ambulatory Visit: Payer: Self-pay | Admitting: Physician Assistant

## 2019-10-31 DIAGNOSIS — R600 Localized edema: Secondary | ICD-10-CM

## 2019-10-31 NOTE — Telephone Encounter (Signed)
Please call patient and find out from him if he is taking like this. Also if he has seen nephrology? His kidney function was not good. He should be taking this more as needed or no more than once a day for lower extermity swelling.

## 2019-10-31 NOTE — Telephone Encounter (Signed)
I can't tell if patient should still be taking, please review?

## 2019-10-31 NOTE — Telephone Encounter (Signed)
Left message on machine for patient to call back.

## 2019-11-07 ENCOUNTER — Other Ambulatory Visit: Payer: Self-pay | Admitting: Physician Assistant

## 2019-11-07 DIAGNOSIS — Z8673 Personal history of transient ischemic attack (TIA), and cerebral infarction without residual deficits: Secondary | ICD-10-CM

## 2019-11-07 DIAGNOSIS — H81399 Other peripheral vertigo, unspecified ear: Secondary | ICD-10-CM

## 2019-11-07 DIAGNOSIS — R29818 Other symptoms and signs involving the nervous system: Secondary | ICD-10-CM

## 2019-11-15 ENCOUNTER — Other Ambulatory Visit: Payer: Self-pay | Admitting: Neurology

## 2019-11-15 DIAGNOSIS — Z8673 Personal history of transient ischemic attack (TIA), and cerebral infarction without residual deficits: Secondary | ICD-10-CM

## 2019-11-15 DIAGNOSIS — R600 Localized edema: Secondary | ICD-10-CM

## 2019-11-15 MED ORDER — ATORVASTATIN CALCIUM 40 MG PO TABS: 40.0000 mg | ORAL_TABLET | Freq: Every day | ORAL | 0 refills | Status: DC

## 2019-11-23 ENCOUNTER — Other Ambulatory Visit: Payer: Self-pay

## 2019-11-23 MED ORDER — ALBUTEROL SULFATE HFA 108 (90 BASE) MCG/ACT IN AERS: 2.0000 | INHALATION_SPRAY | Freq: Four times a day (QID) | RESPIRATORY_TRACT | 0 refills | Status: DC | PRN

## 2019-12-05 ENCOUNTER — Other Ambulatory Visit: Payer: Self-pay | Admitting: Neurology

## 2019-12-05 DIAGNOSIS — R600 Localized edema: Secondary | ICD-10-CM

## 2019-12-13 ENCOUNTER — Other Ambulatory Visit: Payer: Self-pay | Admitting: Physician Assistant

## 2019-12-13 DIAGNOSIS — C61 Malignant neoplasm of prostate: Secondary | ICD-10-CM

## 2019-12-13 DIAGNOSIS — R351 Nocturia: Secondary | ICD-10-CM

## 2019-12-13 NOTE — Telephone Encounter (Signed)
Sent a request to Nephrology in [place] to get recent notes. Should patient be on medications?

## 2019-12-15 NOTE — Telephone Encounter (Signed)
Nephrology is managing more kidneys. He is not seeing a urologist right now I don't think. I refilled.

## 2019-12-19 ENCOUNTER — Ambulatory Visit (INDEPENDENT_AMBULATORY_CARE_PROVIDER_SITE_OTHER): Payer: 59 | Admitting: Physician Assistant

## 2019-12-19 ENCOUNTER — Encounter: Payer: Self-pay | Admitting: Physician Assistant

## 2019-12-19 ENCOUNTER — Other Ambulatory Visit: Payer: Self-pay | Admitting: Physician Assistant

## 2019-12-19 VITALS — BP 149/87 | HR 85 | Ht 74.0 in | Wt 304.0 lb

## 2019-12-19 DIAGNOSIS — R011 Cardiac murmur, unspecified: Secondary | ICD-10-CM

## 2019-12-19 DIAGNOSIS — R4589 Other symptoms and signs involving emotional state: Secondary | ICD-10-CM

## 2019-12-19 DIAGNOSIS — I10 Essential (primary) hypertension: Secondary | ICD-10-CM

## 2019-12-19 DIAGNOSIS — E11620 Type 2 diabetes mellitus with diabetic dermatitis: Secondary | ICD-10-CM | POA: Diagnosis not present

## 2019-12-19 DIAGNOSIS — R600 Localized edema: Secondary | ICD-10-CM

## 2019-12-19 DIAGNOSIS — I2699 Other pulmonary embolism without acute cor pulmonale: Secondary | ICD-10-CM

## 2019-12-19 DIAGNOSIS — Z87891 Personal history of nicotine dependence: Secondary | ICD-10-CM

## 2019-12-19 DIAGNOSIS — G4733 Obstructive sleep apnea (adult) (pediatric): Secondary | ICD-10-CM

## 2019-12-19 DIAGNOSIS — N1832 Chronic kidney disease, stage 3b: Secondary | ICD-10-CM

## 2019-12-19 DIAGNOSIS — Z8673 Personal history of transient ischemic attack (TIA), and cerebral infarction without residual deficits: Secondary | ICD-10-CM | POA: Diagnosis not present

## 2019-12-19 DIAGNOSIS — E1169 Type 2 diabetes mellitus with other specified complication: Secondary | ICD-10-CM

## 2019-12-19 DIAGNOSIS — Z8616 Personal history of COVID-19: Secondary | ICD-10-CM

## 2019-12-19 DIAGNOSIS — E1165 Type 2 diabetes mellitus with hyperglycemia: Secondary | ICD-10-CM

## 2019-12-19 DIAGNOSIS — R200 Anesthesia of skin: Secondary | ICD-10-CM

## 2019-12-19 DIAGNOSIS — E785 Hyperlipidemia, unspecified: Secondary | ICD-10-CM

## 2019-12-19 DIAGNOSIS — R202 Paresthesia of skin: Secondary | ICD-10-CM

## 2019-12-19 DIAGNOSIS — N400 Enlarged prostate without lower urinary tract symptoms: Secondary | ICD-10-CM

## 2019-12-19 DIAGNOSIS — J9611 Chronic respiratory failure with hypoxia: Secondary | ICD-10-CM

## 2019-12-19 DIAGNOSIS — K219 Gastro-esophageal reflux disease without esophagitis: Secondary | ICD-10-CM

## 2019-12-19 HISTORY — DX: Personal history of nicotine dependence: Z87.891

## 2019-12-19 HISTORY — DX: Benign prostatic hyperplasia without lower urinary tract symptoms: N40.0

## 2019-12-19 LAB — B12 AND FOLATE PANEL
Folate: 11.4 ng/mL
Vitamin B-12: 557 pg/mL (ref 200–1100)

## 2019-12-19 LAB — LIPID PANEL W/REFLEX DIRECT LDL
Cholesterol: 105 mg/dL (ref <200)
HDL: 44 mg/dL (ref >=40)
LDL Cholesterol (Calc): 41 mg/dL (calc)
Non-HDL Cholesterol (Calc): 61 mg/dL (calc) (ref <130)
Total CHOL/HDL Ratio: 2.4 (calc) (ref <5.0)
Triglycerides: 112 mg/dL (ref <150)

## 2019-12-19 LAB — COMPLETE METABOLIC PANEL WITH GFR
AG Ratio: 1.5 (calc) (ref 1.0–2.5)
ALT: 30 U/L (ref 9–46)
AST: 14 U/L (ref 10–35)
Albumin: 4.1 g/dL (ref 3.6–5.1)
Alkaline phosphatase (APISO): 62 U/L (ref 35–144)
BUN/Creatinine Ratio: 9 (calc) (ref 6–22)
BUN: 21 mg/dL (ref 7–25)
CO2: 25 mmol/L (ref 20–32)
Calcium: 9.3 mg/dL (ref 8.6–10.3)
Chloride: 108 mmol/L (ref 98–110)
Creat: 2.36 mg/dL — ABNORMAL HIGH (ref 0.70–1.25)
GFR, Est African American: 32 mL/min/{1.73_m2} — ABNORMAL LOW (ref >=60)
GFR, Est Non African American: 28 mL/min/{1.73_m2} — ABNORMAL LOW (ref >=60)
Globulin: 2.8 g/dL (calc) (ref 1.9–3.7)
Glucose, Bld: 108 mg/dL — ABNORMAL HIGH (ref 65–99)
Potassium: 4.5 mmol/L (ref 3.5–5.3)
Sodium: 141 mmol/L (ref 135–146)
Total Bilirubin: 0.4 mg/dL (ref 0.2–1.2)
Total Protein: 6.9 g/dL (ref 6.1–8.1)

## 2019-12-19 LAB — POCT GLYCOSYLATED HEMOGLOBIN (HGB A1C): Hemoglobin A1C: 5.6 % (ref 4.0–5.6)

## 2019-12-19 LAB — PSA: PSA: 2.8 ng/mL (ref <=4.0)

## 2019-12-19 MED ORDER — PANTOPRAZOLE SODIUM 40 MG PO TBEC: 40.0000 mg | DELAYED_RELEASE_TABLET | Freq: Two times a day (BID) | ORAL | 0 refills | Status: DC

## 2019-12-19 MED ORDER — METOPROLOL TARTRATE 100 MG PO TABS: ORAL_TABLET | ORAL | 1 refills | Status: DC

## 2019-12-19 MED ORDER — AMLODIPINE BESYLATE 5 MG PO TABS: 5.0000 mg | ORAL_TABLET | Freq: Every day | ORAL | 3 refills | Status: DC

## 2019-12-19 MED ORDER — ATORVASTATIN CALCIUM 40 MG PO TABS: 40.0000 mg | ORAL_TABLET | Freq: Every day | ORAL | 3 refills | Status: DC

## 2019-12-19 MED ORDER — TRELEGY ELLIPTA 100-62.5-25 MCG/INH IN AEPB: 1.0000 | INHALATION_SPRAY | Freq: Every day | RESPIRATORY_TRACT | 5 refills | Status: DC

## 2019-12-19 MED ORDER — HYDRALAZINE HCL 100 MG PO TABS: 100.0000 mg | ORAL_TABLET | Freq: Two times a day (BID) | ORAL | 1 refills | Status: DC

## 2019-12-19 MED ORDER — FUROSEMIDE 20 MG PO TABS: 20.0000 mg | ORAL_TABLET | Freq: Two times a day (BID) | ORAL | 5 refills | Status: DC

## 2019-12-19 MED ORDER — TRULICITY 1.5 MG/0.5ML ~~LOC~~ SOAJ: SUBCUTANEOUS | 0 refills | Status: DC

## 2019-12-19 NOTE — Progress Notes (Signed)
Subjective:    Patient ID: Jeremiah Thomas, male    DOB: 1956/03/31, 64 y.o.   MRN: [id number]  HPI  Pt is a 64 yo male with HTN, OSA, chronic respiratory failure, T2DM, CKD, BPH, history of PE and Covid who presents to the clinic for medication refills and 3 month follow up.   Pt is doing "ok". He continues to have problems breathing. He becomes out of breath very easy.  He has made his shortness of breath much worse.  He uses his CPAP at night to sleep.  He is not using any inhalers.  He does not smoke at this time.  He denies any bilateral leg pain or worsening edema.  He has been to his nephrology appointment and per patient his kidney function is improving and they have extended his follow-up.  Patient is not checking his sugars regularly but he is taking his Trulicity.  He has taken off his Metformin due to his kidney function and does not need to be on Januvia since he is on Trulicity.  Patient is checking his blood pressures at home and they are always in the 120s 130s over 70s or 80s.  He denies any chest pain or palpitations.  He continues to be short of breath but no worsening swelling. 2L continuous oxygen.  Having a lot of trouble sleeping flat.   Patient does complain of bilateral hand numbness and tingling.  Does not seem to radiate up into his forearm or shoulder.  Feels like he is losing strength in his hands.  .. Active Ambulatory Problems    Diagnosis Date Noted  . History of stroke 05/20/2016  . Asymptomatic hypertensive urgency 05/20/2016  . Acute kidney injury ([place]) 06/02/2016  . Essential hypertension, benign 06/02/2016  . CKD (chronic kidney disease) stage 3, GFR 30-59 ml/min 06/03/2016  . Frequent urination at night 05/13/2017  . Elevated PSA 07/20/2017  . Elevated fasting glucose 07/20/2017  . Nasal congestion 08/15/2017  . Morbidly obese ([place]) 09/08/2017  . Type II diabetes mellitus, uncontrolled ([place]) 09/13/2017  . Prostate cancer ([place]) 10/22/2017  . Bilateral  leg edema 02/21/2018  . Craving for particular food 02/21/2018  . Newly recognized heart murmur 02/21/2018  . 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  . Non-restorative sleep 06/13/2018  . Snoring 06/13/2018  . No energy 06/13/2018  . OSA (obstructive sleep apnea) 08/22/2018  . Paresthesia 09/12/2018  . Lightheaded 09/12/2018  . Orthostatic hypotension 12/07/2018  . Pneumonia due to COVID-19 virus 08/25/2019  . Chronic respiratory failure with hypoxia ([place]) 08/25/2019  . Elevated lipase 08/25/2019  . Low TSH level 08/25/2019  . Bilateral pulmonary embolism ([place]) 08/25/2019  . Lower GI bleed 08/25/2019  . Malnutrition of mild degree ([place]) 08/25/2019  . Serum albumin decreased 08/25/2019  . Anemia 08/25/2019  . History of COVID-19 09/20/2019  . 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 Depressed mood 09/20/2019  . Former smoker 12/19/2019  . Benign prostatic hyperplasia without lower urinary tract symptoms 12/19/2019   Resolved Ambulatory Problems    Diagnosis Date Noted  . No Resolved Ambulatory Problems   Past Medical History:  Diagnosis Date  . Hypertension      Review of Systems  All other systems reviewed and are negative.      Objective:   Physical Exam Vitals reviewed.  Constitutional:      Appearance: Normal appearance. He is obese.  HENT:     Head: Normocephalic.  Cardiovascular:     Rate and Rhythm: Normal rate and regular rhythm.     Pulses: Normal pulses.     Heart sounds: Murmur heard.   Pulmonary:     Breath sounds: Normal breath sounds. No wheezing or rhonchi.     Comments: On 2L o O2.  Musculoskeletal:     Right lower leg: Edema present.     Left lower leg: Edema present.     Comments: Scant bilateral ankle edema.   Neurological:     General: No focal deficit present.     Mental Status: He is alert and oriented to person, place, and time.  Psychiatric:        Mood and  Affect: Mood normal.        Behavior: Behavior normal.           Assessment & Plan:   .Marland KitchenJohn was seen today for diabetes.  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 -     hydrALAZINE (APRESOLINE) 100 MG tablet; Take 1 tablet (100 mg total) by mouth 2 (two) times daily. -     metoprolol tartrate (LOPRESSOR) 100 MG tablet; TAKE 1 TABLET BY MOUTH TWICE DAILY(BETA BLOCKER)  History of stroke -     atorvastatin (LIPITOR) 40 MG tablet; Take 1 tablet (40 mg total) by mouth daily.  Bilateral leg edema -     furosemide (LASIX) 20 MG tablet; Take 1 tablet (20 mg total) by mouth 2 (two) times daily.  Hyperlipidemia associated with type 2 diabetes mellitus ([place]) -     Lipid Panel w/reflex Direct LDL  Benign prostatic hyperplasia without lower urinary tract symptoms -     PSA  Paresthesia -     B12 and Folate Panel -     NCV with EMG(electromyography)  Bilateral pulmonary embolism (HCC)  Chronic respiratory failure with hypoxia (HCC) -     Fluticasone-Umeclidin-Vilant (TRELEGY ELLIPTA) 100-62.5-25 MCG/INH AEPB; Inhale 1 puff into the lungs daily. -     CT Chest Wo Contrast -     ECHOCARDIOGRAM COMPLETE  OSA (obstructive sleep apnea)  Stage 3b chronic kidney disease  Paresthesias  Depressed mood  Former smoker -     CT Chest Wo Contrast  History of COVID-19 -     Fluticasone-Umeclidin-Vilant (TRELEGY ELLIPTA) 100-62.5-25 MCG/INH AEPB; Inhale 1 puff into the lungs daily. -     CT Chest Wo Contrast -      ECHOCARDIOGRAM COMPLETE  Gastroesophageal reflux disease, unspecified whether esophagitis present -     pantoprazole (PROTONIX) 40 MG tablet; Take 1 tablet (40 mg total) by mouth 2 (two) times daily.  Murmur -     ECHOCARDIOGRAM COMPLETE  Bilateral hand numbness -     NCV with EMG(electromyography)   Labs ordered to follow up on ongoing illness.  .. Lab Results  Component Value Date   HGBA1C 5.6 12/19/2019   A1c looks great. Continue on same  regimen. Continue to keep diabetic diet. On ARB blood pressure not controlled today at visit.  Patient reports checking blood pressure in the 120s over 70s at home.  Will not make any changes today as his last few readings were good in office. On statin Foot exam up-to-date Needs eye exam  Patient has really struggled to breathe since his Covid 19 infection which resulted in bilateral pulmonary embolisms.  He is off anticoagulation.  He continues to use around 2 L of oxygen continuously.  He also has his sleep apnea machine that he uses at night.  That he has made his shortness of breath worse.  I did go ahead and start him on Trelegy today.  I would like to get a CT scan of his lungs to see the status since COVID-19 infection.  He is seeing nephrology for his CKD that had progressed into stage IV.  Per patient his kidney function is improving and they increase his follow-up to every 4 to 6 months with nephrology.  Possible bilateral carpal tunnel. Will get EMG. Will also check b12/TSH/CBC in labs.     Marland KitchenMarland Kitchen

## 2019-12-20 ENCOUNTER — Other Ambulatory Visit: Payer: Self-pay

## 2019-12-20 MED ORDER — ALBUTEROL SULFATE HFA 108 (90 BASE) MCG/ACT IN AERS: 2.0000 | INHALATION_SPRAY | Freq: Four times a day (QID) | RESPIRATORY_TRACT | 0 refills | Status: DC | PRN

## 2019-12-20 NOTE — Progress Notes (Signed)
Call pt:  Cholesterol is great.  PSA improved more from 1 year ago.  B12 good.  Kidney function is up from when I checked last but still in stage 4 CKD at this point.   Plan stays the same.

## 2019-12-22 NOTE — Patient Instructions (Signed)
Need to get Chest CT.  meds stay the same.  Get labs.

## 2019-12-27 ENCOUNTER — Ambulatory Visit (INDEPENDENT_AMBULATORY_CARE_PROVIDER_SITE_OTHER): Payer: 59

## 2019-12-27 ENCOUNTER — Other Ambulatory Visit: Payer: Self-pay

## 2019-12-27 DIAGNOSIS — J9611 Chronic respiratory failure with hypoxia: Secondary | ICD-10-CM | POA: Diagnosis not present

## 2019-12-27 DIAGNOSIS — Z8616 Personal history of COVID-19: Secondary | ICD-10-CM | POA: Diagnosis not present

## 2019-12-27 DIAGNOSIS — Z87891 Personal history of nicotine dependence: Secondary | ICD-10-CM

## 2019-12-27 NOTE — Progress Notes (Signed)
CT shows what appears to be scarring and interstitial lung changes likely from COVID. No acute findings.

## 2020-01-12 ENCOUNTER — Other Ambulatory Visit: Payer: Self-pay | Admitting: Medical-Surgical

## 2020-01-16 ENCOUNTER — Other Ambulatory Visit: Payer: Self-pay | Admitting: Family Medicine

## 2020-01-16 ENCOUNTER — Other Ambulatory Visit: Payer: Self-pay | Admitting: Physician Assistant

## 2020-01-16 DIAGNOSIS — I10 Essential (primary) hypertension: Secondary | ICD-10-CM

## 2020-01-16 DIAGNOSIS — K219 Gastro-esophageal reflux disease without esophagitis: Secondary | ICD-10-CM

## 2020-01-18 ENCOUNTER — Ambulatory Visit (HOSPITAL_BASED_OUTPATIENT_CLINIC_OR_DEPARTMENT_OTHER): Admission: RE | Admit: 2020-01-18 | Payer: 59 | Source: Ambulatory Visit

## 2020-02-16 ENCOUNTER — Other Ambulatory Visit: Payer: Self-pay | Admitting: Neurology

## 2020-02-16 MED ORDER — ALBUTEROL SULFATE HFA 108 (90 BASE) MCG/ACT IN AERS: INHALATION_SPRAY | RESPIRATORY_TRACT | 0 refills | Status: DC

## 2020-02-20 ENCOUNTER — Other Ambulatory Visit: Payer: Self-pay | Admitting: Physician Assistant

## 2020-02-20 DIAGNOSIS — K219 Gastro-esophageal reflux disease without esophagitis: Secondary | ICD-10-CM

## 2020-02-28 ENCOUNTER — Other Ambulatory Visit: Payer: Self-pay | Admitting: Physician Assistant

## 2020-02-28 DIAGNOSIS — I10 Essential (primary) hypertension: Secondary | ICD-10-CM

## 2020-03-04 ENCOUNTER — Other Ambulatory Visit: Payer: Self-pay

## 2020-03-04 ENCOUNTER — Ambulatory Visit (HOSPITAL_BASED_OUTPATIENT_CLINIC_OR_DEPARTMENT_OTHER)
Admission: RE | Admit: 2020-03-04 | Discharge: 2020-03-04 | Disposition: A | Discharge status: Home / Self Care | Payer: Medicare Other | Source: Ambulatory Visit | Attending: Physician Assistant | Admitting: Physician Assistant

## 2020-03-04 DIAGNOSIS — J9611 Chronic respiratory failure with hypoxia: Secondary | ICD-10-CM | POA: Diagnosis present

## 2020-03-04 DIAGNOSIS — Z8616 Personal history of COVID-19: Secondary | ICD-10-CM

## 2020-03-04 DIAGNOSIS — R011 Cardiac murmur, unspecified: Secondary | ICD-10-CM | POA: Diagnosis present

## 2020-03-04 LAB — ECHOCARDIOGRAM COMPLETE
Area-P 1/2: 5.97 cm2
S' Lateral: 2.78 cm

## 2020-03-04 NOTE — Progress Notes (Signed)
Call pt:  EF good at 60-65.  You do have some grade III diastolic dysfunction.  No significant findings.  Overall stable findings.

## 2020-03-09 ENCOUNTER — Other Ambulatory Visit: Payer: Self-pay | Admitting: Physician Assistant

## 2020-03-12 ENCOUNTER — Other Ambulatory Visit: Payer: Self-pay | Admitting: Physician Assistant

## 2020-03-12 DIAGNOSIS — E11620 Type 2 diabetes mellitus with diabetic dermatitis: Secondary | ICD-10-CM

## 2020-03-20 ENCOUNTER — Ambulatory Visit (INDEPENDENT_AMBULATORY_CARE_PROVIDER_SITE_OTHER): Payer: Medicare Other | Admitting: Physician Assistant

## 2020-03-20 ENCOUNTER — Encounter: Payer: Self-pay | Admitting: Physician Assistant

## 2020-03-20 VITALS — BP 123/68 | HR 89 | Ht 74.0 in | Wt 302.0 lb

## 2020-03-20 DIAGNOSIS — Z7409 Other reduced mobility: Secondary | ICD-10-CM

## 2020-03-20 DIAGNOSIS — J9611 Chronic respiratory failure with hypoxia: Secondary | ICD-10-CM

## 2020-03-20 DIAGNOSIS — U099 Post covid-19 condition, unspecified: Secondary | ICD-10-CM

## 2020-03-20 DIAGNOSIS — E11620 Type 2 diabetes mellitus with diabetic dermatitis: Secondary | ICD-10-CM | POA: Diagnosis not present

## 2020-03-20 DIAGNOSIS — I10 Essential (primary) hypertension: Secondary | ICD-10-CM | POA: Diagnosis not present

## 2020-03-20 DIAGNOSIS — G4733 Obstructive sleep apnea (adult) (pediatric): Secondary | ICD-10-CM

## 2020-03-20 DIAGNOSIS — R4589 Other symptoms and signs involving emotional state: Secondary | ICD-10-CM

## 2020-03-20 DIAGNOSIS — N1832 Chronic kidney disease, stage 3b: Secondary | ICD-10-CM

## 2020-03-20 HISTORY — DX: Other reduced mobility: Z74.09

## 2020-03-20 HISTORY — DX: Post covid-19 condition, unspecified: U09.9

## 2020-03-20 LAB — POCT GLYCOSYLATED HEMOGLOBIN (HGB A1C): Hemoglobin A1C: 6 % — AB (ref 4.0–5.6)

## 2020-03-20 MED ORDER — DULOXETINE HCL 30 MG PO CPEP: 30.0000 mg | ORAL_CAPSULE | Freq: Every day | ORAL | 0 refills | Status: DC

## 2020-03-20 NOTE — Progress Notes (Addendum)
Subjective:    Patient ID: Jeremiah Thomas, male    DOB: [birth year], 64 y.o.   MRN: [id number]  HPI [name] is a pleasant 64 year old male presenting today for a follow up for his diabetes. His diabetes is well controlled today with A1c 6.0.   He is having additional problems today. These include continued shortness of breath and feeling dizzy upon standing since suffering from Covid-19, and weakness/numbness in his hands. He states that he is having a hard time doing things that he once found easy such as opening milk jugs.The hand numbness is mostly on the palmar aspect with pointer and middle finger involvement. He also is experiencing increased episodes of dizziness when standing. These episodes happen daily and have caused him to loose his balance and have to catch himself to stop from falling. He has fallen from this but denies injury or hitting his head. His shortness of breath is improving some since the previous visit, with him able to only rely on oxygen a couple times a week when he gets very short of breath. He is using the albuterol inhaler daily which also provides a lot of relief.   [name] also states that he is having problems with his mood. He is feeling very down about his health and frustrated with his loss of strength. He believes that he is depressed at this time. He denies suicidal thoughts or homicidal ideations.     .. Active Ambulatory Problems    Diagnosis Date Noted  . History of stroke 05/20/2016  . Asymptomatic hypertensive urgency 05/20/2016  . Acute kidney injury ([place]) 06/02/2016  . Essential hypertension, benign 06/02/2016  . CKD (chronic kidney disease) stage 3, GFR 30-59 ml/min (HCC) 06/03/2016  . Frequent urination at night 05/13/2017  . Elevated PSA 07/20/2017  . Elevated fasting glucose 07/20/2017  . Nasal congestion 08/15/2017  . Morbidly obese ([place]) 09/08/2017  . Type II diabetes mellitus, uncontrolled ([place]) 09/13/2017  . Prostate cancer ([place]) 10/22/2017  .  Bilateral leg edema 02/21/2018  . Craving for particular food 02/21/2018  . Newly recognized heart murmur 02/21/2018  . 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  . Non-restorative sleep 06/13/2018  . Snoring 06/13/2018  . No energy 06/13/2018  . OSA (obstructive sleep apnea) 08/22/2018  . Paresthesia 09/12/2018  . Lightheaded 09/12/2018  . Orthostatic hypotension 12/07/2018  . Pneumonia due to COVID-19 virus 08/25/2019  . Chronic respiratory failure with hypoxia ([place]) 08/25/2019  . Elevated lipase 08/25/2019  . Low TSH level 08/25/2019  . Bilateral pulmonary embolism ([place]) 08/25/2019  . Lower GI bleed 08/25/2019  . Malnutrition of mild degree ([place]) 08/25/2019  . Serum albumin decreased 08/25/2019  . Anemia 08/25/2019  . History of COVID-19 09/20/2019  . 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 Depressed mood 09/20/2019  . Former smoker 12/19/2019  . Benign prostatic hyperplasia without lower urinary tract symptoms 12/19/2019  . 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 No Resolved Ambulatory Problems   Past Medical History:  Diagnosis Date  . Hypertension     .Marland Kitchen Lab Results  Component Value Date   HGBA1C 6.0 (A) 03/20/2020      Review of Systems  Constitutional: Positive for fatigue. Negative for chills and fever.  HENT: Negative for congestion, sinus pain and sore throat.   Respiratory: Positive for chest tightness and shortness of breath.  Negative for cough.   Cardiovascular: Positive for leg swelling. Negative for chest pain and palpitations.  Gastrointestinal: Negative for blood in stool, constipation and diarrhea.  Genitourinary: Negative for difficulty urinating, dysuria and urgency.  Musculoskeletal: Positive for back pain. Negative for gait problem and neck pain.  Neurological:  Positive for dizziness, weakness, light-headedness and numbness. Negative for tremors, facial asymmetry, speech difficulty and headaches.  Psychiatric/Behavioral: Positive for dysphoric mood. Negative for sleep disturbance and suicidal ideas.       Objective:   Physical Exam HENT:     Head: Normocephalic and atraumatic.  Eyes:     Extraocular Movements: Extraocular movements intact.  Cardiovascular:     Rate and Rhythm: Normal rate and regular rhythm.     Pulses: Normal pulses.     Heart sounds: Normal heart sounds.  Pulmonary:     Effort: Pulmonary effort is normal.     Breath sounds: Normal breath sounds. No stridor. No wheezing or rhonchi.  Musculoskeletal:        General: No tenderness.     Right lower leg: Edema present.     Left lower leg: Edema present.  Skin:    General: Skin is warm and dry.  Neurological:     Mental Status: He is alert and oriented to person, place, and time.  Psychiatric:        Behavior: Behavior normal.        Thought Content: Thought content normal.    .. Depression screen [facility] 2/9 12/19/2019 09/18/2019 06/06/2018 03/08/2018 05/11/2017  Decreased Interest 2 2 3 1 1   Down, Depressed, Hopeless 1 1 1 1 1   PHQ - 2 Score 3 3 4 2 2   Altered sleeping 0 2 2 3  0  Tired, decreased energy 1 3 3 3 1   Change in appetite 1 0 2 2 1   Feeling bad or failure about yourself  1 1 1 1 1   Trouble concentrating 2 0 3 3 0  Moving slowly or fidgety/restless 0 1 1 1 1   Suicidal thoughts 0 0 0 0 0  PHQ-9 Score 8 10 16 15 6   Difficult doing work/chores - Not difficult at all Very difficult Somewhat difficult -   .. GAD 7 : Generalized Anxiety Score 12/19/2019 09/18/2019 06/06/2018 03/08/2018  Nervous, Anxious, on Edge 0 1 1 2   Control/stop worrying 1 1 1 2   Worry too much - different things 1 1 1 3   Trouble relaxing 1 1 2 3   Restless 1 0 1 2  Easily annoyed or irritable 1 1 2 3   Afraid - awful might happen 0 1 2 0  Total GAD 7 Score 5 6 10 15   Anxiety Difficulty  Somewhat difficult Somewhat difficult Somewhat difficult Very difficult     Diabetic Foot Exam - Simple   Simple Foot Form Diabetic Foot exam was performed with the following findings: Yes 03/20/2020  9:11 AM  Visual Inspection No deformities, no ulcerations, no other skin breakdown bilaterally: Yes See comments: Yes Sensation Testing Intact to touch and monofilament testing bilaterally: Yes Pulse Check See comments: Yes Comments Toenails show Onychomycosis, unable to feel pulses bilaterally, provider to examine   Pulses reassessed and posterior tibialis was 2+ bilaterally. Initial exam made difficult due to edema. No ulcers or abscesses noted but ichthyosis present on both lower extremeties       Assessment & Plan:  .Marland KitchenJohn was seen today for diabetes.  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   Pt is doing better since his long hauler symptoms of covid. He is off oxygen for the most part. He has pulled it out a few times with exertion. He continues to regain his strength.   A1C is controlled.  Continue on medications.  BP controlled.  On statin.  Needs eye exam.  Pt declines covid/flu/pneumonia vaccine.  Follow up in 3 months.   Pt mood is down. covid has really taken a lot of strength away from him. Start cymbalta.  Call if makes mood worse.   Marland KitchenVernetta Honey PA-C, have reviewed and agree with the above documentation in it's entirety.

## 2020-03-20 NOTE — Patient Instructions (Signed)
cymbalta daily.

## 2020-06-06 ENCOUNTER — Other Ambulatory Visit: Payer: Self-pay | Admitting: Physician Assistant

## 2020-06-06 DIAGNOSIS — C61 Malignant neoplasm of prostate: Secondary | ICD-10-CM

## 2020-06-06 DIAGNOSIS — R351 Nocturia: Secondary | ICD-10-CM

## 2020-06-10 ENCOUNTER — Other Ambulatory Visit: Payer: Self-pay | Admitting: Physician Assistant

## 2020-06-10 DIAGNOSIS — E11620 Type 2 diabetes mellitus with diabetic dermatitis: Secondary | ICD-10-CM

## 2020-06-11 ENCOUNTER — Other Ambulatory Visit: Payer: Self-pay | Admitting: Physician Assistant

## 2020-06-11 DIAGNOSIS — E11620 Type 2 diabetes mellitus with diabetic dermatitis: Secondary | ICD-10-CM

## 2020-06-15 ENCOUNTER — Other Ambulatory Visit: Payer: Self-pay | Admitting: Physician Assistant

## 2020-06-15 DIAGNOSIS — R4589 Other symptoms and signs involving emotional state: Secondary | ICD-10-CM

## 2020-06-21 ENCOUNTER — Ambulatory Visit (INDEPENDENT_AMBULATORY_CARE_PROVIDER_SITE_OTHER): Payer: Medicare Other | Admitting: Physician Assistant

## 2020-06-21 ENCOUNTER — Other Ambulatory Visit: Payer: Self-pay

## 2020-06-21 ENCOUNTER — Encounter: Payer: Self-pay | Admitting: Physician Assistant

## 2020-06-21 VITALS — BP 126/76 | HR 90 | Wt 296.0 lb

## 2020-06-21 DIAGNOSIS — C61 Malignant neoplasm of prostate: Secondary | ICD-10-CM

## 2020-06-21 DIAGNOSIS — Z8673 Personal history of transient ischemic attack (TIA), and cerebral infarction without residual deficits: Secondary | ICD-10-CM

## 2020-06-21 DIAGNOSIS — N184 Chronic kidney disease, stage 4 (severe): Secondary | ICD-10-CM

## 2020-06-21 DIAGNOSIS — Z6841 Body Mass Index (BMI) 40.0 and over, adult: Secondary | ICD-10-CM

## 2020-06-21 DIAGNOSIS — I10 Essential (primary) hypertension: Secondary | ICD-10-CM

## 2020-06-21 DIAGNOSIS — J9611 Chronic respiratory failure with hypoxia: Secondary | ICD-10-CM | POA: Diagnosis not present

## 2020-06-21 DIAGNOSIS — E11620 Type 2 diabetes mellitus with diabetic dermatitis: Secondary | ICD-10-CM

## 2020-06-21 DIAGNOSIS — R600 Localized edema: Secondary | ICD-10-CM

## 2020-06-21 DIAGNOSIS — K219 Gastro-esophageal reflux disease without esophagitis: Secondary | ICD-10-CM

## 2020-06-21 DIAGNOSIS — R351 Nocturia: Secondary | ICD-10-CM

## 2020-06-21 DIAGNOSIS — Z8616 Personal history of COVID-19: Secondary | ICD-10-CM

## 2020-06-21 DIAGNOSIS — N1832 Chronic kidney disease, stage 3b: Secondary | ICD-10-CM

## 2020-06-21 DIAGNOSIS — Z1159 Encounter for screening for other viral diseases: Secondary | ICD-10-CM

## 2020-06-21 DIAGNOSIS — R4589 Other symptoms and signs involving emotional state: Secondary | ICD-10-CM

## 2020-06-21 LAB — POCT GLYCOSYLATED HEMOGLOBIN (HGB A1C): Hemoglobin A1C: 6.4 % — AB (ref 4.0–5.6)

## 2020-06-21 MED ORDER — AMLODIPINE BESYLATE 5 MG PO TABS: 5.0000 mg | ORAL_TABLET | Freq: Every day | ORAL | 3 refills | Status: DC

## 2020-06-21 MED ORDER — DULOXETINE HCL 30 MG PO CPEP
ORAL_CAPSULE | ORAL | 1 refills | Status: DC
Start: 2020-06-21 — End: 2020-10-18

## 2020-06-21 MED ORDER — HYDRALAZINE HCL 100 MG PO TABS
100.0000 mg | ORAL_TABLET | Freq: Two times a day (BID) | ORAL | 1 refills | Status: DC
Start: 2020-06-21 — End: 2020-10-18

## 2020-06-21 MED ORDER — TRELEGY ELLIPTA 100-62.5-25 MCG/INH IN AEPB: 1.0000 | INHALATION_SPRAY | Freq: Every day | RESPIRATORY_TRACT | 5 refills | Status: DC

## 2020-06-21 MED ORDER — FUROSEMIDE 20 MG PO TABS: 20.0000 mg | ORAL_TABLET | Freq: Two times a day (BID) | ORAL | 1 refills | Status: DC

## 2020-06-21 MED ORDER — PANTOPRAZOLE SODIUM 40 MG PO TBEC: DELAYED_RELEASE_TABLET | ORAL | 1 refills | Status: DC

## 2020-06-21 MED ORDER — METOPROLOL TARTRATE 100 MG PO TABS
ORAL_TABLET | ORAL | 1 refills | Status: DC
Start: 2020-06-21 — End: 2020-10-18

## 2020-06-21 MED ORDER — FINASTERIDE 5 MG PO TABS: ORAL_TABLET | ORAL | 1 refills | Status: DC

## 2020-06-21 MED ORDER — TRULICITY 1.5 MG/0.5ML ~~LOC~~ SOAJ
SUBCUTANEOUS | 0 refills | Status: DC
Start: 2020-06-21 — End: 2020-10-18

## 2020-06-21 NOTE — Progress Notes (Signed)
Subjective:    Patient ID: Jeremiah Thomas, male    DOB: Mar 05, 1956, 65 y.o.   MRN: [id number]  HPI Pt is a 65 yo obese male with T2DM, HTN, OSA, CKD-4, HLD who presents to the clinic for medication refills and follow up.   Overall patient seems to be improving since COVID last year.  He is not using any oxygen.  He is not using any type of walker or cane to assist walking.  Patient is fairly compliant with his medication.  He is not checking his blood pressures or sugars at home.  He admits he is not really watching his diet.  He is more active than he used to be but still does not have a lot of energy.  He does use his CPAP every night.  Patient denies any open sores or wounds.  .. Active Ambulatory Problems    Diagnosis Date Noted   History of stroke 05/20/2016   Asymptomatic hypertensive urgency 05/20/2016   Acute kidney injury ([place]) 06/02/2016   Essential hypertension, benign 06/02/2016   CKD (chronic kidney disease) stage 3, GFR 30-59 ml/min (HCC) 06/03/2016   Frequent urination at night 05/13/2017   Elevated PSA 07/20/2017   Elevated fasting glucose 07/20/2017   Nasal congestion 08/15/2017   Morbidly obese ([place]) 09/08/2017   Type II diabetes mellitus, uncontrolled ([place]) 09/13/2017   Prostate cancer ([place]) 10/22/2017   Bilateral leg edema 02/21/2018   Craving for particular food 02/21/2018   Newly recognized heart murmur 02/21/2018   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   Non-restorative sleep 06/13/2018   Snoring 06/13/2018   No energy 06/13/2018   OSA (obstructive sleep apnea) 08/22/2018   Paresthesia 09/12/2018   Lightheaded 09/12/2018   Orthostatic hypotension 12/07/2018   Pneumonia due to COVID-19 virus 08/25/2019   Chronic respiratory failure with hypoxia (HCC) 08/25/2019   Elevated lipase 08/25/2019   Low TSH level 08/25/2019   Bilateral pulmonary embolism ([place]) 08/25/2019   Lower GI bleed 08/25/2019   Malnutrition of mild degree ([place])  08/25/2019   Serum albumin decreased 08/25/2019   Anemia 08/25/2019   History of COVID-19 09/20/2019   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      Objective:   Physical Exam Vitals reviewed.  Constitutional:      Appearance: Normal appearance. He is obese.  HENT:     Head: Normocephalic.  Neck:     Vascular: No carotid bruit.  Cardiovascular:     Rate and Rhythm: Normal rate and regular rhythm.     Pulses: Normal pulses.     Heart sounds: Normal heart sounds.  Pulmonary:     Effort: Pulmonary effort is normal.     Breath sounds: Normal breath sounds.  Musculoskeletal:     Right lower leg: No edema.     Left lower leg: No edema.  Neurological:     General: No focal deficit present.     Mental Status: He is alert and oriented to person, place, and time.  Psychiatric:        Mood and Affect: Mood normal.           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  Chronic respiratory failure with hypoxia (HCC) -     Fluticasone-Umeclidin-Vilant (TRELEGY ELLIPTA) 100-62.5-25 MCG/INH AEPB; Inhale 1 puff into the lungs daily.  History of COVID-19 -     Fluticasone-Umeclidin-Vilant (TRELEGY ELLIPTA) 100-62.5-25 MCG/INH AEPB; Inhale 1 puff into the lungs daily.  Depressed mood -     DULoxetine (CYMBALTA) 30 MG capsule; TAKE 1 CAPSULE(30 MG) BY MOUTH DAILY  Essential hypertension, benign -     amLODipine (NORVASC) 5 MG tablet; Take 1 tablet (5 mg total) by mouth daily. -     hydrALAZINE (APRESOLINE) 100 MG tablet; Take 1 tablet (100 mg total) by mouth 2 (two) times daily. -     metoprolol tartrate (LOPRESSOR) 100 MG tablet; TAKE 1 TABLET BY MOUTH TWICE DAILY  Frequent urination at night -     finasteride (PROSCAR) 5 MG tablet; TAKE 1 TABLET(5 MG) BY MOUTH DAILY  Prostate cancer (HCC) -     finasteride (PROSCAR) 5 MG tablet; TAKE 1 TABLET(5 MG) BY MOUTH DAILY  Gastroesophageal reflux disease, unspecified whether esophagitis present -     pantoprazole (PROTONIX) 40 MG tablet; TAKE 1 TABLET(40 MG) BY MOUTH TWICE DAILY  History of stroke  CKD (chronic kidney disease) stage 4, GFR 15-29 ml/min (HCC) -     COMPLETE METABOLIC PANEL WITH GFR  Encounter for hepatitis C screening test for low risk patient -     Hepatitis C Antibody   Needs Labs.   .. Results for orders placed or performed in visit on 06/21/20  POCT glycosylated hemoglobin (Hb A1C)  Result Value Ref Range   Hemoglobin A1C 6.4 (A) 4.0 - 5.6 %   HbA1c POC (<> result, manual entry)     HbA1c, POC (prediabetic range)     HbA1c, POC (controlled diabetic range)     A1C stable.  Stay on same medications.  BP to goal.  On statin.  Foot exam UTD.  Needs eye exam. Declined all vaccines.   CKD-4 seeing nephrologist. cmp ordered today.   Medications refilled.    Follow up in 3 months.

## 2020-06-24 ENCOUNTER — Other Ambulatory Visit: Payer: Self-pay | Admitting: Physician Assistant

## 2020-06-24 DIAGNOSIS — Z8616 Personal history of COVID-19: Secondary | ICD-10-CM

## 2020-06-24 DIAGNOSIS — J9611 Chronic respiratory failure with hypoxia: Secondary | ICD-10-CM

## 2020-06-24 DIAGNOSIS — R600 Localized edema: Secondary | ICD-10-CM

## 2020-09-20 ENCOUNTER — Ambulatory Visit: Payer: 59 | Admitting: Physician Assistant

## 2020-09-20 ENCOUNTER — Ambulatory Visit (INDEPENDENT_AMBULATORY_CARE_PROVIDER_SITE_OTHER): Payer: Medicare Other | Admitting: Family Medicine

## 2020-09-20 ENCOUNTER — Other Ambulatory Visit: Payer: Self-pay

## 2020-09-20 ENCOUNTER — Encounter: Payer: Self-pay | Admitting: Family Medicine

## 2020-09-20 VITALS — Ht 74.0 in | Wt 300.0 lb

## 2020-09-20 DIAGNOSIS — I951 Orthostatic hypotension: Secondary | ICD-10-CM | POA: Diagnosis not present

## 2020-09-20 DIAGNOSIS — R42 Dizziness and giddiness: Secondary | ICD-10-CM

## 2020-09-20 NOTE — Patient Instructions (Signed)
Reduce hydralazine to 1/2 tablet twice per day.  Have labs completed.  Follow up in 3-4 weeks.    Orthostatic Hypotension Blood pressure is a measurement of how strongly, or weakly, your blood is pressing against the walls of your arteries. Orthostatic hypotension is a sudden drop in blood pressure that happens when you quickly change positions, such as when you get up from sitting or lying down. Arteries are blood vessels that carry blood from your heart throughout your body. When blood pressure is too low, you may not get enough blood to your brain or to the rest of your organs. This can cause weakness, light-headedness, rapid heartbeat, and fainting. This can last for just a few seconds or for up to a few minutes. Orthostatic hypotension is usually not a serious problem. However, if it happens frequently or gets worse, it may be a sign of something more serious. What are the causes? This condition may be caused by:  Sudden changes in posture, such as standing up quickly after you have been sitting or lying down.  Blood loss.  Loss of body fluids (dehydration).  Heart problems.  Hormone (endocrine) problems.  Pregnancy.  Severe infection.  Lack of certain nutrients.  Severe allergic reactions (anaphylaxis).  Certain medicines, such as blood pressure medicine or medicines that make the body lose excess fluids (diuretics). Sometimes, this condition can be caused by not taking medicine as directed, such as taking too much of a certain medicine. What increases the risk? The following factors may make you more likely to develop this condition:  Age. Risk increases as you get older.  Conditions that affect the heart or the central nervous system.  Taking certain medicines, such as blood pressure medicine or diuretics.  Being pregnant. What are the signs or symptoms? Symptoms of this condition may include:  Weakness.  Light-headedness.  Dizziness.  Blurred  vision.  Fatigue.  Rapid heartbeat.  Fainting, in severe cases. How is this diagnosed? This condition is diagnosed based on:  Your medical history.  Your symptoms.  Your blood pressure measurement. Your health care provider will check your blood pressure when you are: ? Lying down. ? Sitting. ? Standing. A blood pressure reading is recorded as two numbers, such as "120 over 80" (or 120/80). The first ("top") number is called the systolic pressure. It is a measure of the pressure in your arteries as your heart beats. The second ("bottom") number is called the diastolic pressure. It is a measure of the pressure in your arteries when your heart relaxes between beats. Blood pressure is measured in a unit called mm Hg. Healthy blood pressure for most adults is 120/80. If your blood pressure is below 90/60, you may be diagnosed with hypotension. Other information or tests that may be used to diagnose orthostatic hypotension include:  Your other vital signs, such as your heart rate and temperature.  Blood tests.  Tilt table test. For this test, you will be safely secured to a table that moves you from a lying position to an upright position. Your heart rhythm and blood pressure will be monitored during the test. How is this treated? This condition may be treated by:  Changing your diet. This may involve eating more salt (sodium) or drinking more water.  Taking medicines to raise your blood pressure.  Changing the dosage of certain medicines you are taking that might be lowering your blood pressure.  Wearing compression stockings. These stockings help to prevent blood clots and reduce swelling in  your legs. In some cases, you may need to go to the hospital for:  Fluid replacement. This means you will receive fluids through an IV.  Blood replacement. This means you will receive donated blood through an IV (transfusion).  Treating an infection or heart problems, if this  applies.  Monitoring. You may need to be monitored while medicines that you are taking wear off. Follow these instructions at home: Eating and drinking  Drink enough fluid to keep your urine pale yellow.  Eat a healthy diet, and follow instructions from your health care provider about eating or drinking restrictions. A healthy diet includes: ? Fresh fruits and vegetables. ? Whole grains. ? Lean meats. ? Low-fat dairy products.  Eat extra salt only as directed. Do not add extra salt to your diet unless your health care provider told you to do that.  Eat frequent, small meals.  Avoid standing up suddenly after eating.   Medicines  Take over-the-counter and prescription medicines only as told by your health care provider. ? Follow instructions from your health care provider about changing the dosage of your current medicines, if this applies. ? Do not stop or adjust any of your medicines on your own. General instructions  Wear compression stockings as told by your health care provider.  Get up slowly from lying down or sitting positions. This gives your blood pressure a chance to adjust.  Avoid hot showers and excessive heat as directed by your health care provider.  Return to your normal activities as told by your health care provider. Ask your health care provider what activities are safe for you.  Do not use any products that contain nicotine or tobacco, such as cigarettes, e-cigarettes, and chewing tobacco. If you need help quitting, ask your health care provider.  Keep all follow-up visits as told by your health care provider. This is important.   Contact a health care provider if you:  Vomit.  Have diarrhea.  Have a fever for more than 2-3 days.  Feel more thirsty than usual.  Feel weak and tired. Get help right away if you:  Have chest pain.  Have a fast or irregular heartbeat.  Develop numbness in any part of your body.  Cannot move your arms or your  legs.  Have trouble speaking.  Become sweaty or feel light-headed.  Faint.  Feel short of breath.  Have trouble staying awake.  Feel confused. Summary  Orthostatic hypotension is a sudden drop in blood pressure that happens when you quickly change positions.  Orthostatic hypotension is usually not a serious problem.  It is diagnosed by having your blood pressure taken lying down, sitting, and then standing.  It may be treated by changing your diet or adjusting your medicines. This information is not intended to replace advice given to you by your health care provider. Make sure you discuss any questions you have with your health care provider. Document Revised: 11/25/2017 Document Reviewed: 11/25/2017 Elsevier Patient Education  [place].

## 2020-09-21 LAB — CBC WITH DIFFERENTIAL/PLATELET
Absolute Monocytes: 865 cells/uL (ref 200–950)
Basophils Absolute: 169 cells/uL (ref 0–200)
Basophils Relative: 1.8 %
Eosinophils Absolute: 132 cells/uL (ref 15–500)
Eosinophils Relative: 1.4 %
HCT: 44.1 % (ref 38.5–50.0)
Hemoglobin: 14.2 g/dL (ref 13.2–17.1)
Lymphs Abs: 2773 cells/uL (ref 850–3900)
MCH: 28.9 pg (ref 27.0–33.0)
MCHC: 32.2 g/dL (ref 32.0–36.0)
MCV: 89.8 fL (ref 80.0–100.0)
MPV: 10.8 fL (ref 7.5–12.5)
Monocytes Relative: 9.2 %
Neutro Abs: 5461 cells/uL (ref 1500–7800)
Neutrophils Relative %: 58.1 %
Platelets: 263 10*3/uL (ref 140–400)
RBC: 4.91 10*6/uL (ref 4.20–5.80)
RDW: 13.5 % (ref 11.0–15.0)
Total Lymphocyte: 29.5 %
WBC: 9.4 10*3/uL (ref 3.8–10.8)

## 2020-09-21 LAB — COMPLETE METABOLIC PANEL WITH GFR
AG Ratio: 1.5 (calc) (ref 1.0–2.5)
ALT: 26 U/L (ref 9–46)
AST: 17 U/L (ref 10–35)
Albumin: 4.3 g/dL (ref 3.6–5.1)
Alkaline phosphatase (APISO): 87 U/L (ref 35–144)
BUN/Creatinine Ratio: 8 (calc) (ref 6–22)
BUN: 25 mg/dL (ref 7–25)
CO2: 29 mmol/L (ref 20–32)
Calcium: 9.7 mg/dL (ref 8.6–10.3)
Chloride: 104 mmol/L (ref 98–110)
Creat: 2.98 mg/dL — ABNORMAL HIGH (ref 0.70–1.25)
GFR, Est African American: 24 mL/min/{1.73_m2} — ABNORMAL LOW (ref >=60)
GFR, Est Non African American: 21 mL/min/{1.73_m2} — ABNORMAL LOW (ref >=60)
Globulin: 2.8 g/dL (calc) (ref 1.9–3.7)
Glucose, Bld: 125 mg/dL — ABNORMAL HIGH (ref 65–99)
Potassium: 4.4 mmol/L (ref 3.5–5.3)
Sodium: 142 mmol/L (ref 135–146)
Total Bilirubin: 0.6 mg/dL (ref 0.2–1.2)
Total Protein: 7.1 g/dL (ref 6.1–8.1)

## 2020-09-22 NOTE — Progress Notes (Signed)
Jeremiah Thomas - 65 y.o. male MRN [id number]  Date of birth: 1955-06-17  Subjective Chief Complaint  Patient presents with  . Dizziness    HPI Jeremiah Thomas is a 65 y.o. male here today with complaint of dizziness.  He has had this off and on for several weeks.  This is worsened by positional changes such as standing too quickly.  He has fallen a couple of times due to his dizziness.  He denies syncopal episodes.  He has history of orthostasis.  He denies chest pain, shortness of breath, palpitations, headache, weakness, ear pain/pressur or congestion.   ROS:  A comprehensive ROS was completed and negative except as noted per HPI  No Known Allergies  Past Medical History:  Diagnosis Date  . Hypertension     History reviewed. No pertinent surgical history.  Social History   Socioeconomic History  . Marital status: Married    Spouse name: Not on file  . Number of children: Not on file  . Years of education: Not on file  . Highest education level: Not on file  Occupational History  . Not on file  Tobacco Use  . Smoking status: Former Research scientist (life sciences)  . Smokeless tobacco: Never Used  Substance and Sexual Activity  . Alcohol use: No  . Drug use: No  . Sexual activity: Not Currently  Other Topics Concern  . Not on file  Social History Narrative  . Not on file   Social Determinants of Health   Financial Resource Strain: Not on file  Food Insecurity: Not on file  Transportation Needs: Not on file  Physical Activity: Not on file  Stress: Not on file  Social Connections: Not on file    Family History  Problem Relation Age of Onset  . Cancer Mother        ovarian  . Hypertension Father   . Diabetes Father   . Stroke Father     Health Maintenance  Topic Date Due  . Hepatitis C Screening  Never done  . COVID-19 Vaccine (1) Never done  . HIV Screening  Never done  . TETANUS/TDAP  Never done  . OPHTHALMOLOGY EXAM  12/21/2019  . COLONOSCOPY (Pts 45-24yrs Insurance coverage will  need to be confirmed)  03/20/2021 (Originally 12/18/1999)  . PNA vac Low Risk Adult (1 of 2 - PCV13) 03/20/2021 (Originally 12/18/2019)  . HEMOGLOBIN A1C  12/19/2020  . INFLUENZA VACCINE  01/13/2021  . FOOT EXAM  03/20/2021  . HPV VACCINES  Aged Out     ----------------------------------------------------------------------------------------------------------------------------------------------------------------------------------------------------------------- Physical Exam Ht 6\' 2"  (1.88 m)   Wt 300 lb (136.1 kg)   SpO2 95%   BMI 38.52 kg/m   Physical Exam Constitutional:      Appearance: Normal appearance.  HENT:     Head: Normocephalic and atraumatic.     Right Ear: Tympanic membrane normal.     Left Ear: Tympanic membrane normal.  Eyes:     General: No scleral icterus. Cardiovascular:     Rate and Rhythm: Normal rate and regular rhythm.  Pulmonary:     Effort: Pulmonary effort is normal.     Breath sounds: Normal breath sounds.  Musculoskeletal:     Cervical back: Neck supple.  Neurological:     General: No focal deficit present.     Mental Status: He is alert.  Psychiatric:        Mood and Affect: Mood normal.        Behavior: Behavior normal.     -------------------------------------------------------------------------------------------------------------------------------------------------------------------------------------------------------------------  Assessment and Plan  Orthostatic hypotension He has orthostasis on exam with BP drop of >60mm/hg from sitting to standing.  No associated tachycardia.   Will have him reduce hydralazine to 1/2 tab bid.   Discussed precautions with positional changes.  Orders Placed This Encounter  Procedures  . COMPLETE METABOLIC PANEL WITH GFR  . CBC with Differential    Return in about 4 weeks (around 10/18/2020) for F/u Dizziness with PCP.    No orders of the defined types were placed in this encounter.   Return in  about 4 weeks (around 10/18/2020) for F/u Dizziness with PCP.    This visit occurred during the SARS-CoV-2 public health emergency.  Safety protocols were in place, including screening questions prior to the visit, additional usage of staff PPE, and extensive cleaning of exam room while observing appropriate contact time as indicated for disinfecting solutions.

## 2020-09-22 NOTE — Assessment & Plan Note (Addendum)
He has orthostasis on exam with BP drop of >92mm/hg from sitting to standing.  No associated tachycardia.   Will have him reduce hydralazine to 1/2 tab bid.   Discussed precautions with positional changes.  Orders Placed This Encounter  Procedures  . COMPLETE METABOLIC PANEL WITH GFR  . CBC with Differential    Return in about 4 weeks (around 10/18/2020) for F/u Dizziness with PCP.

## 2020-09-23 ENCOUNTER — Other Ambulatory Visit: Payer: Self-pay | Admitting: Family Medicine

## 2020-09-23 DIAGNOSIS — N1832 Chronic kidney disease, stage 3b: Secondary | ICD-10-CM

## 2020-10-02 ENCOUNTER — Telehealth: Payer: Self-pay

## 2020-10-02 NOTE — Telephone Encounter (Signed)
-----   Message from Luetta Nutting, DO sent at 09/23/2020  7:43 AM EDT ----- Please let patient know that kidney function has worsened some.  Recommend that he hydrate well and lets recheck in 7-10 days.  BMP orders entered.   Thanks!  CM

## 2020-10-02 NOTE — Telephone Encounter (Signed)
Printed results and recommendations (with lab orders). Mailed to the patient. Made 3 attempts with voicemail messages.

## 2020-10-18 ENCOUNTER — Ambulatory Visit (INDEPENDENT_AMBULATORY_CARE_PROVIDER_SITE_OTHER): Payer: Medicare Other | Admitting: Physician Assistant

## 2020-10-18 ENCOUNTER — Other Ambulatory Visit: Payer: Self-pay

## 2020-10-18 ENCOUNTER — Encounter: Payer: Self-pay | Admitting: Physician Assistant

## 2020-10-18 VITALS — Ht 74.0 in | Wt 307.0 lb

## 2020-10-18 DIAGNOSIS — R600 Localized edema: Secondary | ICD-10-CM | POA: Diagnosis not present

## 2020-10-18 DIAGNOSIS — K219 Gastro-esophageal reflux disease without esophagitis: Secondary | ICD-10-CM

## 2020-10-18 DIAGNOSIS — C61 Malignant neoplasm of prostate: Secondary | ICD-10-CM

## 2020-10-18 DIAGNOSIS — Z8616 Personal history of COVID-19: Secondary | ICD-10-CM

## 2020-10-18 DIAGNOSIS — R4589 Other symptoms and signs involving emotional state: Secondary | ICD-10-CM

## 2020-10-18 DIAGNOSIS — J9611 Chronic respiratory failure with hypoxia: Secondary | ICD-10-CM | POA: Diagnosis not present

## 2020-10-18 DIAGNOSIS — R351 Nocturia: Secondary | ICD-10-CM

## 2020-10-18 DIAGNOSIS — E11620 Type 2 diabetes mellitus with diabetic dermatitis: Secondary | ICD-10-CM

## 2020-10-18 DIAGNOSIS — I951 Orthostatic hypotension: Secondary | ICD-10-CM

## 2020-10-18 DIAGNOSIS — I10 Essential (primary) hypertension: Secondary | ICD-10-CM

## 2020-10-18 LAB — POCT GLYCOSYLATED HEMOGLOBIN (HGB A1C): Hemoglobin A1C: 6.9 % — AB (ref 4.0–5.6)

## 2020-10-18 MED ORDER — DULOXETINE HCL 30 MG PO CPEP
ORAL_CAPSULE | ORAL | 1 refills | Status: DC
Start: 2020-10-18 — End: 2021-01-29

## 2020-10-18 MED ORDER — PANTOPRAZOLE SODIUM 40 MG PO TBEC: DELAYED_RELEASE_TABLET | ORAL | 1 refills | Status: DC

## 2020-10-18 MED ORDER — AMLODIPINE BESYLATE 5 MG PO TABS: 5.0000 mg | ORAL_TABLET | Freq: Every day | ORAL | 3 refills | Status: DC

## 2020-10-18 MED ORDER — TRULICITY 1.5 MG/0.5ML ~~LOC~~ SOAJ: SUBCUTANEOUS | 0 refills | Status: DC

## 2020-10-18 MED ORDER — FINASTERIDE 5 MG PO TABS: ORAL_TABLET | ORAL | 1 refills | Status: DC

## 2020-10-18 MED ORDER — TRELEGY ELLIPTA 100-62.5-25 MCG/INH IN AEPB: 1.0000 | INHALATION_SPRAY | Freq: Every day | RESPIRATORY_TRACT | 5 refills | Status: DC

## 2020-10-18 MED ORDER — FUROSEMIDE 20 MG PO TABS: 20.0000 mg | ORAL_TABLET | Freq: Two times a day (BID) | ORAL | 1 refills | Status: DC

## 2020-10-18 MED ORDER — TAMSULOSIN HCL 0.4 MG PO CAPS
ORAL_CAPSULE | ORAL | 5 refills | Status: DC
Start: 2020-10-18 — End: 2021-01-31

## 2020-10-18 MED ORDER — METOPROLOL TARTRATE 100 MG PO TABS: ORAL_TABLET | ORAL | 1 refills | Status: DC

## 2020-10-18 NOTE — Patient Instructions (Addendum)
Stop hydralazine all together.  Compression stockings.    Orthostatic Hypotension Blood pressure is a measurement of how strongly, or weakly, your blood is pressing against the walls of your arteries. Orthostatic hypotension is a sudden drop in blood pressure that happens when you quickly change positions, such as when you get up from sitting or lying down. Arteries are blood vessels that carry blood from your heart throughout your body. When blood pressure is too low, you may not get enough blood to your brain or to the rest of your organs. This can cause weakness, light-headedness, rapid heartbeat, and fainting. This can last for just a few seconds or for up to a few minutes. Orthostatic hypotension is usually not a serious problem. However, if it happens frequently or gets worse, it may be a sign of something more serious. What are the causes? This condition may be caused by:  Sudden changes in posture, such as standing up quickly after you have been sitting or lying down.  Blood loss.  Loss of body fluids (dehydration).  Heart problems.  Hormone (endocrine) problems.  Pregnancy.  Severe infection.  Lack of certain nutrients.  Severe allergic reactions (anaphylaxis).  Certain medicines, such as blood pressure medicine or medicines that make the body lose excess fluids (diuretics). Sometimes, this condition can be caused by not taking medicine as directed, such as taking too much of a certain medicine. What increases the risk? The following factors may make you more likely to develop this condition:  Age. Risk increases as you get older.  Conditions that affect the heart or the central nervous system.  Taking certain medicines, such as blood pressure medicine or diuretics.  Being pregnant. What are the signs or symptoms? Symptoms of this condition may include:  Weakness.  Light-headedness.  Dizziness.  Blurred vision.  Fatigue.  Rapid heartbeat.  Fainting, in  severe cases. How is this diagnosed? This condition is diagnosed based on:  Your medical history.  Your symptoms.  Your blood pressure measurement. Your health care provider will check your blood pressure when you are: ? Lying down. ? Sitting. ? Standing. A blood pressure reading is recorded as two numbers, such as "120 over 80" (or 120/80). The first ("top") number is called the systolic pressure. It is a measure of the pressure in your arteries as your heart beats. The second ("bottom") number is called the diastolic pressure. It is a measure of the pressure in your arteries when your heart relaxes between beats. Blood pressure is measured in a unit called mm Hg. Healthy blood pressure for most adults is 120/80. If your blood pressure is below 90/60, you may be diagnosed with hypotension. Other information or tests that may be used to diagnose orthostatic hypotension include:  Your other vital signs, such as your heart rate and temperature.  Blood tests.  Tilt table test. For this test, you will be safely secured to a table that moves you from a lying position to an upright position. Your heart rhythm and blood pressure will be monitored during the test. How is this treated? This condition may be treated by:  Changing your diet. This may involve eating more salt (sodium) or drinking more water.  Taking medicines to raise your blood pressure.  Changing the dosage of certain medicines you are taking that might be lowering your blood pressure.  Wearing compression stockings. These stockings help to prevent blood clots and reduce swelling in your legs. In some cases, you may need to go to  the hospital for:  Fluid replacement. This means you will receive fluids through an IV.  Blood replacement. This means you will receive donated blood through an IV (transfusion).  Treating an infection or heart problems, if this applies.  Monitoring. You may need to be monitored while medicines  that you are taking wear off. Follow these instructions at home: Eating and drinking  Drink enough fluid to keep your urine pale yellow.  Eat a healthy diet, and follow instructions from your health care provider about eating or drinking restrictions. A healthy diet includes: ? Fresh fruits and vegetables. ? Whole grains. ? Lean meats. ? Low-fat dairy products.  Eat extra salt only as directed. Do not add extra salt to your diet unless your health care provider told you to do that.  Eat frequent, small meals.  Avoid standing up suddenly after eating.   Medicines  Take over-the-counter and prescription medicines only as told by your health care provider. ? Follow instructions from your health care provider about changing the dosage of your current medicines, if this applies. ? Do not stop or adjust any of your medicines on your own. General instructions  Wear compression stockings as told by your health care provider.  Get up slowly from lying down or sitting positions. This gives your blood pressure a chance to adjust.  Avoid hot showers and excessive heat as directed by your health care provider.  Return to your normal activities as told by your health care provider. Ask your health care provider what activities are safe for you.  Do not use any products that contain nicotine or tobacco, such as cigarettes, e-cigarettes, and chewing tobacco. If you need help quitting, ask your health care provider.  Keep all follow-up visits as told by your health care provider. This is important.   Contact a health care provider if you:  Vomit.  Have diarrhea.  Have a fever for more than 2-3 days.  Feel more thirsty than usual.  Feel weak and tired. Get help right away if you:  Have chest pain.  Have a fast or irregular heartbeat.  Develop numbness in any part of your body.  Cannot move your arms or your legs.  Have trouble speaking.  Become sweaty or feel  light-headed.  Faint.  Feel short of breath.  Have trouble staying awake.  Feel confused. Summary  Orthostatic hypotension is a sudden drop in blood pressure that happens when you quickly change positions.  Orthostatic hypotension is usually not a serious problem.  It is diagnosed by having your blood pressure taken lying down, sitting, and then standing.  It may be treated by changing your diet or adjusting your medicines. This information is not intended to replace advice given to you by your health care provider. Make sure you discuss any questions you have with your health care provider. Document Revised: 11/25/2017 Document Reviewed: 11/25/2017 Elsevier Patient Education  [place].

## 2020-10-18 NOTE — Progress Notes (Signed)
Subjective:    Patient ID: Jeremiah Thomas, male    DOB: Apr 25, 1956, 65 y.o.   MRN: [id number]  HPI  Pt is a 65 yo male with recent dx of orthostatic hypotension, T2DM, HTN, HLD, CAD, PE who presents to the clinic for follow up.   On 4/8 he saw my partner, Dr. Zigmund Thomas for dizziness. His orthostatic BP showed a greater than 20 point drop on systolic end his hydralazine was cut in half and told to follow up. He feels a little better but still having dizziness. He fell 3 times this week. No vision changes, memory loss, speech changes, strength changes. Not checking BP at home. No increase in SOB. He still gets winded from time to time.   He continues to stay on same medications. Denies any hypoglycemia. No open sores or wounds.   .. Active Ambulatory Problems    Diagnosis Date Noted  . History of stroke 05/20/2016  . Asymptomatic hypertensive urgency 05/20/2016  . Acute kidney injury ([place]) 06/02/2016  . Essential hypertension, benign 06/02/2016  . CKD (chronic kidney disease) stage 3, GFR 30-59 ml/min (HCC) 06/03/2016  . Frequent urination at night 05/13/2017  . Elevated PSA 07/20/2017  . Elevated fasting glucose 07/20/2017  . Nasal congestion 08/15/2017  . Morbidly obese ([place]) 09/08/2017  . Type II diabetes mellitus, uncontrolled ([place]) 09/13/2017  . Prostate cancer ([place]) 10/22/2017  . Bilateral leg edema 02/21/2018  . Craving for particular food 02/21/2018  . Newly recognized heart murmur 02/21/2018  . 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  . Non-restorative sleep 06/13/2018  . Snoring 06/13/2018  . No energy 06/13/2018  . OSA (obstructive sleep apnea) 08/22/2018  . Paresthesia 09/12/2018  . Lightheaded 09/12/2018  . Orthostatic hypotension 12/07/2018  . Pneumonia due to COVID-19 virus 08/25/2019  . Chronic respiratory failure with hypoxia ([place]) 08/25/2019  . Elevated lipase 08/25/2019  . Low TSH level 08/25/2019  . Bilateral pulmonary embolism ([place]) 08/25/2019  . Lower GI bleed  08/25/2019  . Malnutrition of mild degree ([place]) 08/25/2019  . Serum albumin decreased 08/25/2019  . Anemia 08/25/2019  . History of COVID-19 09/20/2019  . 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 Depressed mood 09/20/2019  . Former smoker 12/19/2019  . Benign prostatic hyperplasia without lower urinary tract symptoms 12/19/2019  . 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 No Resolved Ambulatory Problems   Past Medical History:  Diagnosis Date  . Hypertension      Review of Systems    see HPI>  Objective:   Physical Exam Vitals reviewed.  Constitutional:      Appearance: Normal appearance. He is obese.  HENT:     Head: Normocephalic.  Cardiovascular:     Rate and Rhythm: Normal rate and regular rhythm.     Pulses: Normal pulses.     Heart sounds: Normal heart sounds.  Pulmonary:     Effort: Pulmonary effort is normal.     Breath sounds: Normal breath sounds.  Neurological:     Mental Status: He is alert and oriented to person, place, and time.     Comments: Dizziness with position changes.   Psychiatric:        Mood and Affect: Mood normal.           Assessment & Plan:  .Marland KitchenJohn was seen today for dizziness and diabetes.  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  Chronic respiratory failure with hypoxia (HCC) -     Fluticasone-Umeclidin-Vilant (TRELEGY ELLIPTA) 100-62.5-25 MCG/INH  AEPB; Inhale 1 puff into the lungs daily.  History of COVID-19 -     Fluticasone-Umeclidin-Vilant (TRELEGY ELLIPTA) 100-62.5-25 MCG/INH AEPB; Inhale 1 puff into the lungs daily.  Depressed mood -     DULoxetine (CYMBALTA) 30 MG capsule; TAKE 1 CAPSULE(30 MG) BY MOUTH DAILY  Essential hypertension, benign -     amLODipine (NORVASC) 5 MG tablet; Take 1 tablet (5 mg total) by mouth daily. -     metoprolol tartrate (LOPRESSOR) 100 MG tablet; TAKE 1 TABLET BY MOUTH TWICE DAILY  Frequent urination at night -     tamsulosin (FLOMAX) 0.4 MG CAPS capsule; TAKE 2 CAPSULES(0.8 MG) BY MOUTH DAILY -     finasteride (PROSCAR) 5 MG tablet; TAKE 1 TABLET(5 MG) BY MOUTH DAILY  Prostate cancer (HCC) -     tamsulosin (FLOMAX) 0.4 MG CAPS capsule; TAKE 2 CAPSULES(0.8 MG) BY MOUTH DAILY -     finasteride (PROSCAR) 5 MG tablet; TAKE 1 TABLET(5 MG) BY MOUTH DAILY  Gastroesophageal reflux disease, unspecified whether esophagitis present -     pantoprazole (PROTONIX) 40 MG tablet; TAKE 1 TABLET(40 MG) BY MOUTH TWICE DAILY   Still having significant orthostatic hypotension. Stop hydralazine. Use cane and walker when getting up. Wear compression stockings. Ok to increase salt a little too. Check BP in 1 week.   a1C is up from last check.  Continue same medications.  On ARB. On STATIN.  Needs eye exam. Referral made.  Foot exam UTD.  Covid/flu/pneumonia/shingles declined.   1 week follow up and 3 month month for DM.

## 2020-10-25 ENCOUNTER — Other Ambulatory Visit: Payer: Self-pay

## 2020-10-25 ENCOUNTER — Ambulatory Visit (INDEPENDENT_AMBULATORY_CARE_PROVIDER_SITE_OTHER): Payer: Medicare Other | Admitting: Physician Assistant

## 2020-10-25 VITALS — Wt 306.0 lb

## 2020-10-25 DIAGNOSIS — I10 Essential (primary) hypertension: Secondary | ICD-10-CM

## 2020-10-25 DIAGNOSIS — I951 Orthostatic hypotension: Secondary | ICD-10-CM | POA: Diagnosis not present

## 2020-10-25 NOTE — Progress Notes (Signed)
Established Patient Office Visit  Subjective:  Patient ID: Jeremiah Thomas, male    DOB: Oct 29, 1955  Age: 65 y.o. MRN: [id number]  CC:  Chief Complaint  Patient presents with  . Orthostatic Hypotension    HPI Jeremiah Thomas presents for an orthostatic BP check. Lying 145/90 HR78, sitting 131/87 HR86, standing 90/65 HR91, standing 103/67 HR97.  Past Medical History:  Diagnosis Date  . Hypertension     History reviewed. No pertinent surgical history.  Family History  Problem Relation Age of Onset  . Cancer Mother        ovarian  . Hypertension Father   . Diabetes Father   . Stroke Father     Social History   Socioeconomic History  . Marital status: Married    Spouse name: Not on file  . Number of children: Not on file  . Years of education: Not on file  . Highest education level: Not on file  Occupational History  . Not on file  Tobacco Use  . Smoking status: Former Research scientist (life sciences)  . Smokeless tobacco: Never Used  Substance and Sexual Activity  . Alcohol use: No  . Drug use: No  . Sexual activity: Not Currently  Other Topics Concern  . Not on file  Social History Narrative  . Not on file   Social Determinants of Health   Financial Resource Strain: Not on file  Food Insecurity: Not on file  Transportation Needs: Not on file  Physical Activity: Not on file  Stress: Not on file  Social Connections: Not on file  Intimate Partner Violence: Not on file    Outpatient Medications Prior to Visit  Medication Sig Dispense Refill  . albuterol (VENTOLIN HFA) 108 (90 Base) MCG/ACT inhaler INHALE 2 PUFFS INTO THE LUNGS EVERY 6 HOURS AS NEEDED FOR WHEEZING OR SHORTNESS OF BREATH 18 g 2  . AMBULATORY NON FORMULARY MEDICATION Continuous positive airway pressure (CPAP) machine set at autopap from 5-20cmH20, with all supplemental supplies as needed. 1 each 0  . amLODipine (NORVASC) 5 MG tablet Take 1 tablet (5 mg total) by mouth daily. 90 tablet 3  . aspirin EC 81 MG tablet Take 81 mg  by mouth daily.    Marland Kitchen atorvastatin (LIPITOR) 40 MG tablet Take 1 tablet (40 mg total) by mouth daily. 90 tablet 3  . Blood Glucose Monitoring Suppl (MM EASY TOUCH GLUCOSE METER) w/Device KIT 1 Stick by Does not apply route daily. Check fasting blood sugar daily. Dx - DM ICD-10- E11.8 1 kit 0  . Dulaglutide (TRULICITY) 1.5 FV/4.9SW SOPN ADMINISTER 1.5 MG UNDER THE SKIN 1 TIME A WEEK 6 mL 0  . DULoxetine (CYMBALTA) 30 MG capsule TAKE 1 CAPSULE(30 MG) BY MOUTH DAILY 90 capsule 1  . finasteride (PROSCAR) 5 MG tablet TAKE 1 TABLET(5 MG) BY MOUTH DAILY 90 tablet 1  . fluticasone (FLONASE) 50 MCG/ACT nasal spray USE 2 SPRAYS IN EACH NOSTRIL DAILY 16 g 0  . Fluticasone-Umeclidin-Vilant (TRELEGY ELLIPTA) 100-62.5-25 MCG/INH AEPB Inhale 1 puff into the lungs daily. 60 each 5  . furosemide (LASIX) 20 MG tablet Take 1 tablet (20 mg total) by mouth 2 (two) times daily. 180 tablet 1  . glucose blood test strip Check fasting blood sugar daily. Dx - DM ICD-10- E11.8 100 each 12  . Lancets MISC Check fasting blood sugar daily. Dx - DM ICD-10- E11.8 100 each prn  . metoprolol tartrate (LOPRESSOR) 100 MG tablet TAKE 1 TABLET BY MOUTH TWICE DAILY 180 tablet 1  .  pantoprazole (PROTONIX) 40 MG tablet TAKE 1 TABLET(40 MG) BY MOUTH TWICE DAILY 180 tablet 1  . tamsulosin (FLOMAX) 0.4 MG CAPS capsule TAKE 2 CAPSULES(0.8 MG) BY MOUTH DAILY 60 capsule 5   No facility-administered medications prior to visit.    No Known Allergies  ROS Review of Systems    Objective:    Physical Exam  Wt (!) 306 lb (138.8 kg)   SpO2 99%   BMI 39.29 kg/m  Wt Readings from Last 3 Encounters:  10/25/20 (!) 306 lb (138.8 kg)  10/18/20 (!) 307 lb (139.3 kg)  09/20/20 300 lb (136.1 kg)     Health Maintenance Due  Topic Date Due  . URINE MICROALBUMIN  Never done  . HIV Screening  Never done  . Hepatitis C Screening  Never done  . OPHTHALMOLOGY EXAM  12/21/2019    There are no preventive care reminders to display for this  patient.  Lab Results  Component Value Date   TSH 1.37 08/23/2019   Lab Results  Component Value Date   WBC 9.4 09/20/2020   HGB 14.2 09/20/2020   HCT 44.1 09/20/2020   MCV 89.8 09/20/2020   PLT 263 09/20/2020   Lab Results  Component Value Date   NA 142 09/20/2020   K 4.4 09/20/2020   CO2 29 09/20/2020   GLUCOSE 125 (H) 09/20/2020   BUN 25 09/20/2020   CREATININE 2.98 (H) 09/20/2020   BILITOT 0.6 09/20/2020   ALKPHOS 63 08/17/2019   AST 17 09/20/2020   ALT 26 09/20/2020   PROT 7.1 09/20/2020   ALBUMIN 3.4 (A) 08/17/2019   CALCIUM 9.7 09/20/2020   Lab Results  Component Value Date   CHOL 105 12/19/2019   Lab Results  Component Value Date   HDL 44 12/19/2019   Lab Results  Component Value Date   LDLCALC 41 12/19/2019   Lab Results  Component Value Date   TRIG 112 12/19/2019   Lab Results  Component Value Date   CHOLHDL 2.4 12/19/2019   Lab Results  Component Value Date   HGBA1C 6.9 (A) 10/18/2020      Assessment & Plan:   Problem List Items Addressed This Visit      Cardiovascular and Mediastinum   Orthostatic hypotension - Primary      No orders of the defined types were placed in this encounter.   Follow-up: No follow-ups on file.    Ninfa Meeker, CMA

## 2020-10-25 NOTE — Progress Notes (Signed)
Patient ID: Jeremiah Thomas, male   DOB: 22-Feb-1956, 65 y.o.   MRN: [id number] Ongoing orthostatic hypotension systolic was 45HQUI today despite decreasing hydralazine and wearing compression stockings. Needs cardiology evaluation. Made referral today.

## 2020-10-28 ENCOUNTER — Other Ambulatory Visit: Payer: Self-pay | Admitting: Physician Assistant

## 2020-10-28 DIAGNOSIS — E11620 Type 2 diabetes mellitus with diabetic dermatitis: Secondary | ICD-10-CM

## 2020-10-29 ENCOUNTER — Other Ambulatory Visit: Payer: Self-pay | Admitting: Physician Assistant

## 2020-10-29 DIAGNOSIS — E11620 Type 2 diabetes mellitus with diabetic dermatitis: Secondary | ICD-10-CM

## 2020-11-18 DIAGNOSIS — I10 Essential (primary) hypertension: Secondary | ICD-10-CM | POA: Insufficient documentation

## 2020-12-02 ENCOUNTER — Encounter: Payer: Self-pay | Admitting: Cardiology

## 2020-12-02 ENCOUNTER — Other Ambulatory Visit: Payer: Self-pay

## 2020-12-02 ENCOUNTER — Ambulatory Visit (INDEPENDENT_AMBULATORY_CARE_PROVIDER_SITE_OTHER): Payer: Medicare Other

## 2020-12-02 ENCOUNTER — Ambulatory Visit (INDEPENDENT_AMBULATORY_CARE_PROVIDER_SITE_OTHER): Payer: Medicare Other | Admitting: Cardiology

## 2020-12-02 VITALS — BP 140/100 | HR 108 | Ht 74.0 in | Wt 305.0 lb

## 2020-12-02 DIAGNOSIS — N1832 Chronic kidney disease, stage 3b: Secondary | ICD-10-CM | POA: Diagnosis not present

## 2020-12-02 DIAGNOSIS — R42 Dizziness and giddiness: Secondary | ICD-10-CM

## 2020-12-02 DIAGNOSIS — I10 Essential (primary) hypertension: Secondary | ICD-10-CM | POA: Diagnosis not present

## 2020-12-02 DIAGNOSIS — I509 Heart failure, unspecified: Secondary | ICD-10-CM | POA: Insufficient documentation

## 2020-12-02 DIAGNOSIS — I951 Orthostatic hypotension: Secondary | ICD-10-CM | POA: Diagnosis not present

## 2020-12-02 DIAGNOSIS — E1165 Type 2 diabetes mellitus with hyperglycemia: Secondary | ICD-10-CM

## 2020-12-02 DIAGNOSIS — I5032 Chronic diastolic (congestive) heart failure: Secondary | ICD-10-CM

## 2020-12-02 DIAGNOSIS — R0683 Snoring: Secondary | ICD-10-CM

## 2020-12-02 DIAGNOSIS — R600 Localized edema: Secondary | ICD-10-CM

## 2020-12-02 NOTE — Progress Notes (Addendum)
 Cardiology Consultation:    Date:  12/02/2020   ID:  Jeremiah Thomas, DOB 11/25/1955, MRN [id number]  PCP:  Breeback, Jade L, PA-C  Cardiologist:  Robert Krasowski, MD   Referring MD: Breeback, Jade L, PA-C   Chief Complaint  Patient presents with   orthostatic hypotension    History of Present Illness:    Jeremiah Thomas is a 65 y.o. male who is being seen today for the evaluation of orthostatic hypotension at the request of Breeback, Jade L, PA-C.  He is a gentleman with complex past medical history.  He does have chronic kidney failure with creatinine neighborhood 2.4, essential hypertension for many years, obstructive sleep apnea, prostate problem requiring medications for urinary retention, dyslipidemia.  He was referred to us because of dizziness upon getting up.  He does have significant orthostatic hypotension.  Few times he fell down but never completely passed out.  He complained of being dizzy.  It happens when he gets up but sometimes when he walks some distance then he will feel it.  It never happened when he sitting it never happened when he lays down.  He used to have some chest pain however she does not have any chest pain anymore.  He remembers having some chest pain more than 5 years ago.  He admits that he is fairly sedentary.  He sits usually and does not do much he like to watch TV do so.  He did have echocardiogram done at the end of last year which showed preserved left ventricle ejection fraction, quite significant left ventricle hypertrophy with restrictive transmitral flow pattern.  He does have some chronic swelling of lower extremities. He quit smoking 20 years ago. He does have family history of coronary artery disease but not premature. He does not exercise on the regular basis is not on any diet.  Past Medical History:  Diagnosis Date   Acute kidney injury (HCC) 06/02/2016   Anemia 08/25/2019   Asymptomatic hypertensive urgency 05/20/2016   Benign prostatic hyperplasia  without lower urinary tract symptoms 12/19/2019   Bilateral leg edema 02/21/2018   Bilateral pulmonary embolism (HCC) 08/25/2019   Chronic respiratory failure with hypoxia (HCC) 08/25/2019   CKD (chronic kidney disease) stage 3, GFR 30-59 ml/min (HCC) 06/03/2016   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, without long-term current use of insulin (HCC) 09/20/2019   Craving for particular food 02/21/2018   Depressed mood 09/20/2019   Elevated fasting glucose 07/20/2017   Elevated lipase 08/25/2019   Elevated PSA 07/20/2017   Essential hypertension, benign 06/02/2016   Normal left ventricular size and systolic function with no appreciable segmental abnormality. Ejection fraction is visually estimated at 60-65% LV diastolic function is mildly abnormal, but there is no evidence of elevated LVEDP or diastolic heart failure. Mild concentric left ventricular hypertrophy. No significant valvular stenosis or regurgitation. 05/2016   Former smoker 12/19/2019   Frequent urination at night 05/13/2017   History of COVID-19 09/20/2019   History of stroke 05/20/2016   Carotid doppler 05/2016 Mild atherosclerosis less than 39 percent.    Hypertension    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   Lightheaded 09/12/2018   Low TSH level 08/25/2019   Lower GI bleed 08/25/2019   Malnutrition of mild degree (HCC) 08/25/2019   Morbidly obese (HCC) 09/08/2017   Nasal congestion 08/15/2017   Newly recognized heart murmur 02/21/2018   No energy 06/13/2018   Non-restorative sleep 06/13/2018   Orthostatic hypotension 12/07/2018   OSA (obstructive sleep apnea) 08/22/2018     Sleep study 08/2018.   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    Serum albumin decreased 08/25/2019   Snoring 06/13/2018   Type II diabetes mellitus, uncontrolled ([place]) 09/13/2017     History reviewed. No pertinent surgical history.  Current Medications: Current Meds  Medication Sig   albuterol (VENTOLIN HFA) 108 (90 Base) MCG/ACT inhaler INHALE 2 PUFFS INTO THE LUNGS EVERY 6 HOURS AS NEEDED FOR WHEEZING OR SHORTNESS OF BREATH   AMBULATORY NON FORMULARY MEDICATION Continuous positive airway pressure (CPAP) machine set at autopap from 5-20cmH20, with all supplemental supplies as needed.   amLODipine (NORVASC) 5 MG tablet Take 1 tablet (5 mg total) by mouth daily.   aspirin EC 81 MG tablet Take 81 mg by mouth daily.   atorvastatin (LIPITOR) 40 MG tablet Take 1 tablet (40 mg total) by mouth daily.   Blood Glucose Monitoring Suppl (MM EASY TOUCH GLUCOSE METER) w/Device KIT 1 Stick by Does not apply route daily. Check fasting blood sugar daily. Dx - DM ICD-10- E11.8   Dulaglutide (TRULICITY) 1.5 PJ/8.2NK SOPN ADMINISTER 1.5 MG UNDER THE SKIN 1 TIME A WEEK   DULoxetine (CYMBALTA) 30 MG capsule TAKE 1 CAPSULE(30 MG) BY MOUTH DAILY   finasteride (PROSCAR) 5 MG tablet TAKE 1 TABLET(5 MG) BY MOUTH DAILY   fluticasone (FLONASE) 50 MCG/ACT nasal spray USE 2 SPRAYS IN EACH NOSTRIL DAILY   Fluticasone-Umeclidin-Vilant (TRELEGY ELLIPTA) 100-62.5-25 MCG/INH AEPB Inhale 1 puff into the lungs daily.   furosemide (LASIX) 20 MG tablet Take 1 tablet (20 mg total) by mouth 2 (two) times daily.   glucose blood test strip Check fasting blood sugar daily. Dx - DM ICD-10- E11.8   Lancets MISC Check fasting blood sugar daily. Dx - DM ICD-10- E11.8   metoprolol tartrate (LOPRESSOR) 100 MG tablet TAKE 1 TABLET BY MOUTH TWICE DAILY   pantoprazole (PROTONIX) 40 MG tablet TAKE 1 TABLET(40 MG) BY MOUTH TWICE DAILY   tamsulosin (FLOMAX) 0.4 MG CAPS capsule TAKE 2 CAPSULES(0.8 MG) BY MOUTH DAILY     Allergies:   Patient has no known allergies.   Social History   Socioeconomic History   Marital status: Married    Spouse name: Not on file   Number of children: Not on file   Years of education:  Not on file   Highest education level: Not on file  Occupational History   Not on file  Tobacco Use   Smoking status: Former    Pack years: 0.00   Smokeless tobacco: Never  Substance and Sexual Activity   Alcohol use: No   Drug use: No   Sexual activity: Not Currently  Other Topics Concern   Not on file  Social History Narrative   Not on file   Social Determinants of Health   Financial Resource Strain: Not on file  Food Insecurity: Not on file  Transportation Needs: Not on file  Physical Activity: Not on file  Stress: Not on file  Social Connections: Not on file     Family History: The patient's family history includes Cancer in his mother; Diabetes in his father; Hypertension in his father; Stroke in his father. ROS:   Please see the history of present illness.    All 14 point review of systems negative except as described per history of present illness.  EKGs/Labs/Other Studies Reviewed:  The following studies were reviewed today: Echocardiogram done in March 12, 2020 showed: IMPRESSIONS     1. Left ventricular ejection fraction, by estimation, is 60 to 65%. The  left ventricle has normal function. The left ventricle has no regional  wall motion abnormalities. There is moderate left ventricular hypertrophy.  Left ventricular diastolic  parameters are consistent with Grade III diastolic dysfunction  (restrictive).   2. Right ventricular systolic function is normal. The right ventricular  size is normal.   3. Left atrial size was mildly dilated.   4. The mitral valve is normal in structure. No evidence of mitral valve  regurgitation. No evidence of mitral stenosis.   5. The aortic valve is normal in structure. Aortic valve regurgitation is  not visualized. No aortic stenosis is present.   6. The inferior vena cava is normal in size with greater than 50%  respiratory variability, suggesting right atrial pressure of 3 mmHg.   EKG:  EKG is  ordered today.   The ekg ordered today demonstrates sinus tachycardia, normal P interval, left ventricle hypertrophy, T wave changes secondary to LVH.  Recent Labs: 09/20/2020: ALT 26; BUN 25; Creat 2.98; Hemoglobin 14.2; Platelets 263; Potassium 4.4; Sodium 142  Recent Lipid Panel    Component Value Date/Time   CHOL 105 12/19/2019 0922   TRIG 112 12/19/2019 0922   HDL 44 12/19/2019 0922   CHOLHDL 2.4 12/19/2019 0922   LDLCALC 41 12/19/2019 0922    Physical Exam:    VS:  BP (!) 140/100 (BP Location: Right Arm, Patient Position: Sitting, Cuff Size: Large)   Pulse (!) 108   Ht 6' 2" (1.88 m)   Wt (!) 305 lb (138.3 kg)   SpO2 97%   BMI 39.16 kg/m     Wt Readings from Last 3 Encounters:  12/02/20 (!) 305 lb (138.3 kg)  10/25/20 (!) 306 lb (138.8 kg)  10/18/20 (!) 307 lb (139.3 kg)     GEN:  Well nourished, well developed in no acute distress HEENT: Normal NECK: No JVD; No carotid bruits LYMPHATICS: No lymphadenopathy CARDIAC: RRR, no murmurs, no rubs, no gallops RESPIRATORY:  Clear to auscultation without rales, wheezing or rhonchi  ABDOMEN: Soft, non-tender, non-distended MUSCULOSKELETAL:  No edema; No deformity  SKIN: Warm and dry NEUROLOGIC:  Alert and oriented x 3 PSYCHIATRIC:  Normal affect   ASSESSMENT:    1. Orthostatic hypotension   2. Essential hypertension, benign   3. Uncontrolled type 2 diabetes mellitus with hyperglycemia (HCC)   4. Stage 3b chronic kidney disease (HCC)   5. Snoring   6. Bilateral leg edema   7. Chronic diastolic congestive heart failure (HCC)    PLAN:    In order of problems listed above:  Orthostatic hypotension.  I think it is a multifactorial problem.  He takes amlodipine, he also take Lasix as well as Apresoline.  On top of that he is taking Flomax.  We did talk in length about what to do with the situation.  He takes Flomax twice daily 0.4 I advised him to skip morning dose only take it during the day.  Also advised him to get up very slowly and  wait before walking, on top of that we did talk about elastic stockings as well as some abdominal belt that can help significant orthostatic hypotension.  We did talk also about need to stay well-hydrated especially in the hot weather.  I told him that this is a fairly difficult situation from 1 and to   try to control his blood pressure the best we can, controlling swelling of lower extremities at the same time we struggling with his orthostatic hypotension.  I hope reduction of alpha-blocker-Flomax will help.  Also in the future we may go on more frequent hydralazine doses during like 3 times daily rather than twice a day.  That should also help to stabilize his blood pressure and avoid significant orthostatic hypotension during the time of peak of medication. Congestive heart for which is diastolic in nature of his echocardiogram September showed restrictive pattern.  He does have some swelling of lower extremities as well as some shortness of breath but overall other than that appears to be compensated.  He is taking small dose of diuretics which that will not alter.  He does have over the kidney dysfunction. Essential hypertension this is first visit to my office blood pressure elevated however I will not intensify therapy because of significant orthostatic hypotension to the point that he have some episode of syncope.  I am ready to accept slightly high blood pressure in order to prevent him from falling down or passing out. Snoring: Obstructive sleep apnea he said he use mask very rarely I encouraged him to do it on the regular basis. Kidney dysfunction.  Noted.  We will avoid any nephrotoxic medications. I will also ask him to wear Zio patch to make sure we not dealing with any significant arrhythmia  Medication Adjustments/Labs and Tests Ordered: Current medicines are reviewed at length with the patient today.  Concerns regarding medicines are outlined above.  No orders of the defined types were  placed in this encounter.  No orders of the defined types were placed in this encounter.   Signed, Park Liter, MD, [facility]. 12/02/2020 9:17 AM    [place]

## 2020-12-02 NOTE — Patient Instructions (Signed)
Medication Instructions:  Your physician has recommended you make the following change in your medication:  STOP: Morning flomax dose   *If you need a refill on your cardiac medications before your next appointment, please call your pharmacy*   Lab Work: None If you have labs (blood work) drawn today and your tests are completely normal, you will receive your results only by: [place] (if you have MyChart) OR A paper copy in the mail If you have any lab test that is abnormal or we need to change your treatment, we will call you to review the results.   Testing/Procedures: A zio monitor was ordered today. It will remain on for 7 days. You will then return monitor and event diary in provided box. It takes 1-2 weeks for report to be downloaded and returned to [place]. We will call you with the results. If monitor falls off or has orange flashing light, please call Zio for further instructions.     Follow-Up: At [facility], you and your health needs are our priority.  As part of our continuing mission to provide you with exceptional heart care, we have created designated Provider Care Teams.  These Care Teams include your primary Cardiologist (physician) and Advanced Practice Providers (APPs -  Physician Assistants and Nurse Practitioners) who all work together to provide you with the care you need, when you need it.  We recommend signing up for the patient portal called "MyChart".  Sign up information is provided on this After Visit Summary.  MyChart is used to connect with patients for Virtual Visits (Telemedicine).  Patients are able to view lab/test results, encounter notes, upcoming appointments, etc.  Non-urgent messages can be sent to your provider as well.   To learn more about what you can do with MyChart, go to [email or web address].    Your next appointment:   3 month(s)  The format for your next appointment:   In Person  Provider:   [name], MD   Other  Instructions

## 2020-12-02 NOTE — Addendum Note (Signed)
Addended by: Senaida Ores on: 12/02/2020 09:41 AM   Modules accepted: Orders

## 2020-12-09 DIAGNOSIS — R42 Dizziness and giddiness: Secondary | ICD-10-CM

## 2020-12-22 ENCOUNTER — Other Ambulatory Visit: Payer: Self-pay | Admitting: Physician Assistant

## 2020-12-22 DIAGNOSIS — E11620 Type 2 diabetes mellitus with diabetic dermatitis: Secondary | ICD-10-CM

## 2021-01-29 ENCOUNTER — Ambulatory Visit (INDEPENDENT_AMBULATORY_CARE_PROVIDER_SITE_OTHER): Payer: Medicare Other | Admitting: Physician Assistant

## 2021-01-29 VITALS — BP 221/122 | HR 98 | Ht 74.0 in | Wt 315.0 lb

## 2021-01-29 DIAGNOSIS — I4510 Unspecified right bundle-branch block: Secondary | ICD-10-CM

## 2021-01-29 DIAGNOSIS — I10 Essential (primary) hypertension: Secondary | ICD-10-CM | POA: Diagnosis not present

## 2021-01-29 DIAGNOSIS — N184 Chronic kidney disease, stage 4 (severe): Secondary | ICD-10-CM

## 2021-01-29 DIAGNOSIS — I160 Hypertensive urgency: Secondary | ICD-10-CM

## 2021-01-29 DIAGNOSIS — E1165 Type 2 diabetes mellitus with hyperglycemia: Secondary | ICD-10-CM

## 2021-01-29 DIAGNOSIS — E11620 Type 2 diabetes mellitus with diabetic dermatitis: Secondary | ICD-10-CM

## 2021-01-29 DIAGNOSIS — R351 Nocturia: Secondary | ICD-10-CM

## 2021-01-29 DIAGNOSIS — R4589 Other symptoms and signs involving emotional state: Secondary | ICD-10-CM

## 2021-01-29 DIAGNOSIS — G4733 Obstructive sleep apnea (adult) (pediatric): Secondary | ICD-10-CM

## 2021-01-29 DIAGNOSIS — Z8673 Personal history of transient ischemic attack (TIA), and cerebral infarction without residual deficits: Secondary | ICD-10-CM

## 2021-01-29 DIAGNOSIS — I5032 Chronic diastolic (congestive) heart failure: Secondary | ICD-10-CM

## 2021-01-29 DIAGNOSIS — C61 Malignant neoplasm of prostate: Secondary | ICD-10-CM

## 2021-01-29 DIAGNOSIS — K219 Gastro-esophageal reflux disease without esophagitis: Secondary | ICD-10-CM

## 2021-01-29 DIAGNOSIS — I440 Atrioventricular block, first degree: Secondary | ICD-10-CM

## 2021-01-29 DIAGNOSIS — R600 Localized edema: Secondary | ICD-10-CM

## 2021-01-29 LAB — POCT GLYCOSYLATED HEMOGLOBIN (HGB A1C): Hemoglobin A1C: 9 % — AB (ref 4.0–5.6)

## 2021-01-29 MED ORDER — PANTOPRAZOLE SODIUM 40 MG PO TBEC: DELAYED_RELEASE_TABLET | ORAL | 1 refills | Status: DC

## 2021-01-29 MED ORDER — GLIPIZIDE 10 MG PO TABS: 10.0000 mg | ORAL_TABLET | Freq: Two times a day (BID) | ORAL | 1 refills | Status: DC

## 2021-01-29 MED ORDER — FINASTERIDE 5 MG PO TABS: ORAL_TABLET | ORAL | 1 refills | Status: DC

## 2021-01-29 MED ORDER — METOPROLOL TARTRATE 100 MG PO TABS: ORAL_TABLET | ORAL | 1 refills | Status: DC

## 2021-01-29 MED ORDER — DULOXETINE HCL 30 MG PO CPEP: ORAL_CAPSULE | ORAL | 1 refills | Status: DC

## 2021-01-29 MED ORDER — AMLODIPINE BESYLATE 5 MG PO TABS: 5.0000 mg | ORAL_TABLET | Freq: Every day | ORAL | 1 refills | Status: DC

## 2021-01-29 MED ORDER — FUROSEMIDE 20 MG PO TABS: 20.0000 mg | ORAL_TABLET | Freq: Two times a day (BID) | ORAL | 1 refills | Status: DC

## 2021-01-29 MED ORDER — CLONIDINE HCL 0.1 MG PO TABS
0.1000 mg | ORAL_TABLET | Freq: Once | ORAL | Status: AC
  Administered 2021-01-29: 0.1 mg via ORAL

## 2021-01-29 MED ORDER — ATORVASTATIN CALCIUM 40 MG PO TABS: 40.0000 mg | ORAL_TABLET | Freq: Every day | ORAL | 3 refills | Status: DC

## 2021-01-29 MED ORDER — INSULIN NPH (HUMAN) (ISOPHANE) 100 UNIT/ML ~~LOC~~ SUSP: 10.0000 [IU] | Freq: Every day | SUBCUTANEOUS | 0 refills | Status: DC

## 2021-01-29 NOTE — Progress Notes (Signed)
Subjective:    Patient ID: Jeremiah Thomas, male    DOB: 04-06-1956, 65 y.o.   MRN: [id number]  HPI Pt is a 65 yo obese male with HTN, CHF, OSA, chronic respiratory failure, T2DM, CKD 4 who presents to the clinic for 3 month follow up.   He lost his prescription insurance and has been out of all medications for at least 1 month. He does not feel well but no major concerns. No CP, palpitations, headaches, vision changes. Dizziness is better since decreasing lasix to once a day. He is not checking sugars or blood pressure.  .. Active Ambulatory Problems    Diagnosis Date Noted   History of stroke 05/20/2016   Asymptomatic hypertensive urgency 05/20/2016   Acute kidney injury ([place]) 06/02/2016   Essential hypertension, benign 06/02/2016   CKD (chronic kidney disease) stage 3, GFR 30-59 ml/min (HCC) 06/03/2016   Frequent urination at night 05/13/2017   Elevated PSA 07/20/2017   Elevated fasting glucose 07/20/2017   Nasal congestion 08/15/2017   Morbidly obese ([place]) 09/08/2017   Type II diabetes mellitus, uncontrolled ([place]) 09/13/2017   Bilateral leg edema 02/21/2018   Craving for particular food 02/21/2018   Newly recognized heart murmur 02/21/2018   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   Non-restorative sleep 06/13/2018   Snoring 06/13/2018   No energy 06/13/2018   OSA (obstructive sleep apnea) 08/22/2018   Paresthesia 09/12/2018   Lightheaded 09/12/2018   Orthostatic hypotension 12/07/2018   Pneumonia due to COVID-19 virus 08/25/2019   Chronic respiratory failure with hypoxia (HCC) 08/25/2019   Elevated lipase 08/25/2019   Low TSH level 08/25/2019   Bilateral pulmonary embolism ([place]) 08/25/2019   Lower GI bleed 08/25/2019   Malnutrition of mild degree ([place]) 08/25/2019   Serum albumin decreased 08/25/2019   Anemia 08/25/2019   History of COVID-19 09/20/2019   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, without long-term current use of insulin ([place]) 09/20/2019    Depressed mood 09/20/2019   Former smoker 12/19/2019   Benign prostatic hyperplasia without lower urinary tract symptoms 12/19/2019   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     Objective:   Physical Exam Vitals reviewed.  Constitutional:      Appearance: He is obese.  HENT:     Head: Normocephalic.  Cardiovascular:     Rate and Rhythm: Normal rate and regular rhythm.     Heart sounds: Murmur heard.  Pulmonary:     Effort: Pulmonary effort is normal.     Breath sounds: Normal breath sounds.  Musculoskeletal:     Right lower leg: Edema present.     Left lower leg: Edema present.     Comments: Generalized edema mild bilateral ankles/legs.   Neurological:     Mental Status: He is alert.  Psychiatric:        Mood and Affect: Mood normal.         .. Lab Results  Component Value Date   HGBA1C 9.0 (A) 01/29/2021    Assessment & Plan:  .Marland KitchenJohn was seen today for diabetes.  Diagnoses and all orders for this visit:  Uncontrolled type 2 diabetes mellitus with  hyperglycemia (HCC) -     POCT glycosylated hemoglobin (Hb A1C) -     glipiZIDE (GLUCOTROL) 10 MG tablet; Take 1 tablet (10 mg total) by mouth 2 (two) times daily before a meal. -     insulin NPH Human (NOVOLIN N) 100 UNIT/ML injection; Inject 0.1 mLs (10 Units total) into the skin at bedtime.  Essential hypertension, benign -     amLODipine (NORVASC) 5 MG tablet; Take 1 tablet (5 mg total) by mouth daily. -     metoprolol tartrate (LOPRESSOR) 100 MG tablet; TAKE 1 TABLET BY MOUTH TWICE DAILY -     cloNIDine (CATAPRES) tablet 0.1 mg -     EKG 12-Lead  History of  stroke -     atorvastatin (LIPITOR) 40 MG tablet; Take 1 tablet (40 mg total) by mouth daily. -     EKG 12-Lead  Depressed mood -     DULoxetine (CYMBALTA) 30 MG capsule; TAKE 1 CAPSULE(30 MG) BY MOUTH DAILY  Frequent urination at night -     finasteride (PROSCAR) 5 MG tablet; TAKE 1 TABLET(5 MG) BY MOUTH DAILY  Prostate cancer (HCC) -     finasteride (PROSCAR) 5 MG tablet; TAKE 1 TABLET(5 MG) BY MOUTH DAILY  Bilateral leg edema -     furosemide (LASIX) 20 MG tablet; Take 1 tablet (20 mg total) by mouth 2 (two) times daily.  Gastroesophageal reflux disease, unspecified whether esophagitis present -     pantoprazole (PROTONIX) 40 MG tablet; TAKE 1 TABLET(40 MG) BY MOUTH TWICE DAILY  Chronic diastolic congestive heart failure (HCC)  Asymptomatic hypertensive urgency  OSA (obstructive sleep apnea)  CKD (chronic kidney disease) stage 4, GFR 15-29 ml/min (HCC)  Pt is not doing well due to being out of medications for at least a month or more.  Pt is generally asymptomatic with elevated blood pressure.  Clonadine was given in office with minimal improvement.  Restart medications. Given good rx coupon follow up in 2 days on Friday.  Pt was seen by cardiology recent for orthostatic hypotension and BP medications adjusted lasix to once a day   EKG:  Looks like 1st degree AV block with increased PR interval.  ? New RBBB. Not sure how much of changes is due to BP being so elevated. Needs to follow up with cardiology.  No ST elevation or depression. No CP.  A1C climbing due to medication loss.  Added glipizide and novolin 10 units at bedtime.  Cannot take metformin due to renal function. CKD stage 4.   Number for medicare supplement specialist given.   Printed all medication and discussed good rx for all things right now. Let us know if there is something you cannot get.    Spent 40 minutes with patient discussing medications and how to pay for them without his supplement.  Discussed warning signs and symptoms and when to go to ED.

## 2021-01-29 NOTE — Patient Instructions (Addendum)
[facility] in [place] for medication.  Good rx check into every medication.   [place]  [address], [place] [address] [phone]

## 2021-01-31 ENCOUNTER — Ambulatory Visit (INDEPENDENT_AMBULATORY_CARE_PROVIDER_SITE_OTHER): Payer: Medicare Other | Admitting: Physician Assistant

## 2021-01-31 ENCOUNTER — Telehealth: Payer: Self-pay | Admitting: Physician Assistant

## 2021-01-31 ENCOUNTER — Encounter: Payer: Self-pay | Admitting: Physician Assistant

## 2021-01-31 ENCOUNTER — Other Ambulatory Visit: Payer: Self-pay

## 2021-01-31 VITALS — BP 145/80 | HR 65

## 2021-01-31 DIAGNOSIS — I4510 Unspecified right bundle-branch block: Secondary | ICD-10-CM | POA: Insufficient documentation

## 2021-01-31 DIAGNOSIS — I10 Essential (primary) hypertension: Secondary | ICD-10-CM | POA: Diagnosis not present

## 2021-01-31 DIAGNOSIS — N184 Chronic kidney disease, stage 4 (severe): Secondary | ICD-10-CM

## 2021-01-31 DIAGNOSIS — R251 Tremor, unspecified: Secondary | ICD-10-CM

## 2021-01-31 DIAGNOSIS — I440 Atrioventricular block, first degree: Secondary | ICD-10-CM | POA: Insufficient documentation

## 2021-01-31 NOTE — Progress Notes (Signed)
Subjective:    Patient ID: Jeremiah Thomas, male    DOB: 1955/06/28, 65 y.o.   MRN: [id number]  HPI Pt is a 65 yo obese male with uncontrolled blood pressure after stopping all medications. He was last seen 01/29/2021. Denies any CP, palpitations, headaches or vision changes. He is dizzy from time to time when standing. He has been taking norvasc, metoprolol and proscar. He is not taking any diuretics. He is feeling better than the other day.   .. Active Ambulatory Problems    Diagnosis Date Noted   History of stroke 05/20/2016   Asymptomatic hypertensive urgency 05/20/2016   Acute kidney injury ([place]) 06/02/2016   Essential hypertension, benign 06/02/2016   CKD (chronic kidney disease) stage 3, GFR 30-59 ml/min (HCC) 06/03/2016   Frequent urination at night 05/13/2017   Elevated PSA 07/20/2017   Elevated fasting glucose 07/20/2017   Nasal congestion 08/15/2017   Morbidly obese ([place]) 09/08/2017   Type II diabetes mellitus, uncontrolled ([place]) 09/13/2017   Bilateral leg edema 02/21/2018   Craving for particular food 02/21/2018   Newly recognized heart murmur 02/21/2018   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   Non-restorative sleep 06/13/2018   Snoring 06/13/2018   No energy 06/13/2018   OSA (obstructive sleep apnea) 08/22/2018   Paresthesia 09/12/2018   Lightheaded 09/12/2018   Orthostatic hypotension 12/07/2018   Pneumonia due to COVID-19 virus 08/25/2019   Chronic respiratory failure with hypoxia (HCC) 08/25/2019   Elevated lipase 08/25/2019   Low TSH level 08/25/2019   Bilateral pulmonary embolism ([place]) 08/25/2019   Lower GI bleed 08/25/2019   Malnutrition of mild degree ([place]) 08/25/2019   Serum albumin decreased 08/25/2019   Anemia 08/25/2019   History of COVID-19 09/20/2019   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, without long-term current use of insulin ([place]) 09/20/2019   Depressed mood 09/20/2019   Former smoker 12/19/2019   Benign prostatic hyperplasia  without lower urinary tract symptoms 12/19/2019   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     Objective:   Physical Exam Vitals reviewed.  Constitutional:      Appearance: Normal appearance. He is obese.  HENT:     Head: Normocephalic.  Cardiovascular:     Rate and Rhythm: Normal rate and regular rhythm.     Pulses: Normal pulses.  Lymphadenopathy:     Cervical: No cervical adenopathy.  Neurological:     General: No focal deficit present.     Mental Status: He is alert and oriented to person, place, and time.     Comments: Tremor noted.   Psychiatric:        Mood and Affect: Mood normal.          Assessment & Plan:  .Marland KitchenJohn was seen today for hypertension.  Diagnoses and all orders for this visit:  Essential hypertension, benign   BP much better. Pt under a lot of stress today. Continue on same medications. Continue to follow up with cardiology. Via last cardiology note willing to let BP not be quite to  goal due to severe syncope.   If tremor present will evaluate better at next visit.   Make sure taking DM medications if you cannot afford them or figure out how to get them please reach out.   Follow up in 1 month.

## 2021-01-31 NOTE — Telephone Encounter (Signed)
Jeremiah Thomas - could you call the rep and see if we can get Trelegy samples? We do not currently have any.

## 2021-01-31 NOTE — Telephone Encounter (Signed)
Do we have can we call about trelegy samples?

## 2021-02-03 DIAGNOSIS — R251 Tremor, unspecified: Secondary | ICD-10-CM | POA: Insufficient documentation

## 2021-02-03 DIAGNOSIS — G252 Other specified forms of tremor: Secondary | ICD-10-CM | POA: Insufficient documentation

## 2021-02-04 NOTE — Telephone Encounter (Signed)
Drug rep dropped off samples on 02/03/21.

## 2021-02-06 ENCOUNTER — Telehealth: Payer: Self-pay | Admitting: Neurology

## 2021-02-06 MED ORDER — "BD VEO INSULIN SYR U/F 1/2UNIT 31G X 15/64"" 0.3 ML MISC": 0 refills | Status: DC

## 2021-02-06 NOTE — Telephone Encounter (Signed)
RX sent

## 2021-02-28 ENCOUNTER — Other Ambulatory Visit: Payer: Self-pay

## 2021-02-28 ENCOUNTER — Ambulatory Visit (INDEPENDENT_AMBULATORY_CARE_PROVIDER_SITE_OTHER): Payer: Medicare Other | Admitting: Physician Assistant

## 2021-02-28 ENCOUNTER — Encounter: Payer: Self-pay | Admitting: Physician Assistant

## 2021-02-28 VITALS — BP 109/61 | HR 68 | Temp 98.6°F | Ht 74.0 in | Wt 309.0 lb

## 2021-02-28 DIAGNOSIS — E1165 Type 2 diabetes mellitus with hyperglycemia: Secondary | ICD-10-CM | POA: Diagnosis not present

## 2021-02-28 DIAGNOSIS — I10 Essential (primary) hypertension: Secondary | ICD-10-CM

## 2021-02-28 DIAGNOSIS — N184 Chronic kidney disease, stage 4 (severe): Secondary | ICD-10-CM

## 2021-02-28 DIAGNOSIS — I5032 Chronic diastolic (congestive) heart failure: Secondary | ICD-10-CM | POA: Diagnosis not present

## 2021-02-28 NOTE — Progress Notes (Signed)
Subjective:    Patient ID: Jeremiah Thomas, male    DOB: 01/20/1956, 65 y.o.   MRN: [id number]  HPI Pt is a 65 yo obese male with HTN, T2DM, CKD-4, CHF, OSA who presents to the clinic to follow up on HTN.   Pt had a period of uncontrolled blood pressure after stopping medication suddenly due to cost. 8/19 BP was much better but not quite to goal. He has also struggling with some orthostatic hypotension. Today he denies any dizziness, CP, palpitations, headaches. He is taking norvasc 5mg  qd, metoprolol 100mg  bid, lasix 20mg  bid.   Continues to check sugars in mornings and running around 125. .. Lab Results  Component Value Date   HGBA1C 9.0 (A) 01/29/2021   Was not to goal. He is taking 10 units of novolin at bedtime and glipizide bid.   .. Active Ambulatory Problems    Diagnosis Date Noted   History of stroke 05/20/2016   Asymptomatic hypertensive urgency 05/20/2016   Acute kidney injury ([place]) 06/02/2016   Essential hypertension, benign 06/02/2016   CKD (chronic kidney disease) stage 3, GFR 30-59 ml/min (HCC) 06/03/2016   Frequent urination at night 05/13/2017   Elevated PSA 07/20/2017   Elevated fasting glucose 07/20/2017   Nasal congestion 08/15/2017   Morbidly obese ([place]) 09/08/2017   Type II diabetes mellitus, uncontrolled ([place]) 09/13/2017   Bilateral leg edema 02/21/2018   Craving for particular food 02/21/2018   Newly recognized heart murmur 02/21/2018   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   Non-restorative sleep 06/13/2018   Snoring 06/13/2018   No energy 06/13/2018   OSA (obstructive sleep apnea) 08/22/2018   Paresthesia 09/12/2018   Lightheaded 09/12/2018   Orthostatic hypotension 12/07/2018   Pneumonia due to COVID-19 virus 08/25/2019   Chronic respiratory failure with hypoxia (HCC) 08/25/2019   Elevated lipase 08/25/2019   Low TSH level 08/25/2019   Bilateral pulmonary embolism ([place]) 08/25/2019   Lower GI bleed 08/25/2019   Malnutrition of mild degree ([place]) 08/25/2019    Serum albumin decreased 08/25/2019   Anemia 08/25/2019   History of COVID-19 09/20/2019   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, without long-term current use of insulin ([place]) 09/20/2019   Depressed mood 09/20/2019   Former smoker 12/19/2019   Benign prostatic hyperplasia without lower urinary tract symptoms 12/19/2019   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     Objective:   Physical Exam Vitals reviewed.  Constitutional:      Appearance: Normal appearance. He is obese.  Cardiovascular:     Rate and Rhythm: Normal rate and regular rhythm.     Pulses: Normal pulses.     Heart sounds: Murmur heard.  Pulmonary:     Effort: Pulmonary effort is normal.     Breath sounds: Normal breath sounds.  Musculoskeletal:     Right lower leg: No edema.     Left lower leg: No edema.  Neurological:     Mental Status: He is alert.  Psychiatric:  Mood and Affect: Mood normal.          Assessment & Plan:  .Marland KitchenJohn was seen today for hypertension.  Diagnoses and all orders for this visit:  Essential hypertension, benign  Uncontrolled type 2 diabetes mellitus with hyperglycemia (HCC)  CKD (chronic kidney disease) stage 4, GFR 15-29 ml/min (HCC)  Chronic diastolic congestive heart failure (HCC)   BP a little on low side but patient is asymptomatic. He is feeling really good. This BP is great for kidneys.  Follow up in 2 months. Stay on same  medications.   Too soon for A1C. Fasting sugars are better at 125.  Cannot afford branded medication.  On glipizide and novolin.

## 2021-03-03 DIAGNOSIS — I5032 Chronic diastolic (congestive) heart failure: Secondary | ICD-10-CM | POA: Insufficient documentation

## 2021-03-17 ENCOUNTER — Other Ambulatory Visit: Payer: Self-pay

## 2021-03-17 ENCOUNTER — Encounter: Payer: Self-pay | Admitting: Cardiology

## 2021-03-17 ENCOUNTER — Ambulatory Visit (INDEPENDENT_AMBULATORY_CARE_PROVIDER_SITE_OTHER): Payer: HMO | Admitting: Cardiology

## 2021-03-17 VITALS — BP 124/88 | HR 78 | Ht 74.0 in | Wt 311.0 lb

## 2021-03-17 DIAGNOSIS — I951 Orthostatic hypotension: Secondary | ICD-10-CM

## 2021-03-17 DIAGNOSIS — G4733 Obstructive sleep apnea (adult) (pediatric): Secondary | ICD-10-CM

## 2021-03-17 DIAGNOSIS — R600 Localized edema: Secondary | ICD-10-CM | POA: Diagnosis not present

## 2021-03-17 DIAGNOSIS — I10 Essential (primary) hypertension: Secondary | ICD-10-CM | POA: Diagnosis not present

## 2021-03-17 NOTE — Patient Instructions (Signed)

## 2021-03-17 NOTE — Progress Notes (Signed)
Cardiology Office Note:    Date:  03/17/2021   ID:  Jeremiah Thomas, DOB 1955-12-13, MRN [id number]  PCP:  [name], PA-C  Cardiologist:  [name], MD    Referring MD: [name], PA-C   No chief complaint on file. I am doing better  History of Present Illness:    Jeremiah Thomas is a 65 y.o. male with past medical history significant for orthostatic hypotension, chronic kidney failure with creatinine in the neighborhood of 2.4, essential hypertension, obstructive sleep apnea rarely use CPAP mask, prostate problem with some urinary retention, dyslipidemia.  He was referred to [place] because of orthostatic hypotension.  Situation was quite difficult because he takes medication for his blood pressure and many of this medication can create orthostatic hypotension on top of that he was on Flomax.  We had a long discussion about what to do with the situation and eventually we elected to stop the morning dose of Flomax however he did started feeling better and eventually completely discontinue Flomax and now he does not take Flomax anymore he said he is feeling better and he does not have much orthostatic hypotension.  He does not have problem urinating is at.  Denies have any chest pain tightness squeezing pressure burning chest.  He always got some swelling of lower extremities which does not bother him at all.  Past Medical History:  Diagnosis Date   Acute kidney injury ([place]) 06/02/2016   Anemia 08/25/2019   Asymptomatic hypertensive urgency 05/20/2016   Benign prostatic hyperplasia without lower urinary tract symptoms 12/19/2019   Bilateral leg edema 02/21/2018   Bilateral pulmonary embolism ([place]) 08/25/2019   Chronic respiratory failure with hypoxia ([place]) 08/25/2019   CKD (chronic kidney disease) stage 3, GFR 30-59 ml/min ([place]) 06/03/2016   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, without long-term current use of insulin ([place]) 09/20/2019   Craving for particular food  02/21/2018   Depressed mood 09/20/2019   Elevated fasting glucose 07/20/2017   Elevated lipase 08/25/2019   Elevated PSA 07/20/2017   Essential hypertension, benign 06/02/2016   Normal left ventricular size and systolic function with no appreciable segmental abnormality. Ejection fraction is visually estimated at 62-37% LV diastolic function is mildly abnormal, but there is no evidence of elevated LVEDP or diastolic heart failure. Mild concentric left ventricular hypertrophy. No significant valvular stenosis or regurgitation. 05/2016   Former smoker 12/19/2019   Frequent urination at night 05/13/2017   History of COVID-19 09/20/2019   History of stroke 05/20/2016   Carotid doppler 05/2016 Mild atherosclerosis less than 39 percent.    Hypertension    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   Lightheaded 09/12/2018   Low TSH level 08/25/2019   Lower GI bleed 08/25/2019   Malnutrition of mild degree ([place]) 08/25/2019   Morbidly obese ([place]) 09/08/2017   Nasal congestion 08/15/2017   Newly recognized heart murmur 02/21/2018   No energy 06/13/2018   Non-restorative sleep 06/13/2018   Orthostatic hypotension 12/07/2018   OSA (obstructive sleep apnea) 08/22/2018   Sleep study 08/2018.   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    Serum albumin decreased 08/25/2019   Snoring 06/13/2018   Type II diabetes mellitus, uncontrolled 09/13/2017    Past Surgical History:  Procedure Laterality Date   VEIN REMOVED Right    Rt leg    Current  Medications: Current Meds  Medication Sig   albuterol (VENTOLIN HFA) 108 (90 Base) MCG/ACT inhaler INHALE 2 PUFFS INTO THE LUNGS EVERY 6 HOURS AS NEEDED FOR WHEEZING OR SHORTNESS OF BREATH   AMBULATORY NON FORMULARY MEDICATION Continuous positive airway pressure (CPAP) machine set at autopap from 5-20cmH20, with all  supplemental supplies as needed.   amLODipine (NORVASC) 5 MG tablet Take 1 tablet (5 mg total) by mouth daily.   aspirin EC 81 MG tablet Take 81 mg by mouth daily.   atorvastatin (LIPITOR) 40 MG tablet Take 1 tablet (40 mg total) by mouth daily.   Blood Glucose Monitoring Suppl (MM EASY TOUCH GLUCOSE METER) w/Device KIT 1 Stick by Does not apply route daily. Check fasting blood sugar daily. Dx - DM ICD-10- E11.8   DULoxetine (CYMBALTA) 30 MG capsule TAKE 1 CAPSULE(30 MG) BY MOUTH DAILY   finasteride (PROSCAR) 5 MG tablet TAKE 1 TABLET(5 MG) BY MOUTH DAILY   fluticasone (FLONASE) 50 MCG/ACT nasal spray USE 2 SPRAYS IN EACH NOSTRIL DAILY   Fluticasone-Umeclidin-Vilant (TRELEGY ELLIPTA) 100-62.5-25 MCG/INH AEPB Inhale 1 puff into the lungs daily.   furosemide (LASIX) 20 MG tablet Take 1 tablet (20 mg total) by mouth 2 (two) times daily.   glipiZIDE (GLUCOTROL) 10 MG tablet Take 1 tablet (10 mg total) by mouth 2 (two) times daily before a meal.   glucose blood test strip Check fasting blood sugar daily. Dx - DM ICD-10- E11.8   hydrALAZINE (APRESOLINE) 100 MG tablet Take 100 mg by mouth 2 (two) times daily.   insulin NPH Human (NOVOLIN N) 100 UNIT/ML injection Inject 0.1 mLs (10 Units total) into the skin at bedtime.   Insulin Syringe-Needle U-100 (BD VEO INSULIN SYR U/F 1/2UNIT) 31G X 15/64" 0.3 ML MISC To use with novolin insulin   Lancets MISC Check fasting blood sugar daily. Dx - DM ICD-10- E11.8   metoprolol tartrate (LOPRESSOR) 100 MG tablet TAKE 1 TABLET BY MOUTH TWICE DAILY   pantoprazole (PROTONIX) 40 MG tablet TAKE 1 TABLET(40 MG) BY MOUTH TWICE DAILY     Allergies:   Patient has no known allergies.   Social History   Socioeconomic History   Marital status: Married    Spouse name: Not on file   Number of children: Not on file   Years of education: Not on file   Highest education level: Not on file  Occupational History   Not on file  Tobacco Use   Smoking status: Former    Smokeless tobacco: Never  Substance and Sexual Activity   Alcohol use: No   Drug use: No   Sexual activity: Not Currently  Other Topics Concern   Not on file  Social History Narrative   Not on file   Social Determinants of Health   Financial Resource Strain: Not on file  Food Insecurity: Not on file  Transportation Needs: Not on file  Physical Activity: Not on file  Stress: Not on file  Social Connections: Not on file     Family History: The patient's family history includes Cancer in his mother; Diabetes in his father; Hypertension in his father; Stroke in his father. ROS:   Please see the history of present illness.    All 14 point review of systems negative except as described per history of present illness  EKGs/Labs/Other Studies Reviewed:      Recent Labs: 09/20/2020: ALT 26; BUN 25; Creat 2.98; Hemoglobin 14.2; Platelets 263; Potassium 4.4; Sodium 142  Recent Lipid Panel  Component Value Date/Time   CHOL 105 12/19/2019 0922   TRIG 112 12/19/2019 0922   HDL 44 12/19/2019 0922   CHOLHDL 2.4 12/19/2019 0922   LDLCALC 41 12/19/2019 0922    Physical Exam:    VS:  BP 124/88 (BP Location: Left Arm, Patient Position: Sitting, Cuff Size: Large)   Pulse 78   Ht _0  (1.88 m)   Wt (!) 311 lb (141.1 kg)   SpO2 97%   BMI 39.93 kg/m     Wt Readings from Last 3 Encounters:  03/17/21 (!) 311 lb (141.1 kg)  02/28/21 (!) 309 lb (140.2 kg)  01/29/21 (!) 315 lb (142.9 kg)     GEN:  Well nourished, well developed in no acute distress HEENT: Normal NECK: No JVD; No carotid bruits LYMPHATICS: No lymphadenopathy CARDIAC: RRR, no murmurs, no rubs, no gallops RESPIRATORY:  Clear to auscultation without rales, wheezing or rhonchi  ABDOMEN: Soft, non-tender, non-distended MUSCULOSKELETAL:  No edema; No deformity  SKIN: Warm and dry LOWER EXTREMITIES: no swelling NEUROLOGIC:  Alert and oriented x 3 PSYCHIATRIC:  Normal affect   ASSESSMENT:    1. Orthostatic  hypotension   2. Essential hypertension, benign   3. OSA (obstructive sleep apnea)   4. Bilateral leg edema    PLAN:    In order of problems listed above:  Orthostatic hypotension.  We discontinued Flomax with quite good results like he is still have no problem urinating.  I told him if orthostatic hypotension became a problem he needs to let me know also I told him if he still having problem with urinary retention he is to contact his urologist to look alternative management way of this problem. Essential hypertension seems to be controlled we will continue present management. Obstructive sleep apnea, he does not use CPAP mask on the regular basis I strongly advised him to do that. Bilateral leg edema is a chronic problem.  Multifactorial, he takes amlodipine which contribute to this however overall does not bother him much, therefore we will continue present management. Dyslipidemia I did review his K PN which show me LDL of 41 HDL 44 we will continue present management Diabetes lab data from K PN from January 29, 2021 with hemoglobin A1c 9.0 not well controlled he understand he is to get better control of his diabetes.   Medication Adjustments/Labs and Tests Ordered: Current medicines are reviewed at length with the patient today.  Concerns regarding medicines are outlined above.  No orders of the defined types were placed in this encounter.  Medication changes: No orders of the defined types were placed in this encounter.   Signed, Park Liter, MD, [facility] 03/17/2021 9:23 AM    [place]

## 2021-04-08 ENCOUNTER — Other Ambulatory Visit: Payer: Self-pay | Admitting: *Deleted

## 2021-04-08 DIAGNOSIS — E1165 Type 2 diabetes mellitus with hyperglycemia: Secondary | ICD-10-CM

## 2021-04-08 MED ORDER — GLIPIZIDE 10 MG PO TABS: 10.0000 mg | ORAL_TABLET | Freq: Two times a day (BID) | ORAL | 1 refills | Status: DC

## 2021-04-09 ENCOUNTER — Other Ambulatory Visit: Payer: Self-pay | Admitting: Neurology

## 2021-04-09 DIAGNOSIS — E1165 Type 2 diabetes mellitus with hyperglycemia: Secondary | ICD-10-CM

## 2021-04-09 MED ORDER — GLIPIZIDE 10 MG PO TABS: 10.0000 mg | ORAL_TABLET | Freq: Two times a day (BID) | ORAL | 0 refills | Status: DC

## 2021-04-29 ENCOUNTER — Other Ambulatory Visit: Payer: Self-pay | Admitting: Physician Assistant

## 2021-04-29 DIAGNOSIS — E1165 Type 2 diabetes mellitus with hyperglycemia: Secondary | ICD-10-CM

## 2021-04-30 ENCOUNTER — Other Ambulatory Visit: Payer: Self-pay

## 2021-04-30 ENCOUNTER — Ambulatory Visit (INDEPENDENT_AMBULATORY_CARE_PROVIDER_SITE_OTHER): Payer: HMO | Admitting: Physician Assistant

## 2021-04-30 ENCOUNTER — Encounter: Payer: Self-pay | Admitting: Physician Assistant

## 2021-04-30 VITALS — BP 118/70 | HR 74 | Temp 98.0°F | Ht 74.0 in | Wt 311.0 lb

## 2021-04-30 DIAGNOSIS — N184 Chronic kidney disease, stage 4 (severe): Secondary | ICD-10-CM | POA: Diagnosis not present

## 2021-04-30 DIAGNOSIS — E1165 Type 2 diabetes mellitus with hyperglycemia: Secondary | ICD-10-CM

## 2021-04-30 DIAGNOSIS — E785 Hyperlipidemia, unspecified: Secondary | ICD-10-CM

## 2021-04-30 DIAGNOSIS — I5032 Chronic diastolic (congestive) heart failure: Secondary | ICD-10-CM | POA: Diagnosis not present

## 2021-04-30 DIAGNOSIS — I10 Essential (primary) hypertension: Secondary | ICD-10-CM

## 2021-04-30 DIAGNOSIS — E1169 Type 2 diabetes mellitus with other specified complication: Secondary | ICD-10-CM

## 2021-04-30 DIAGNOSIS — Z1211 Encounter for screening for malignant neoplasm of colon: Secondary | ICD-10-CM

## 2021-04-30 DIAGNOSIS — Z1159 Encounter for screening for other viral diseases: Secondary | ICD-10-CM

## 2021-04-30 DIAGNOSIS — Z13220 Encounter for screening for lipoid disorders: Secondary | ICD-10-CM

## 2021-04-30 MED ORDER — GLIPIZIDE 10 MG PO TABS: 10.0000 mg | ORAL_TABLET | Freq: Two times a day (BID) | ORAL | 0 refills | Status: DC

## 2021-04-30 MED ORDER — SITAGLIPTIN PHOSPHATE 100 MG PO TABS: 100.0000 mg | ORAL_TABLET | Freq: Every day | ORAL | 0 refills | Status: DC

## 2021-04-30 MED ORDER — INSULIN NPH (HUMAN) (ISOPHANE) 100 UNIT/ML ~~LOC~~ SUSP: 10.0000 [IU] | Freq: Every day | SUBCUTANEOUS | 0 refills | Status: DC

## 2021-04-30 NOTE — Progress Notes (Signed)
Subjective:    Patient ID: Jeremiah Thomas, male    DOB: 01-27-1956, 65 y.o.   MRN: [id number]  HPI Pt is a 65 yo obese male with T2DM, HTN, hx of PE, HLD, MDD who presents to the clinic for 3 month follow up.   He is on glipizide and novalin because of affordability of other medications. Checking sugars at home and running 120-150 in the morning. No hypoglycemia. No open wounds or sores. He tries to walk every day.   No CP, palpitations, SOB, headaches, dizziness.   .. Active Ambulatory Problems    Diagnosis Date Noted   History of stroke 05/20/2016   Asymptomatic hypertensive urgency 05/20/2016   Acute kidney injury ([place]) 06/02/2016   Essential hypertension, benign 06/02/2016   CKD (chronic kidney disease) stage 3, GFR 30-59 ml/min (HCC) 06/03/2016   Frequent urination at night 05/13/2017   Elevated PSA 07/20/2017   Elevated fasting glucose 07/20/2017   Nasal congestion 08/15/2017   Morbidly obese ([place]) 09/08/2017   Type II diabetes mellitus, uncontrolled 09/13/2017   Bilateral leg edema 02/21/2018   Craving for particular food 02/21/2018   Newly recognized heart murmur 02/21/2018   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   Non-restorative sleep 06/13/2018   Snoring 06/13/2018   No energy 06/13/2018   OSA (obstructive sleep apnea) 08/22/2018   Paresthesia 09/12/2018   Lightheaded 09/12/2018   Orthostatic hypotension 12/07/2018   Pneumonia due to COVID-19 virus 08/25/2019   Chronic respiratory failure with hypoxia (HCC) 08/25/2019   Elevated lipase 08/25/2019   Low TSH level 08/25/2019   Bilateral pulmonary embolism ([place]) 08/25/2019   Lower GI bleed 08/25/2019   Malnutrition of mild degree ([place]) 08/25/2019   Serum albumin decreased 08/25/2019   Anemia 08/25/2019   History of COVID-19 09/20/2019   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, without long-term current use of insulin ([place]) 09/20/2019   Depressed mood 09/20/2019   Former smoker 12/19/2019   Benign  prostatic hyperplasia without lower urinary tract symptoms 12/19/2019   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     Objective:   Physical Exam Vitals reviewed.  Constitutional:      Appearance: Normal appearance. He is obese.  HENT:     Head: Normocephalic.  Neck:     Vascular: No carotid bruit.  Cardiovascular:     Rate and Rhythm: Normal rate and regular rhythm.     Pulses: Normal pulses.  Pulmonary:     Effort: Pulmonary effort is normal.  Musculoskeletal:     Comments: Dry scaly bilateral legs.   Neurological:     General: No focal deficit present.     Mental Status: He is alert and oriented to person, place, and time.  Psychiatric:        Mood and Affect: Mood normal.      .. Depression screen [facility] 2/9 01/29/2021 12/19/2019 09/18/2019 06/06/2018 03/08/2018  Decreased Interest 3 2 2  3 1  Down, Depressed, Hopeless 1 1 1 1 1   PHQ - 2 Score 4 3 3 4 2   Altered sleeping 3 0 2 2 3   Tired, decreased energy 2 1 3 3 3   Change in appetite 2 1 0 2 2  Feeling bad or failure about yourself  1 1 1 1 1   Trouble concentrating 2 2 0 3 3  Moving slowly or fidgety/restless 1 0 1 1 1   Suicidal thoughts 0 0 0 0 0  PHQ-9 Score 15 8 10 16 15   Difficult doing work/chores Somewhat difficult - Not difficult at all Very difficult Somewhat difficult   .Marland Kitchen GAD 7 : Generalized Anxiety Score 01/29/2021 12/19/2019  09/18/2019 06/06/2018  Nervous, Anxious, on Edge 1 0 1 1  Control/stop worrying 1 1 1 1   Worry too much - different things 1 1 1 1   Trouble relaxing 2 1 1 2   Restless 2 1 0 1  Easily annoyed or irritable 2 1 1 2   Afraid - awful might happen 1 0 1 2  Total GAD 7 Score 10 5 6 10   Anxiety Difficulty Somewhat difficult Somewhat difficult Somewhat difficult Somewhat difficult        Assessment & Plan:  .Marland KitchenJohn was seen today for diabetes.  Diagnoses and all orders for this visit:  Uncontrolled type 2 diabetes mellitus with hyperglycemia ([place]) -     Hemoglobin A1c -     sitaGLIPtin (JANUVIA) 100 MG tablet; Take 1 tablet (100 mg total) by mouth daily. -     insulin NPH Human (NOVOLIN N) 100 UNIT/ML injection; Inject 0.1 mLs (10 Units total) into the skin at bedtime. -     glipiZIDE (GLUCOTROL) 10 MG tablet; Take 1 tablet (10 mg total) by mouth 2 (two) times daily before a meal.  CKD (chronic kidney disease) stage 4, GFR 15-29 ml/min (HCC) -     COMPLETE METABOLIC PANEL WITH GFR  Colon cancer screening -     Cologuard  Encounter for hepatitis C screening test for low risk patient -     Hepatitis C Antibody  Screening for lipid disorders -     Lipid Panel w/reflex Direct LDL  Essential hypertension, benign  Chronic diastolic congestive heart failure (HCC)  Hyperlipidemia associated with type 2 diabetes mellitus ([place]) -     Lipid Panel w/reflex Direct LDL  Too soon for A1C.  Added januvia to glipizide and novolin.  .. Diabetic Foot Exam - Simple   Simple Foot Form Diabetic Foot exam was performed with the following findings: Yes 04/30/2021  8:21 AM  Visual Inspection No deformities, no ulcerations, no other skin breakdown bilaterally: Yes Sensation Testing Intact to touch and monofilament testing bilaterally: Yes Pulse Check Posterior Tibialis and Dorsalis pulse intact bilaterally: Yes Comments    BP to goal. Continue same medications.  Needs eye exam.  Declined  covid/flu/shingles/pneumonia vaccine.  Follow up in 3 months.   Never had colonoscopy. No family hx of colon cancer. Cologuard ordered.

## 2021-04-30 NOTE — Patient Instructions (Signed)
Add [place].  Continue glipizide and novolin.

## 2021-05-02 ENCOUNTER — Other Ambulatory Visit: Payer: Self-pay | Admitting: Physician Assistant

## 2021-05-02 DIAGNOSIS — E1165 Type 2 diabetes mellitus with hyperglycemia: Secondary | ICD-10-CM

## 2021-05-25 ENCOUNTER — Other Ambulatory Visit: Payer: Self-pay | Admitting: Physician Assistant

## 2021-05-29 DIAGNOSIS — F3342 Major depressive disorder, recurrent, in full remission: Secondary | ICD-10-CM | POA: Diagnosis not present

## 2021-05-29 DIAGNOSIS — E1165 Type 2 diabetes mellitus with hyperglycemia: Secondary | ICD-10-CM | POA: Diagnosis not present

## 2021-05-29 DIAGNOSIS — N183 Chronic kidney disease, stage 3 unspecified: Secondary | ICD-10-CM | POA: Diagnosis not present

## 2021-05-29 DIAGNOSIS — E261 Secondary hyperaldosteronism: Secondary | ICD-10-CM | POA: Diagnosis not present

## 2021-05-29 DIAGNOSIS — J449 Chronic obstructive pulmonary disease, unspecified: Secondary | ICD-10-CM | POA: Diagnosis not present

## 2021-05-29 DIAGNOSIS — E1151 Type 2 diabetes mellitus with diabetic peripheral angiopathy without gangrene: Secondary | ICD-10-CM | POA: Diagnosis not present

## 2021-05-29 DIAGNOSIS — E1169 Type 2 diabetes mellitus with other specified complication: Secondary | ICD-10-CM | POA: Diagnosis not present

## 2021-05-29 DIAGNOSIS — I130 Hypertensive heart and chronic kidney disease with heart failure and stage 1 through stage 4 chronic kidney disease, or unspecified chronic kidney disease: Secondary | ICD-10-CM | POA: Diagnosis not present

## 2021-05-29 DIAGNOSIS — E1122 Type 2 diabetes mellitus with diabetic chronic kidney disease: Secondary | ICD-10-CM | POA: Diagnosis not present

## 2021-05-29 DIAGNOSIS — E1142 Type 2 diabetes mellitus with diabetic polyneuropathy: Secondary | ICD-10-CM | POA: Diagnosis not present

## 2021-05-29 DIAGNOSIS — Z794 Long term (current) use of insulin: Secondary | ICD-10-CM | POA: Diagnosis not present

## 2021-05-29 LAB — HM DIABETES FOOT EXAM

## 2021-07-01 ENCOUNTER — Other Ambulatory Visit: Payer: Self-pay | Admitting: Physician Assistant

## 2021-07-17 DIAGNOSIS — Z1211 Encounter for screening for malignant neoplasm of colon: Secondary | ICD-10-CM | POA: Diagnosis not present

## 2021-07-22 ENCOUNTER — Other Ambulatory Visit: Payer: Self-pay | Admitting: Physician Assistant

## 2021-07-22 DIAGNOSIS — I10 Essential (primary) hypertension: Secondary | ICD-10-CM

## 2021-07-25 ENCOUNTER — Other Ambulatory Visit: Payer: Self-pay | Admitting: Physician Assistant

## 2021-07-25 DIAGNOSIS — R195 Other fecal abnormalities: Secondary | ICD-10-CM

## 2021-07-25 LAB — COLOGUARD: COLOGUARD: POSITIVE — AB

## 2021-07-25 NOTE — Progress Notes (Signed)
Your cologuard screening for colon cancer was positive. Referral is made to GI for colonoscopy to have this fully evaluated. This does not mean you have colon cancer but it does mean either blood or abnormal dna particles have been detected.

## 2021-07-31 ENCOUNTER — Other Ambulatory Visit: Payer: Self-pay | Admitting: Physician Assistant

## 2021-07-31 DIAGNOSIS — E1165 Type 2 diabetes mellitus with hyperglycemia: Secondary | ICD-10-CM

## 2021-08-01 ENCOUNTER — Encounter: Payer: Self-pay | Admitting: Physician Assistant

## 2021-08-01 ENCOUNTER — Ambulatory Visit (INDEPENDENT_AMBULATORY_CARE_PROVIDER_SITE_OTHER): Payer: HMO | Admitting: Physician Assistant

## 2021-08-01 ENCOUNTER — Other Ambulatory Visit: Payer: Self-pay

## 2021-08-01 VITALS — BP 113/63 | HR 73 | Ht 74.0 in | Wt 312.0 lb

## 2021-08-01 DIAGNOSIS — Z6841 Body Mass Index (BMI) 40.0 and over, adult: Secondary | ICD-10-CM | POA: Diagnosis not present

## 2021-08-01 DIAGNOSIS — C61 Malignant neoplasm of prostate: Secondary | ICD-10-CM

## 2021-08-01 DIAGNOSIS — Z1159 Encounter for screening for other viral diseases: Secondary | ICD-10-CM | POA: Diagnosis not present

## 2021-08-01 DIAGNOSIS — R195 Other fecal abnormalities: Secondary | ICD-10-CM | POA: Diagnosis not present

## 2021-08-01 DIAGNOSIS — E785 Hyperlipidemia, unspecified: Secondary | ICD-10-CM | POA: Diagnosis not present

## 2021-08-01 DIAGNOSIS — E1169 Type 2 diabetes mellitus with other specified complication: Secondary | ICD-10-CM

## 2021-08-01 DIAGNOSIS — E66813 Obesity, class 3: Secondary | ICD-10-CM

## 2021-08-01 DIAGNOSIS — E1165 Type 2 diabetes mellitus with hyperglycemia: Secondary | ICD-10-CM

## 2021-08-01 LAB — POCT GLYCOSYLATED HEMOGLOBIN (HGB A1C): Hemoglobin A1C: 12 % — AB (ref 4.0–5.6)

## 2021-08-01 MED ORDER — TRULICITY 0.75 MG/0.5ML ~~LOC~~ SOAJ: 0.7500 mg | SUBCUTANEOUS | 2 refills | Status: DC

## 2021-08-01 MED ORDER — SITAGLIPTIN PHOSPHATE 100 MG PO TABS: 100.0000 mg | ORAL_TABLET | Freq: Every day | ORAL | 0 refills | Status: DC

## 2021-08-01 MED ORDER — GLIPIZIDE 10 MG PO TABS: 10.0000 mg | ORAL_TABLET | Freq: Two times a day (BID) | ORAL | 0 refills | Status: DC

## 2021-08-01 NOTE — Patient Instructions (Signed)
Novolin increase by 2 units every 5 days until fasting morning sugar are 90-120.

## 2021-08-01 NOTE — Progress Notes (Signed)
Subjective:    Patient ID: Jeremiah Thomas, male    DOB: 1955/11/29, 66 y.o.   MRN: [id number]  HPI Pt is a 66 yo obese male with T2DM, HTN, hx of PE, HLD, COPD, MDD who presents to the clinic for 3 month follow up.   Pt did have positive cologuard and has not gone to colonoscopy yet.   Pt admits he is not taking his medications always like he should. No open sores or wounds. No hypoglyemic events. No open sores or wounds. No hypoglycemia. He is taking 10 units of novolin at bedtime. He is not very active.   .. Lab Results  Component Value Date   HGBA1C 12.0 (A) 08/01/2021     .Marland Kitchen Active Ambulatory Problems    Diagnosis Date Noted   History of stroke 05/20/2016   Asymptomatic hypertensive urgency 05/20/2016   Acute kidney injury ([place]) 06/02/2016   Essential hypertension, benign 06/02/2016   CKD (chronic kidney disease) stage 3, GFR 30-59 ml/min (HCC) 06/03/2016   Frequent urination at night 05/13/2017   Elevated PSA 07/20/2017   Elevated fasting glucose 07/20/2017   Nasal congestion 08/15/2017   Class 3 severe obesity due to excess calories with serious comorbidity and body mass index (BMI) of 40.0 to 44.9 in adult [facility]) 09/08/2017   Type II diabetes mellitus, uncontrolled 09/13/2017   Bilateral leg edema 02/21/2018   Craving for particular food 02/21/2018   Newly recognized heart murmur 02/21/2018   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   Non-restorative sleep 06/13/2018   Snoring 06/13/2018   No energy 06/13/2018   OSA (obstructive sleep apnea) 08/22/2018   Paresthesia 09/12/2018   Lightheaded 09/12/2018   Orthostatic hypotension 12/07/2018   Pneumonia due to COVID-19 virus 08/25/2019   Chronic respiratory failure with hypoxia ([place]) 08/25/2019   Elevated lipase 08/25/2019   Low TSH level 08/25/2019   Bilateral pulmonary embolism ([place]) 08/25/2019   Lower GI bleed 08/25/2019   Malnutrition of mild degree ([place]) 08/25/2019   Serum albumin decreased 08/25/2019   Anemia 08/25/2019    History of COVID-19 09/20/2019   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, without long-term current use of insulin ([place]) 09/20/2019   Depressed mood 09/20/2019   Former smoker 12/19/2019   Benign prostatic hyperplasia without lower urinary tract symptoms 12/19/2019   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   Hypertension    Congestive heart failure (CHF) ([place]) 12/02/2020   CKD (chronic kidney disease) stage 4, GFR 15-29 ml/min (HCC) 01/31/2021   AV block, 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   RBBB (right bundle branch block) 01/31/2021   Tremor 02/03/2021   Chronic diastolic congestive heart failure ([place]) 03/03/2021   Hyperlipidemia associated with type 2 diabetes mellitus ([place]) 04/30/2021   Positive colorectal cancer screening using Cologuard test 08/05/2021   Resolved Ambulatory Problems    Diagnosis Date Noted   Prostate cancer ([place]) 10/22/2017   Past Medical History:  Diagnosis Date   Morbidly obese ([place]) 09/08/2017     Review of Systems See HPI.     Objective:   Physical Exam Vitals reviewed.  Constitutional:      Appearance: Normal appearance. He is obese.  HENT:     Head: Normocephalic.  Cardiovascular:     Rate and Rhythm: Normal rate and regular rhythm.     Pulses: Normal pulses.  Pulmonary:     Effort: Pulmonary effort is normal.     Breath sounds: Normal breath sounds.  Musculoskeletal:     Right  lower leg: No edema.     Left lower leg: No edema.  Lymphadenopathy:     Cervical: No cervical adenopathy.  Skin:    Findings: No rash.  Neurological:     General: No focal deficit present.     Mental Status: He is alert and oriented to person, place, and time.  Psychiatric:        Mood and Affect: Mood normal.          Assessment & Plan:  .Marland KitchenJohn was seen today for follow-up.  Diagnoses and all orders for this visit:  Uncontrolled type 2 diabetes mellitus with hyperglycemia ([place]) -     POCT  glycosylated hemoglobin (Hb A1C) -     COMPLETE METABOLIC PANEL WITH GFR -     sitaGLIPtin (JANUVIA) 100 MG tablet; Take 1 tablet (100 mg total) by mouth daily. -     glipiZIDE (GLUCOTROL) 10 MG tablet; Take 1 tablet (10 mg total) by mouth 2 (two) times daily before a meal. -     Dulaglutide (TRULICITY) 2.03 TD/9.7CB SOPN; Inject 0.75 mg into the skin once a week.  Class 3 severe obesity due to excess calories with serious comorbidity and body mass index (BMI) of 40.0 to 44.9 in adult [facility])  Hyperlipidemia associated with type 2 diabetes mellitus ([place]) -     Lipid Panel w/reflex Direct LDL -     COMPLETE METABOLIC PANEL WITH GFR  Encounter for hepatitis C screening test for low risk patient -     Hepatitis C Antibody  Prostate cancer ([place]) -     PSA  Positive colorectal cancer screening using Cologuard test   A1C not to goal. Stressed taking medications and taking daily Discussed DM diet.  Fasting labs ordered.  Add trulicity to see if covered still having a new system. If starts trulicity will stop [place].  Increased your novolin by 2 units every 5 days until gluocse is between 90-120 in the mornings.   Needs colonoscopy due to positive cologuard.  Referral made.

## 2021-08-04 LAB — COMPLETE METABOLIC PANEL WITH GFR
AG Ratio: 1.3 (calc) (ref 1.0–2.5)
ALT: 19 U/L (ref 9–46)
AST: 12 U/L (ref 10–35)
Albumin: 3.8 g/dL (ref 3.6–5.1)
Alkaline phosphatase (APISO): 101 U/L (ref 35–144)
BUN/Creatinine Ratio: 8 (calc) (ref 6–22)
BUN: 24 mg/dL (ref 7–25)
CO2: 27 mmol/L (ref 20–32)
Calcium: 9.2 mg/dL (ref 8.6–10.3)
Chloride: 103 mmol/L (ref 98–110)
Creat: 3.05 mg/dL — ABNORMAL HIGH (ref 0.70–1.35)
Globulin: 3 g/dL (calc) (ref 1.9–3.7)
Glucose, Bld: 197 mg/dL — ABNORMAL HIGH (ref 65–99)
Potassium: 4.2 mmol/L (ref 3.5–5.3)
Sodium: 139 mmol/L (ref 135–146)
Total Bilirubin: 0.5 mg/dL (ref 0.2–1.2)
Total Protein: 6.8 g/dL (ref 6.1–8.1)
eGFR: 22 mL/min/{1.73_m2} — ABNORMAL LOW (ref >=60)

## 2021-08-04 LAB — HEPATITIS C ANTIBODY
Hepatitis C Ab: NONREACTIVE
SIGNAL TO CUT-OFF: <0.02 (ref <1.00)

## 2021-08-04 LAB — LIPID PANEL W/REFLEX DIRECT LDL
Cholesterol: 152 mg/dL (ref <200)
HDL: 44 mg/dL (ref >=40)
LDL Cholesterol (Calc): 82 mg/dL (calc)
Non-HDL Cholesterol (Calc): 108 mg/dL (calc) (ref <130)
Total CHOL/HDL Ratio: 3.5 (calc) (ref <5.0)
Triglycerides: 162 mg/dL — ABNORMAL HIGH (ref <150)

## 2021-08-04 LAB — PSA: PSA: 4.27 ng/mL — ABNORMAL HIGH (ref <=4.00)

## 2021-08-04 NOTE — Progress Notes (Signed)
See if he was able to afford trulicity. If so he will stop [place].   PSA is up from 1 year ago. He needs to follow up with urology.  Kidney function is worsening. He needs to follow up with neprhology.   Cholesterol is not to goal. Are you taking your cholesterol medication daily as directed.

## 2021-08-05 DIAGNOSIS — R195 Other fecal abnormalities: Secondary | ICD-10-CM | POA: Insufficient documentation

## 2021-08-06 ENCOUNTER — Other Ambulatory Visit: Payer: Self-pay | Admitting: Neurology

## 2021-08-06 DIAGNOSIS — R351 Nocturia: Secondary | ICD-10-CM

## 2021-08-06 DIAGNOSIS — C61 Malignant neoplasm of prostate: Secondary | ICD-10-CM

## 2021-08-06 MED ORDER — FINASTERIDE 5 MG PO TABS: ORAL_TABLET | ORAL | 1 refills | Status: DC

## 2021-08-11 ENCOUNTER — Encounter: Payer: Self-pay | Admitting: Neurology

## 2021-08-18 ENCOUNTER — Ambulatory Visit: Payer: HMO | Admitting: Cardiology

## 2021-08-18 ENCOUNTER — Encounter: Payer: Self-pay | Admitting: Cardiology

## 2021-08-18 ENCOUNTER — Other Ambulatory Visit: Payer: Self-pay

## 2021-08-18 VITALS — BP 128/90 | HR 97 | Ht 72.0 in | Wt 308.0 lb

## 2021-08-18 DIAGNOSIS — Z87891 Personal history of nicotine dependence: Secondary | ICD-10-CM

## 2021-08-18 DIAGNOSIS — I10 Essential (primary) hypertension: Secondary | ICD-10-CM | POA: Diagnosis not present

## 2021-08-18 DIAGNOSIS — E11620 Type 2 diabetes mellitus with diabetic dermatitis: Secondary | ICD-10-CM | POA: Diagnosis not present

## 2021-08-18 DIAGNOSIS — I951 Orthostatic hypotension: Secondary | ICD-10-CM

## 2021-08-18 DIAGNOSIS — G4733 Obstructive sleep apnea (adult) (pediatric): Secondary | ICD-10-CM | POA: Diagnosis not present

## 2021-08-18 DIAGNOSIS — I4510 Unspecified right bundle-branch block: Secondary | ICD-10-CM | POA: Diagnosis not present

## 2021-08-18 DIAGNOSIS — I5032 Chronic diastolic (congestive) heart failure: Secondary | ICD-10-CM | POA: Diagnosis not present

## 2021-08-18 NOTE — Patient Instructions (Signed)

## 2021-08-18 NOTE — Progress Notes (Signed)
Cardiology Office Note:    Date:  08/18/2021   ID:  Jeremiah Thomas, DOB April 16, 1956, MRN [id number]  PCP:  [name], PA-C  Cardiologist:  [name], MD    Referring MD: [name], [place]   Chief Complaint  Patient presents with   Follow-up  Doing well  History of Present Illness:    Jeremiah Thomas is a 66 y.o. male with past medical history significant for orthostatic hypotension, chronic kidney disease with creatinine level of 2.4, essential hypertension, obstructive sleep apnea rarely use CPAP mask, prostate cancer, dyslipidemia, diabetes which is poorly controlled with latest hemoglobin A1c of 12.0. I did see him because of orthostatic hypotension elected to discontinue Flomax and he feels much better still complain of symptoms having dizziness when he gets up quickly but otherwise doing well.  Denies have any chest pain tightness squeezing pressure burning chest pressure shortness of breath.  Swelling of lower extremities still unchanged.  Past Medical History:  Diagnosis Date   Acute kidney injury ([place]) 06/02/2016   Anemia 08/25/2019   Asymptomatic hypertensive urgency 05/20/2016   Benign prostatic hyperplasia without lower urinary tract symptoms 12/19/2019   Bilateral leg edema 02/21/2018   Bilateral pulmonary embolism ([place]) 08/25/2019   Chronic respiratory failure with hypoxia ([place]) 08/25/2019   CKD (chronic kidney disease) stage 3, GFR 30-59 ml/min ([place]) 06/03/2016   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, without long-term current use of insulin ([place]) 09/20/2019   Craving for particular food 02/21/2018   Depressed mood 09/20/2019   Elevated fasting glucose 07/20/2017   Elevated lipase 08/25/2019   Elevated PSA 07/20/2017   Essential hypertension, benign 06/02/2016   Normal left ventricular size and systolic function with no appreciable segmental abnormality. Ejection fraction is visually estimated at 16-01% LV diastolic function is mildly abnormal, but there  is no evidence of elevated LVEDP or diastolic heart failure. Mild concentric left ventricular hypertrophy. No significant valvular stenosis or regurgitation. 05/2016   Former smoker 12/19/2019   Frequent urination at night 05/13/2017   History of COVID-19 09/20/2019   History of stroke 05/20/2016   Carotid doppler 05/2016 Mild atherosclerosis less than 39 percent.    Hypertension    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   Lightheaded 09/12/2018   Low TSH level 08/25/2019   Lower GI bleed 08/25/2019   Malnutrition of mild degree ([place]) 08/25/2019   Morbidly obese ([place]) 09/08/2017   Nasal congestion 08/15/2017   Newly recognized heart murmur 02/21/2018   No energy 06/13/2018   Non-restorative sleep 06/13/2018   Orthostatic hypotension 12/07/2018   OSA (obstructive sleep apnea) 08/22/2018   Sleep study 08/2018.   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    Serum albumin decreased 08/25/2019   Snoring 06/13/2018   Type II diabetes mellitus, uncontrolled 09/13/2017    Past Surgical History:  Procedure Laterality Date   VEIN REMOVED Right    Rt leg    Current Medications: Current Meds  Medication Sig   albuterol (VENTOLIN HFA) 108 (90 Base) MCG/ACT inhaler INHALE 2 PUFFS INTO THE LUNGS EVERY 6 HOURS AS NEEDED FOR WHEEZING OR SHORTNESS OF BREATH (Patient taking differently: Inhale 2 puffs into the lungs every 6 (six) hours as needed for wheezing or shortness of breath. INHALE 2 PUFFS INTO THE LUNGS EVERY 6 HOURS AS NEEDED FOR WHEEZING OR SHORTNESS OF BREATH)  AMBULATORY NON FORMULARY MEDICATION Continuous positive airway pressure (CPAP) machine set at autopap from 5-20cmH20, with all supplemental supplies as needed. (Patient taking differently: Inhale 5-20 cm into the lungs as needed (Sleep aid). Continuous positive airway pressure (CPAP)  machine set at autopap from 5-20cmH20, with all supplemental supplies as needed.)   amLODipine (NORVASC) 5 MG tablet TAKE 1 TABLET(5 MG) BY MOUTH DAILY (Patient taking differently: Take 5 mg by mouth daily.)   aspirin EC 81 MG tablet Take 81 mg by mouth daily.   atorvastatin (LIPITOR) 40 MG tablet Take 1 tablet (40 mg total) by mouth daily.   Blood Glucose Monitoring Suppl (MM EASY TOUCH GLUCOSE METER) w/Device KIT 1 Stick by Does not apply route daily. Check fasting blood sugar daily. Dx - DM ICD-10- E11.8   Dulaglutide (TRULICITY) 2.70 JJ/0.0XF SOPN Inject 0.75 mg into the skin once a week.   DULoxetine (CYMBALTA) 30 MG capsule TAKE 1 CAPSULE(30 MG) BY MOUTH DAILY (Patient taking differently: Take 30 mg by mouth daily. TAKE 1 CAPSULE(30 MG) BY MOUTH DAILY)   finasteride (PROSCAR) 5 MG tablet TAKE 1 TABLET(5 MG) BY MOUTH DAILY (Patient taking differently: Take 5 mg by mouth daily. TAKE 1 TABLET(5 MG) BY MOUTH DAILY)   fluticasone (FLONASE) 50 MCG/ACT nasal spray USE 2 SPRAYS IN EACH NOSTRIL DAILY (Patient taking differently: Place 1 spray into both nostrils daily.)   Fluticasone-Umeclidin-Vilant (TRELEGY ELLIPTA) 100-62.5-25 MCG/INH AEPB Inhale 1 puff into the lungs daily.   furosemide (LASIX) 20 MG tablet Take 1 tablet (20 mg total) by mouth 2 (two) times daily.   glipiZIDE (GLUCOTROL) 10 MG tablet Take 1 tablet (10 mg total) by mouth 2 (two) times daily before a meal.   glucose blood test strip Check fasting blood sugar daily. Dx - DM ICD-10- E11.8 (Patient taking differently: 1 each by Other route as needed for other (Glucose reading). Check fasting blood sugar daily. Dx - DM ICD-10- E11.8)   hydrALAZINE (APRESOLINE) 100 MG tablet TAKE 1 TABLET(100 MG) BY MOUTH TWICE DAILY (Patient taking differently: Take 100 mg by mouth 2 (two) times daily.)   Insulin Syringe-Needle U-100 (BD VEO INSULIN SYR U/F 1/2UNIT) 31G X 15/64" 0.3 ML MISC USE WITH NOVOLIN INSULIN (Patient taking differently: 1 each by  Other route daily. USE WITH NOVOLIN INSULIN)   Lancets MISC Check fasting blood sugar daily. Dx - DM ICD-10- E11.8 (Patient taking differently: 1 each by Other route daily at 6 (six) AM. Check fasting blood sugar daily. Dx - DM ICD-10- E11.8)   metoprolol tartrate (LOPRESSOR) 100 MG tablet TAKE 1 TABLET BY MOUTH TWICE DAILY   NOVOLIN N 100 UNIT/ML injection INJECT 0.1 MLS( 10 UNITS) INTO SKIN AT BEDTIME (Patient taking differently: Inject 10 Units into the skin at bedtime.)   pantoprazole (PROTONIX) 40 MG tablet TAKE 1 TABLET(40 MG) BY MOUTH TWICE DAILY (Patient taking differently: Take 40 mg by mouth 2 (two) times daily. TAKE 1 TABLET(40 MG) BY MOUTH TWICE DAILY)   sitaGLIPtin (JANUVIA) 100 MG tablet Take 1 tablet (100 mg total) by mouth daily.     Allergies:   Patient has no known allergies.   Social History   Socioeconomic History   Marital status: Married    Spouse name: Not on file   Number of children: Not on file   Years of education: Not on file   Highest education level: Not on file  Occupational History   Not on file  Tobacco Use   Smoking status: Former   Smokeless  tobacco: Never  Substance and Sexual Activity   Alcohol use: No   Drug use: No   Sexual activity: Not Currently  Other Topics Concern   Not on file  Social History Narrative   Not on file   Social Determinants of Health   Financial Resource Strain: Not on file  Food Insecurity: Not on file  Transportation Needs: Not on file  Physical Activity: Not on file  Stress: Not on file  Social Connections: Not on file     Family History: The patient's family history includes Cancer in his mother; Diabetes in his father; Hypertension in his father; Stroke in his father. ROS:   Please see the history of present illness.    All 14 point review of systems negative except as described per history of present illness  EKGs/Labs/Other Studies Reviewed:      Recent Labs: 09/20/2020: Hemoglobin 14.2; Platelets  263 08/01/2021: ALT 19; BUN 24; Creat 3.05; Potassium 4.2; Sodium 139  Recent Lipid Panel    Component Value Date/Time   CHOL 152 08/01/2021 0000   TRIG 162 (H) 08/01/2021 0000   HDL 44 08/01/2021 0000   CHOLHDL 3.5 08/01/2021 0000   LDLCALC 82 08/01/2021 0000    Physical Exam:    VS:  BP 128/90 (BP Location: Right Arm, Patient Position: Sitting)    Pulse 97    Ht 6' (1.829 m)    Wt (!) 308 lb (139.7 kg)    SpO2 93%    BMI 41.77 kg/m     Wt Readings from Last 3 Encounters:  08/18/21 (!) 308 lb (139.7 kg)  08/01/21 (!) 312 lb (141.5 kg)  04/30/21 (!) 311 lb (141.1 kg)     GEN:  Well nourished, well developed in no acute distress HEENT: Normal NECK: No JVD; No carotid bruits LYMPHATICS: No lymphadenopathy CARDIAC: RRR, no murmurs, no rubs, no gallops RESPIRATORY:  Clear to auscultation without rales, wheezing or rhonchi  ABDOMEN: Soft, non-tender, non-distended MUSCULOSKELETAL:  No edema; No deformity  SKIN: Warm and dry LOWER EXTREMITIES: no swelling NEUROLOGIC:  Alert and oriented x 3 PSYCHIATRIC:  Normal affect   ASSESSMENT:    1. Orthostatic hypotension   2. Essential hypertension, benign   3. RBBB (right bundle branch block)   4. Chronic diastolic congestive heart failure ([place])   5. OSA (obstructive sleep apnea)   6. 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, without long-term current use of insulin ([place])   7. Former smoker    PLAN:    In order of problems listed above:  Orthostatic hypotension stable reasonably controlled baseline blood pressure is fine. Essential hypertension: Blood pressure controlled continue present management. Right bundle branch block chronic problem.  Unchanged, diastolic congestive heart failure.  Doing well from that point review minimal swelling of lower extremities.  Difficulty that we have is his kidney function which now creatinine is 3.05.  Therefore we have limitation terms of medication we can use. Type 2  diabetes which is uncontrolled hemoglobin A1c 12.0 I told him he must better take care of his diabetes otherwise he will face consequences of this problem. History of smoking, I encouraged him to abstain from smoking. Dyslipidemia he is on high intense statin for Lipitor 40.  I did review K PN which show me LDL of 82 HDL 44.  She is getting better if improvement of diabetes control.   Medication Adjustments/Labs and Tests Ordered: Current medicines are reviewed at length with the patient today.  Concerns regarding  medicines are outlined above.  No orders of the defined types were placed in this encounter.  Medication changes: No orders of the defined types were placed in this encounter.   Signed, Park Liter, MD, [facility] 08/18/2021 8:48 AM    [place]

## 2021-08-28 ENCOUNTER — Other Ambulatory Visit: Payer: Self-pay

## 2021-08-28 ENCOUNTER — Ambulatory Visit (HOSPITAL_BASED_OUTPATIENT_CLINIC_OR_DEPARTMENT_OTHER)
Admission: RE | Admit: 2021-08-28 | Discharge: 2021-08-28 | Disposition: A | Discharge status: Home / Self Care | Payer: HMO | Source: Ambulatory Visit | Attending: Cardiology | Admitting: Cardiology

## 2021-08-28 DIAGNOSIS — G4733 Obstructive sleep apnea (adult) (pediatric): Secondary | ICD-10-CM

## 2021-08-28 DIAGNOSIS — R0609 Other forms of dyspnea: Secondary | ICD-10-CM

## 2021-08-28 DIAGNOSIS — Z87891 Personal history of nicotine dependence: Secondary | ICD-10-CM | POA: Diagnosis not present

## 2021-08-28 DIAGNOSIS — I5032 Chronic diastolic (congestive) heart failure: Secondary | ICD-10-CM

## 2021-08-28 DIAGNOSIS — I10 Essential (primary) hypertension: Secondary | ICD-10-CM | POA: Diagnosis not present

## 2021-08-28 DIAGNOSIS — I4510 Unspecified right bundle-branch block: Secondary | ICD-10-CM | POA: Diagnosis not present

## 2021-08-28 DIAGNOSIS — I951 Orthostatic hypotension: Secondary | ICD-10-CM | POA: Diagnosis not present

## 2021-08-28 DIAGNOSIS — E11620 Type 2 diabetes mellitus with diabetic dermatitis: Secondary | ICD-10-CM | POA: Diagnosis not present

## 2021-08-28 LAB — ECHOCARDIOGRAM COMPLETE
AR max vel: 3.11 cm2
AV Area VTI: 3.46 cm2
AV Area mean vel: 3.18 cm2
AV Mean grad: 2 mmHg
AV Peak grad: 4.6 mmHg
Ao pk vel: 1.07 m/s
Area-P 1/2: 4.01 cm2
S' Lateral: 3 cm

## 2021-08-28 NOTE — Progress Notes (Signed)
?  Echocardiogram ?2D Echocardiogram has been performed. ? ?Elmer Ramp ?08/28/2021, 10:35 AM ?

## 2021-08-29 ENCOUNTER — Other Ambulatory Visit: Payer: Self-pay | Admitting: Neurology

## 2021-08-29 ENCOUNTER — Other Ambulatory Visit (HOSPITAL_BASED_OUTPATIENT_CLINIC_OR_DEPARTMENT_OTHER): Payer: HMO

## 2021-08-29 DIAGNOSIS — R4589 Other symptoms and signs involving emotional state: Secondary | ICD-10-CM

## 2021-08-29 MED ORDER — DULOXETINE HCL 30 MG PO CPEP: ORAL_CAPSULE | ORAL | 1 refills | Status: DC

## 2021-09-04 ENCOUNTER — Other Ambulatory Visit: Payer: Self-pay | Admitting: Physician Assistant

## 2021-09-05 ENCOUNTER — Other Ambulatory Visit: Payer: Self-pay | Admitting: Neurology

## 2021-09-05 DIAGNOSIS — K219 Gastro-esophageal reflux disease without esophagitis: Secondary | ICD-10-CM

## 2021-09-05 DIAGNOSIS — I10 Essential (primary) hypertension: Secondary | ICD-10-CM

## 2021-09-05 MED ORDER — PANTOPRAZOLE SODIUM 40 MG PO TBEC: 40.0000 mg | DELAYED_RELEASE_TABLET | Freq: Two times a day (BID) | ORAL | 1 refills | Status: DC

## 2021-09-05 MED ORDER — METOPROLOL TARTRATE 100 MG PO TABS: ORAL_TABLET | ORAL | 1 refills | Status: DC

## 2021-09-25 ENCOUNTER — Telehealth: Payer: Self-pay

## 2021-09-25 NOTE — Telephone Encounter (Signed)
U unfortunately we do not have a list of cheaper options so he will have to call his insurance provider and find out what might be better covered.  The options would be Victoza, Ozempic, or Mounjaro. ?

## 2021-09-25 NOTE — Telephone Encounter (Signed)
Initiated Prior authorization QQP:[id number] 0.'75MG'$ /0.5ML pen-injectors ?Via: [facility] team advantage  ?ZTIW:[id number] ?Status: approved as of 09/25/21 ?Reason:This approval is good through  09/24/21-09/24/22, Please note  this medication is consider a formulary tier three medication  which normally results in coverage with a high co-pay for the pt, this medication is currently $220.18 with  the current pa in place. ?Notified Pt via:pt doe not have Mychart, called to inform them of approval ?

## 2021-10-02 ENCOUNTER — Other Ambulatory Visit: Payer: Self-pay | Admitting: Physician Assistant

## 2021-10-02 DIAGNOSIS — R351 Nocturia: Secondary | ICD-10-CM

## 2021-10-02 DIAGNOSIS — C61 Malignant neoplasm of prostate: Secondary | ICD-10-CM

## 2021-10-02 MED ORDER — TRELEGY ELLIPTA 100-62.5-25 MCG/ACT IN AEPB: 1.0000 | INHALATION_SPRAY | Freq: Every day | RESPIRATORY_TRACT | 1 refills | Status: DC

## 2021-10-02 NOTE — Addendum Note (Signed)
Addended byAnnamaria Helling on: 10/02/2021 03:21 PM ? ? Modules accepted: Orders ? ?

## 2021-10-29 ENCOUNTER — Ambulatory Visit (INDEPENDENT_AMBULATORY_CARE_PROVIDER_SITE_OTHER): Payer: HMO | Admitting: Physician Assistant

## 2021-10-29 ENCOUNTER — Encounter: Payer: Self-pay | Admitting: Physician Assistant

## 2021-10-29 ENCOUNTER — Other Ambulatory Visit: Payer: Self-pay | Admitting: Physician Assistant

## 2021-10-29 VITALS — BP 106/56 | HR 73 | Ht 72.0 in | Wt 314.0 lb

## 2021-10-29 DIAGNOSIS — I951 Orthostatic hypotension: Secondary | ICD-10-CM

## 2021-10-29 DIAGNOSIS — E1122 Type 2 diabetes mellitus with diabetic chronic kidney disease: Secondary | ICD-10-CM | POA: Diagnosis not present

## 2021-10-29 DIAGNOSIS — E1165 Type 2 diabetes mellitus with hyperglycemia: Secondary | ICD-10-CM

## 2021-10-29 DIAGNOSIS — Z794 Long term (current) use of insulin: Secondary | ICD-10-CM | POA: Diagnosis not present

## 2021-10-29 DIAGNOSIS — R195 Other fecal abnormalities: Secondary | ICD-10-CM | POA: Diagnosis not present

## 2021-10-29 DIAGNOSIS — I10 Essential (primary) hypertension: Secondary | ICD-10-CM

## 2021-10-29 DIAGNOSIS — R42 Dizziness and giddiness: Secondary | ICD-10-CM

## 2021-10-29 DIAGNOSIS — N183 Chronic kidney disease, stage 3 unspecified: Secondary | ICD-10-CM | POA: Diagnosis not present

## 2021-10-29 DIAGNOSIS — G4733 Obstructive sleep apnea (adult) (pediatric): Secondary | ICD-10-CM

## 2021-10-29 LAB — POCT GLYCOSYLATED HEMOGLOBIN (HGB A1C): Hemoglobin A1C: 6.6 % — AB (ref 4.0–5.6)

## 2021-10-29 NOTE — Progress Notes (Signed)
Established Patient Office Visit  Subjective   Patient ID: Jeremiah Thomas, male    DOB: April 11, 1956  Age: 66 y.o. MRN: [id number]  Chief Complaint  Patient presents with   Follow-up   Diabetes    HPI Pt is a 66 yo obese male with T2DM, HTN, HLD, OSA, MDD who presents to the clinic for follow up and medication refills.   Pt admits he is not using CPAP but does not know why.   Pt is taking januvia, glipizide, novolin. He was not able to afford trulicity. No open sores or wounds. Not checking his sugars. He does get dizzy when standing but denies any hypoglycemic symptoms.   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   Former smoker 12/19/2019   Benign prostatic hyperplasia without lower urinary tract symptoms 12/19/2019   History of COVID-19 09/20/2019   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, without long-term current use of insulin ([place]) 09/20/2019   Depressed mood 09/20/2019   Pneumonia due to COVID-19 virus 08/25/2019   Chronic respiratory failure with hypoxia ([place]) 08/25/2019   Elevated lipase 08/25/2019   Low TSH level 08/25/2019   Bilateral pulmonary embolism ([place]) 08/25/2019   Lower GI bleed 08/25/2019   Malnutrition of mild degree ([place]) 08/25/2019   Serum albumin decreased 08/25/2019   Anemia 08/25/2019   Orthostatic hypotension 12/07/2018   Paresthesia 09/12/2018   Dizziness 09/12/2018   OSA (obstructive  sleep apnea) 08/22/2018   Non-restorative sleep 06/13/2018   Snoring 06/13/2018   No energy 06/13/2018   Bilateral leg edema 02/21/2018   Craving for particular food 02/21/2018   Newly recognized heart murmur 02/21/2018   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   Type II diabetes mellitus, uncontrolled 09/13/2017   Class 3 severe obesity due to excess calories with serious comorbidity and body mass index (BMI) of 40.0 to 44.9 in adult ([place]) 09/08/2017   Nasal congestion 08/15/2017   Elevated PSA 07/20/2017   Elevated fasting glucose 07/20/2017   Frequent urination at night 05/13/2017   CKD (chronic kidney disease) stage 3, GFR 30-59 ml/min (HCC) 06/03/2016   Acute kidney injury ([place]) 06/02/2016   Essential hypertension, benign 06/02/2016   History of stroke 05/20/2016   Asymptomatic hypertensive urgency 05/20/2016   Past Medical History:  Diagnosis Date   Acute kidney injury ([place]) 06/02/2016   Anemia 08/25/2019   Asymptomatic hypertensive urgency 05/20/2016   Benign prostatic hyperplasia without lower urinary tract symptoms 12/19/2019   Bilateral leg edema 02/21/2018   Bilateral pulmonary embolism ([place]) 08/25/2019   Chronic respiratory failure with hypoxia ([place]) 08/25/2019   CKD (chronic kidney disease) stage 3, GFR 30-59 ml/min ([place]) 06/03/2016   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, without long-term current use of insulin ([place]) 09/20/2019   Craving for particular food 02/21/2018   Depressed mood 09/20/2019   Elevated fasting glucose 07/20/2017   Elevated lipase 08/25/2019   Elevated PSA 07/20/2017   Essential hypertension, benign 06/02/2016   Normal left ventricular size  and systolic function with no appreciable segmental abnormality. Ejection fraction is visually estimated at 82-99% LV diastolic function is mildly abnormal, but there is no evidence of elevated LVEDP or diastolic heart failure. Mild concentric left ventricular hypertrophy. No significant valvular stenosis or  regurgitation. 05/2016   Former smoker 12/19/2019   Frequent urination at night 05/13/2017   History of COVID-19 09/20/2019   History of stroke 05/20/2016   Carotid doppler 05/2016 Mild atherosclerosis less than 39 percent.    Hypertension    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   Lightheaded 09/12/2018   Low TSH level 08/25/2019   Lower GI bleed 08/25/2019   Malnutrition of mild degree ([place]) 08/25/2019   Morbidly obese ([place]) 09/08/2017   Nasal congestion 08/15/2017   Newly recognized heart murmur 02/21/2018   No energy 06/13/2018   Non-restorative sleep 06/13/2018   Orthostatic hypotension 12/07/2018   OSA (obstructive sleep apnea) 08/22/2018   Sleep study 08/2018.   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    Serum albumin decreased 08/25/2019   Snoring 06/13/2018   Type II diabetes mellitus, uncontrolled 09/13/2017   Family History  Problem Relation Age of Onset   Cancer Mother        ovarian   Hypertension Father    Diabetes Father    Stroke Father    No Known Allergies    ROS See HPI.    Objective:     BP (!) 106/56   Pulse 73   Ht 6' (1.829 m)   Wt (!) 314 lb (142.4 kg)   SpO2 97%   BMI 42.59 kg/m  BP Readings from Last 3 Encounters:  10/29/21 (!) 106/56  08/18/21 128/90  08/01/21 113/63   Wt Readings from Last 3 Encounters:  10/29/21 (!) 314 lb (142.4 kg)  08/18/21 (!) 308 lb (139.7 kg)  08/01/21 (!) 312 lb (141.5 kg)      Physical Exam Vitals reviewed.  Constitutional:      Appearance: Normal appearance. He is obese.  HENT:     Head: Normocephalic.  Cardiovascular:     Rate and Rhythm: Regular rhythm.     Heart sounds: Murmur heard.  Pulmonary:     Effort: Pulmonary effort is normal.     Breath sounds: Normal breath sounds.  Musculoskeletal:     Cervical back: Normal range of motion  and neck supple.     Right lower leg: Edema present.     Left lower leg: Edema present.  Neurological:     General: No focal deficit present.     Mental Status: He is alert and oriented to person, place, and time.  Psychiatric:        Mood and Affect: Mood normal.     Results for orders placed or performed in visit on 10/29/21  POCT glycosylated hemoglobin (Hb A1C)  Result Value Ref Range   Hemoglobin A1C 6.6 (A) 4.0 - 5.6 %   HbA1c POC (<> result, manual entry)     HbA1c, POC (prediabetic range)     HbA1c, POC (controlled diabetic range)      The 10-year ASCVD risk score (Arnett DK, et al., 2019) is: 20.7%    Assessment & Plan:  .Marland KitchenJohn was seen today for follow-up and diabetes.  Diagnoses and all orders for this visit:  Controlled type 2 diabetes mellitus with stage 3 chronic  kidney disease, with long-term current use of insulin (HCC) -     POCT glycosylated hemoglobin (Hb A1C)  Essential hypertension, benign  Dizziness  Orthostatic hypotension   A1C is much better today and in normal range I would like to get trulicity covered and off novolin On statin BP low cut norvasc in half to 2.'5mg'$   Make sure to stay hydrated Placed on list for free eye exam through clinic Declined shingrix, pneumonia, colonoscopy Pt is aware of risk Follow up in 3 months.   Strongly urged him to use CPAP machine.  Colonoscopy referral placed again and strongly urged patient to schedule    Iran Planas, PA-C

## 2021-10-30 ENCOUNTER — Encounter: Payer: Self-pay | Admitting: Physician Assistant

## 2021-10-30 MED ORDER — GLIPIZIDE 10 MG PO TABS: 10.0000 mg | ORAL_TABLET | Freq: Two times a day (BID) | ORAL | 0 refills | Status: DC

## 2021-10-30 MED ORDER — TRULICITY 0.75 MG/0.5ML ~~LOC~~ SOAJ: 0.7500 mg | SUBCUTANEOUS | 2 refills | Status: DC

## 2021-12-01 DIAGNOSIS — Z79899 Other long term (current) drug therapy: Secondary | ICD-10-CM | POA: Diagnosis not present

## 2021-12-01 DIAGNOSIS — Z1211 Encounter for screening for malignant neoplasm of colon: Secondary | ICD-10-CM | POA: Diagnosis not present

## 2021-12-01 DIAGNOSIS — R195 Other fecal abnormalities: Secondary | ICD-10-CM | POA: Diagnosis not present

## 2021-12-01 DIAGNOSIS — K5904 Chronic idiopathic constipation: Secondary | ICD-10-CM | POA: Diagnosis not present

## 2021-12-15 ENCOUNTER — Other Ambulatory Visit: Payer: Self-pay | Admitting: Physician Assistant

## 2021-12-15 DIAGNOSIS — E1165 Type 2 diabetes mellitus with hyperglycemia: Secondary | ICD-10-CM

## 2021-12-31 ENCOUNTER — Other Ambulatory Visit: Payer: Self-pay | Admitting: Physician Assistant

## 2021-12-31 DIAGNOSIS — C61 Malignant neoplasm of prostate: Secondary | ICD-10-CM

## 2021-12-31 DIAGNOSIS — R351 Nocturia: Secondary | ICD-10-CM

## 2022-01-08 DIAGNOSIS — K635 Polyp of colon: Secondary | ICD-10-CM | POA: Diagnosis not present

## 2022-01-08 DIAGNOSIS — Z1211 Encounter for screening for malignant neoplasm of colon: Secondary | ICD-10-CM | POA: Diagnosis not present

## 2022-01-08 DIAGNOSIS — E119 Type 2 diabetes mellitus without complications: Secondary | ICD-10-CM | POA: Diagnosis not present

## 2022-01-19 ENCOUNTER — Other Ambulatory Visit: Payer: Self-pay | Admitting: Neurology

## 2022-01-19 ENCOUNTER — Other Ambulatory Visit: Payer: Self-pay | Admitting: Physician Assistant

## 2022-01-19 DIAGNOSIS — Z8673 Personal history of transient ischemic attack (TIA), and cerebral infarction without residual deficits: Secondary | ICD-10-CM

## 2022-01-19 DIAGNOSIS — R600 Localized edema: Secondary | ICD-10-CM

## 2022-01-19 MED ORDER — ATORVASTATIN CALCIUM 40 MG PO TABS: 40.0000 mg | ORAL_TABLET | Freq: Every day | ORAL | 0 refills | Status: DC

## 2022-01-19 MED ORDER — FUROSEMIDE 20 MG PO TABS: 20.0000 mg | ORAL_TABLET | Freq: Two times a day (BID) | ORAL | 0 refills | Status: DC

## 2022-01-19 NOTE — Addendum Note (Signed)
Addended byAnnamaria Helling on: 01/19/2022 01:24 PM   Modules accepted: Orders

## 2022-01-21 ENCOUNTER — Other Ambulatory Visit: Payer: Self-pay | Admitting: Physician Assistant

## 2022-01-21 DIAGNOSIS — I10 Essential (primary) hypertension: Secondary | ICD-10-CM

## 2022-01-27 DIAGNOSIS — K635 Polyp of colon: Secondary | ICD-10-CM | POA: Diagnosis not present

## 2022-01-30 ENCOUNTER — Ambulatory Visit (INDEPENDENT_AMBULATORY_CARE_PROVIDER_SITE_OTHER): Payer: HMO | Admitting: Physician Assistant

## 2022-01-30 ENCOUNTER — Encounter: Payer: Self-pay | Admitting: Physician Assistant

## 2022-01-30 VITALS — BP 134/64 | HR 70 | Ht 72.0 in | Wt 314.0 lb

## 2022-01-30 DIAGNOSIS — R600 Localized edema: Secondary | ICD-10-CM

## 2022-01-30 DIAGNOSIS — N183 Chronic kidney disease, stage 3 unspecified: Secondary | ICD-10-CM

## 2022-01-30 DIAGNOSIS — R0602 Shortness of breath: Secondary | ICD-10-CM

## 2022-01-30 DIAGNOSIS — E1165 Type 2 diabetes mellitus with hyperglycemia: Secondary | ICD-10-CM | POA: Diagnosis not present

## 2022-01-30 DIAGNOSIS — N184 Chronic kidney disease, stage 4 (severe): Secondary | ICD-10-CM

## 2022-01-30 DIAGNOSIS — I10 Essential (primary) hypertension: Secondary | ICD-10-CM

## 2022-01-30 LAB — POCT GLYCOSYLATED HEMOGLOBIN (HGB A1C): Hemoglobin A1C: 8.2 % — AB (ref 4.0–5.6)

## 2022-01-30 NOTE — Progress Notes (Signed)
Established Patient Office Visit  Subjective   Patient ID: Jeremiah Thomas, male    DOB: 1956/02/01  Age: 66 y.o. MRN: [id number]  Chief Complaint  Patient presents with   Diabetes    HPI Pt is a 66 yo obese male with T2DM, HTN, bilateral leg edema, CKD, hx of stroke who presents to the clinic for follow up.   Pt never got trulicity filled. He is not checking his sugars. Not keeping to a diabetic diet. Not exercising. Legs continue to swell. He is short of breath with any exertion  .Marland Kitchen Active Ambulatory Problems    Diagnosis Date Noted   History of stroke 05/20/2016   Asymptomatic hypertensive urgency 05/20/2016   Acute kidney injury ([place]) 06/02/2016   Essential hypertension, benign 06/02/2016   CKD (chronic kidney disease) stage 3, GFR 30-59 ml/min (HCC) 06/03/2016   Frequent urination at night 05/13/2017   Elevated PSA 07/20/2017   Elevated fasting glucose 07/20/2017   Nasal congestion 08/15/2017   Class 3 severe obesity due to excess calories with serious comorbidity and body mass index (BMI) of 40.0 to 44.9 in adult ([place]) 09/08/2017   Type II diabetes mellitus, uncontrolled 09/13/2017   Bilateral leg edema 02/21/2018   Craving for particular food 02/21/2018   Newly recognized heart murmur 02/21/2018   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   Non-restorative sleep 06/13/2018   Snoring 06/13/2018   No energy 06/13/2018   OSA (obstructive sleep apnea) 08/22/2018   Paresthesia 09/12/2018   Dizziness 09/12/2018   Orthostatic hypotension 12/07/2018   Pneumonia due to COVID-19 virus 08/25/2019   Chronic respiratory failure with hypoxia (HCC) 08/25/2019   Elevated lipase 08/25/2019   Low TSH level 08/25/2019   Bilateral pulmonary embolism ([place]) 08/25/2019   Lower GI bleed 08/25/2019   Malnutrition of mild degree ([place]) 08/25/2019   Serum albumin decreased 08/25/2019   Anemia 08/25/2019   History of COVID-19 09/20/2019   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,  without long-term current use of insulin ([place]) 09/20/2019   Depressed mood 09/20/2019   Former smoker 12/19/2019   Benign prostatic hyperplasia without lower urinary tract symptoms 12/19/2019   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   Hypertension    Congestive heart failure (CHF) ([place]) 12/02/2020   CKD (chronic kidney disease) stage 4, GFR 15-29 ml/min (HCC) 01/31/2021   AV block, 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   RBBB (right bundle branch block) 01/31/2021   Tremor 02/03/2021   Chronic diastolic congestive heart failure ([place]) 03/03/2021   Hyperlipidemia associated with type 2 diabetes mellitus ([place]) 04/30/2021   Positive colorectal cancer screening using Cologuard test 08/05/2021   Shortness of breath 01/30/2022   Uncontrolled type 2 diabetes mellitus with hyperglycemia ([place]) 01/30/2022   Resolved Ambulatory Problems    Diagnosis Date Noted   Prostate cancer ([place]) 10/22/2017   Past Medical History:  Diagnosis Date   Lightheaded 09/12/2018   Morbidly obese ([place]) 09/08/2017     ROS See HPI.    Objective:     BP 134/64   Pulse 70   Ht 6' (1.829 m)   Wt (!) 314 lb (142.4 kg)   SpO2 96%   BMI 42.59 kg/m  BP Readings from Last 3 Encounters:  01/30/22 134/64  10/29/21 (!) 106/56  08/18/21 128/90   Wt Readings from Last 3 Encounters:  01/30/22 (!) 314 lb (142.4 kg)  10/29/21 (!) 314 lb (142.4 kg)  08/18/21 (!) 308 lb (139.7 kg)  Physical Exam Constitutional:      Appearance: Normal appearance. He is obese.  HENT:     Head: Normocephalic.  Neck:     Vascular: No carotid bruit.  Cardiovascular:     Rate and Rhythm: Normal rate and regular rhythm.  Pulmonary:     Effort: Pulmonary effort is normal.     Breath sounds: Normal breath sounds.  Musculoskeletal:     Right lower leg: No edema.     Left lower leg: No edema.  Lymphadenopathy:     Cervical: No cervical adenopathy.  Neurological:     General: No focal  deficit present.     Mental Status: He is alert and oriented to person, place, and time.  Psychiatric:        Mood and Affect: Mood normal.      Results for orders placed or performed in visit on 01/30/22  POCT glycosylated hemoglobin (Hb A1C)  Result Value Ref Range   Hemoglobin A1C 8.2 (A) 4.0 - 5.6 %   HbA1c POC (<> result, manual entry)     HbA1c, POC (prediabetic range)     HbA1c, POC (controlled diabetic range)        The 10-year ASCVD risk score (Arnett DK, et al., 2019) is: 31.4%    Assessment & Plan:  .Marland KitchenJohn was seen today for diabetes.  Diagnoses and all orders for this visit:  Uncontrolled type 2 diabetes mellitus with hyperglycemia (HCC) -     POCT glycosylated hemoglobin (Hb A1C) -     COMPLETE METABOLIC PANEL WITH GFR  Essential hypertension, benign -     COMPLETE METABOLIC PANEL WITH GFR  Shortness of breath -     Brain natriuretic peptide  Bilateral leg edema  CKD (chronic kidney disease) stage 4, GFR 15-29 ml/min (HCC)   A1C not to goal Not able to get trulicity Added [place] Continue glipizide and novolin Discussed morning and evening sugar goes and to increase by 2 units every 5 days to get to goal On STATIN BP to goal today Flu shot not in stock Pneumonia vaccine UTD Needs eye exam Follow up in 3 months  Ordered BNP to follow up on SOB. Discussed physical deconditioning and needing to walk daily Wear compression stockings for lower extremity edema   Return in about 3 months (around 05/02/2022).    Iran Planas, PA-C

## 2022-01-30 NOTE — Patient Instructions (Addendum)
Restart Januvia daily.  Make sure taking amlodipine daily.  Sugars at bedtime goal is under 180. Increase novolin by 2 units at night to get to goal.

## 2022-02-18 ENCOUNTER — Other Ambulatory Visit: Payer: Self-pay | Admitting: Physician Assistant

## 2022-02-18 DIAGNOSIS — K219 Gastro-esophageal reflux disease without esophagitis: Secondary | ICD-10-CM

## 2022-02-18 DIAGNOSIS — K648 Other hemorrhoids: Secondary | ICD-10-CM | POA: Diagnosis not present

## 2022-02-18 DIAGNOSIS — D126 Benign neoplasm of colon, unspecified: Secondary | ICD-10-CM | POA: Diagnosis not present

## 2022-02-20 ENCOUNTER — Ambulatory Visit: Payer: HMO | Attending: Cardiology | Admitting: Cardiology

## 2022-02-20 ENCOUNTER — Encounter: Payer: Self-pay | Admitting: Cardiology

## 2022-02-20 VITALS — BP 120/82 | HR 73 | Ht 74.0 in | Wt 312.0 lb

## 2022-02-20 DIAGNOSIS — E11620 Type 2 diabetes mellitus with diabetic dermatitis: Secondary | ICD-10-CM | POA: Diagnosis not present

## 2022-02-20 DIAGNOSIS — I951 Orthostatic hypotension: Secondary | ICD-10-CM | POA: Diagnosis not present

## 2022-02-20 DIAGNOSIS — R600 Localized edema: Secondary | ICD-10-CM | POA: Diagnosis not present

## 2022-02-20 DIAGNOSIS — I10 Essential (primary) hypertension: Secondary | ICD-10-CM

## 2022-02-20 MED ORDER — HYDRALAZINE HCL 25 MG PO TABS: 25.0000 mg | ORAL_TABLET | Freq: Three times a day (TID) | ORAL | 3 refills | Status: DC

## 2022-02-20 NOTE — Progress Notes (Unsigned)
Cardiology Office Note:    Date:  02/20/2022   ID:  Jeremiah Thomas, DOB Apr 17, 1956, MRN [id number]  PCP:  [name], PA-C  Cardiologist:  [name], MD    Referring MD: [name], [place]   Chief Complaint  Patient presents with   Follow-up    Still experiencing dizzinness    History of Present Illness:    Jeremiah Thomas is a 66 y.o. male with past medical history significant for orthostatic hypotension, chronic kidney disease with creatinine level 2.4, essential hypertension, obstructive sleep apnea rarely use CPAP mask, prostate cancer, dyslipidemia, diabetes poorly controlled diabetes however lately he is doing much better.  He was referred to [place] because of orthostatic hypotension.  I discontinued his Flomax things improved but now he comes back he said dizziness is still bothering him anytime he gets up he will get dizzy a few times he fell down never injured himself however.  Denies have any chest pain tightness squeezing pressure burning chest.  He is still staying off of Flomax  Past Medical History:  Diagnosis Date   Acute kidney injury ([place]) 06/02/2016   Anemia 08/25/2019   Asymptomatic hypertensive urgency 05/20/2016   Benign prostatic hyperplasia without lower urinary tract symptoms 12/19/2019   Bilateral leg edema 02/21/2018   Bilateral pulmonary embolism ([place]) 08/25/2019   Chronic respiratory failure with hypoxia ([place]) 08/25/2019   CKD (chronic kidney disease) stage 3, GFR 30-59 ml/min ([place]) 06/03/2016   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, without long-term current use of insulin ([place]) 09/20/2019   Craving for particular food 02/21/2018   Depressed mood 09/20/2019   Elevated fasting glucose 07/20/2017   Elevated lipase 08/25/2019   Elevated PSA 07/20/2017   Essential hypertension, benign 06/02/2016   Normal left ventricular size and systolic function with no appreciable segmental abnormality. Ejection fraction is visually estimated at 15-05% LV  diastolic function is mildly abnormal, but there is no evidence of elevated LVEDP or diastolic heart failure. Mild concentric left ventricular hypertrophy. No significant valvular stenosis or regurgitation. 05/2016   Former smoker 12/19/2019   Frequent urination at night 05/13/2017   History of COVID-19 09/20/2019   History of stroke 05/20/2016   Carotid doppler 05/2016 Mild atherosclerosis less than 39 percent.    Hypertension    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   Lightheaded 09/12/2018   Low TSH level 08/25/2019   Lower GI bleed 08/25/2019   Malnutrition of mild degree ([place]) 08/25/2019   Morbidly obese ([place]) 09/08/2017   Nasal congestion 08/15/2017   Newly recognized heart murmur 02/21/2018   No energy 06/13/2018   Non-restorative sleep 06/13/2018   Orthostatic hypotension 12/07/2018   OSA (obstructive sleep apnea) 08/22/2018   Sleep study 08/2018.   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    Serum albumin decreased 08/25/2019   Snoring 06/13/2018   Type II diabetes mellitus, uncontrolled 09/13/2017    Past Surgical History:  Procedure Laterality Date   VEIN REMOVED Right    Rt leg    Current Medications: Current Meds  Medication Sig   albuterol (VENTOLIN HFA) 108 (90 Base) MCG/ACT inhaler INHALE 2 PUFFS INTO THE LUNGS EVERY 6 HOURS AS NEEDED FOR WHEEZING OR SHORTNESS OF BREATH (Patient taking differently: Inhale 2 puffs into the lungs every 6 (six) hours as needed for wheezing or shortness of breath. INHALE  2 PUFFS INTO THE LUNGS EVERY 6 HOURS AS NEEDED FOR WHEEZING OR SHORTNESS OF BREATH)   AMBULATORY NON FORMULARY MEDICATION Continuous positive airway pressure (CPAP) machine set at autopap from 5-20cmH20, with all supplemental supplies as needed. (Patient taking differently: Inhale 5-20 cm into the lungs as needed (Sleep  aid). Continuous positive airway pressure (CPAP) machine set at autopap from 5-20cmH20, with all supplemental supplies as needed.)   amLODipine (NORVASC) 5 MG tablet TAKE 1 TABLET(5 MG) BY MOUTH DAILY (Patient taking differently: Take 5 mg by mouth daily.)   aspirin EC 81 MG tablet Take 81 mg by mouth daily.   atorvastatin (LIPITOR) 40 MG tablet Take 1 tablet (40 mg total) by mouth daily.   Blood Glucose Monitoring Suppl (MM EASY TOUCH GLUCOSE METER) w/Device KIT 1 Stick by Does not apply route daily. Check fasting blood sugar daily. Dx - DM ICD-10- E11.8   DULoxetine (CYMBALTA) 30 MG capsule TAKE 1 CAPSULE(30 MG) BY MOUTH DAILY (Patient taking differently: Take 30 mg by mouth daily. TAKE 1 CAPSULE(30 MG) BY MOUTH DAILY)   finasteride (PROSCAR) 5 MG tablet TAKE 1 TABLET(5 MG) BY MOUTH DAILY (Patient taking differently: Take 5 mg by mouth daily. TAKE 1 TABLET(5 MG) BY MOUTH DAILY)   fluticasone (FLONASE) 50 MCG/ACT nasal spray USE 2 SPRAYS IN EACH NOSTRIL DAILY (Patient taking differently: Place 1 spray into both nostrils daily.)   Fluticasone-Umeclidin-Vilant (TRELEGY ELLIPTA) 100-62.5-25 MCG/ACT AEPB Inhale 1 puff into the lungs daily.   furosemide (LASIX) 20 MG tablet Take 1 tablet (20 mg total) by mouth 2 (two) times daily.   glipiZIDE (GLUCOTROL) 10 MG tablet Take 1 tablet (10 mg total) by mouth 2 (two) times daily before a meal.   glucose blood test strip Check fasting blood sugar daily. Dx - DM ICD-10- E11.8 (Patient taking differently: 1 each by Other route as needed for other (Glucose reading). Check fasting blood sugar daily. Dx - DM ICD-10- E11.8)   hydrALAZINE (APRESOLINE) 100 MG tablet TAKE 1 TABLET(100 MG) BY MOUTH TWICE DAILY (Patient taking differently: Take 100 mg by mouth 2 (two) times daily.)   insulin NPH Human (NOVOLIN N) 100 UNIT/ML injection Inject 0.1 mLs (10 Units total) into the skin at bedtime.   Insulin Syringe-Needle U-100 (BD VEO INSULIN SYR U/F 1/2UNIT) 31G X 15/64" 0.3 ML  MISC 1 each by Other route daily. USE WITH NOVOLIN INSULIN   JANUVIA 100 MG tablet TAKE 1 TABLET(100 MG) BY MOUTH DAILY (Patient taking differently: Take 100 mg by mouth daily.)   Lancets MISC Check fasting blood sugar daily. Dx - DM ICD-10- E11.8 (Patient taking differently: 1 each by Other route daily at 6 (six) AM. Check fasting blood sugar daily. Dx - DM ICD-10- E11.8)   metoprolol tartrate (LOPRESSOR) 100 MG tablet TAKE 1 TABLET BY MOUTH TWICE DAILY (Patient taking differently: Take 100 mg by mouth 2 (two) times daily. TAKE 1 TABLET BY MOUTH TWICE DAILY)   pantoprazole (PROTONIX) 40 MG tablet TAKE 1 TABLET(40 MG) BY MOUTH TWICE DAILY (Patient taking differently: Take 40 mg by mouth 2 (two) times daily.)   tamsulosin (FLOMAX) 0.4 MG CAPS capsule Take 0.4 mg by mouth daily.     Allergies:   Patient has no known allergies.   Social History   Socioeconomic History   Marital status: Married    Spouse name: Not on file   Number of children: Not on file   Years of education: Not on file   Highest education level: Not on  file  Occupational History   Not on file  Tobacco Use   Smoking status: Former   Smokeless tobacco: Never  Substance and Sexual Activity   Alcohol use: No   Drug use: No   Sexual activity: Not Currently  Other Topics Concern   Not on file  Social History Narrative   Not on file   Social Determinants of Health   Financial Resource Strain: Not on file  Food Insecurity: Not on file  Transportation Needs: Not on file  Physical Activity: Not on file  Stress: Not on file  Social Connections: Not on file     Family History: The patient's family history includes Cancer in his mother; Diabetes in his father; Hypertension in his father; Stroke in his father. ROS:   Please see the history of present illness.    All 14 point review of systems negative except as described per history of present illness  EKGs/Labs/Other Studies Reviewed:      Recent  Labs: 08/01/2021: ALT 19; BUN 24; Creat 3.05; Potassium 4.2; Sodium 139  Recent Lipid Panel    Component Value Date/Time   CHOL 152 08/01/2021 0000   TRIG 162 (H) 08/01/2021 0000   HDL 44 08/01/2021 0000   CHOLHDL 3.5 08/01/2021 0000   LDLCALC 82 08/01/2021 0000    Physical Exam:    VS:  BP 120/82 (BP Location: Left Arm, Patient Position: Sitting)   Pulse 73   Ht 6\' 2"  (1.88 m)   Wt (!) 312 lb (141.5 kg)   SpO2 95%   BMI 40.06 kg/m     Wt Readings from Last 3 Encounters:  02/20/22 (!) 312 lb (141.5 kg)  01/30/22 (!) 314 lb (142.4 kg)  10/29/21 (!) 314 lb (142.4 kg)     GEN:  Well nourished, well developed in no acute distress HEENT: Normal NECK: No JVD; No carotid bruits LYMPHATICS: No lymphadenopathy CARDIAC: RRR, no murmurs, no rubs, no gallops RESPIRATORY:  Clear to auscultation without rales, wheezing or rhonchi  ABDOMEN: Soft, non-tender, non-distended MUSCULOSKELETAL:  No edema; No deformity  SKIN: Warm and dry LOWER EXTREMITIES: no swelling NEUROLOGIC:  Alert and oriented x 3 PSYCHIATRIC:  Normal affect   ASSESSMENT:    1. Orthostatic hypotension   2. Essential hypertension, benign   3. 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, without long-term current use of insulin (HCC)   4. Bilateral leg edema    PLAN:    In order of problems listed above:  Orthostatic hypotension.  I will switch some of his medications.  Look like he takes hydralazine 100 mg twice daily I will switch this to 25 mg twice daily.  Ask him to communicate with me via portal to inform me if his symptoms improved. Essential hypertension blood pressure well controlled now.  Continue present management Diabetes mellitus previously his hemoglobin A1c was very high more than 10 now it is much better latest numbers from K PN I have is from 01/30/2022 8.2.  Clearly improved. Dyslipidemia I did review K PN which show me his LDL 82 HDL 44, will continue present management with  high intense statin   Medication Adjustments/Labs and Tests Ordered: Current medicines are reviewed at length with the patient today.  Concerns regarding medicines are outlined above.  No orders of the defined types were placed in this encounter.  Medication changes: No orders of the defined types were placed in this encounter.   Signed, 02/01/2022, MD, [facility] 02/20/2022 8:52 AM  [facility]

## 2022-02-20 NOTE — Patient Instructions (Addendum)
Medication Instructions:  Your physician has recommended you make the following change in your medication:   CHANGE: Hydralazine to '25mg'$  three(3) times daily by mouth    Lab Work: None Ordered If you have labs (blood work) drawn today and your tests are completely normal, you will receive your results only by: [place] (if you have MyChart) OR A paper copy in the mail If you have any lab test that is abnormal or we need to change your treatment, we will call you to review the results.   Testing/Procedures: None Ordered   Follow-Up: At [facility], you and your health needs are our priority.  As part of our continuing mission to provide you with exceptional heart care, we have created designated Provider Care Teams.  These Care Teams include your primary Cardiologist (physician) and Advanced Practice Providers (APPs -  Physician Assistants and Nurse Practitioners) who all work together to provide you with the care you need, when you need it.  We recommend signing up for the patient portal called "MyChart".  Sign up information is provided on this After Visit Summary.  MyChart is used to connect with patients for Virtual Visits (Telemedicine).  Patients are able to view lab/test results, encounter notes, upcoming appointments, etc.  Non-urgent messages can be sent to your provider as well.   To learn more about what you can do with MyChart, go to [email or web address].    Your next appointment:   3 month(s)  The format for your next appointment:   In Person  Provider:   [name], MD    Other Instructions NA

## 2022-02-28 ENCOUNTER — Other Ambulatory Visit: Payer: Self-pay | Admitting: Physician Assistant

## 2022-02-28 DIAGNOSIS — E1165 Type 2 diabetes mellitus with hyperglycemia: Secondary | ICD-10-CM

## 2022-03-12 IMAGING — CT CT CHEST W/O CM
3 of 4 series · 16 of 36 positions shown, 18 images · non-contrast
Comparison: None.

CLINICAL DATA: Dyspnea, follow-up COVID, chronic shortness of
breath

EXAM:
CT CHEST WITHOUT CONTRAST
TECHNIQUE: Multidetector CT imaging of the chest was performed following the
standard protocol without IV contrast.

[Series 2: thorax · axial · 0.83mm/px · z∈[+822,+1076]mm · 7 of 69 slices shown, 9 images]
[im 9/69  mediastinal]
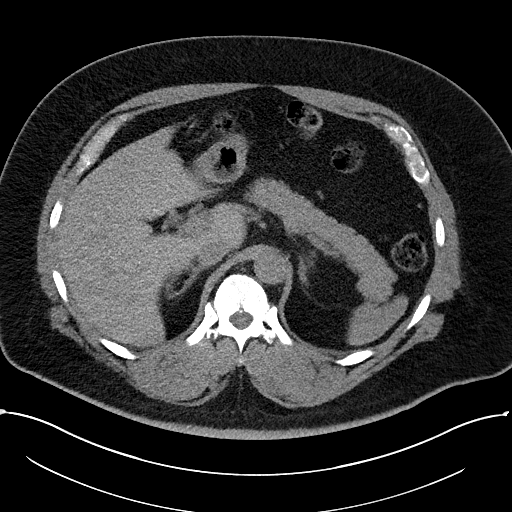
[im 9/69  lung]
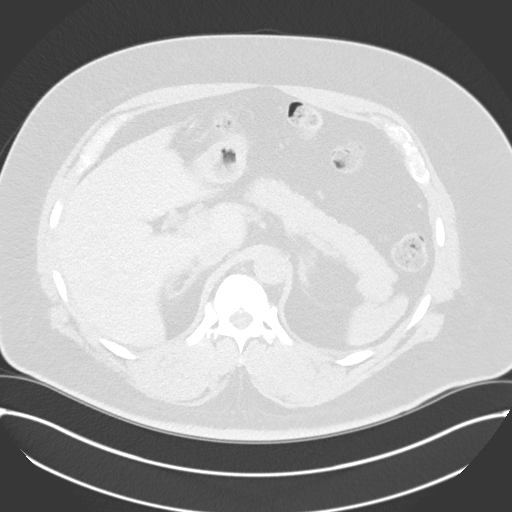
[im 18/69  lung]
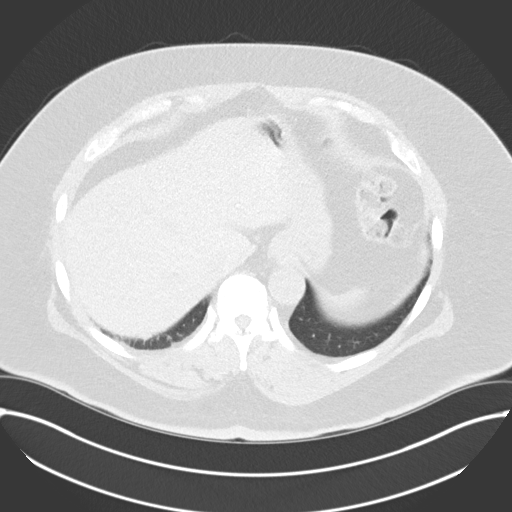
[im 26/69  lung]
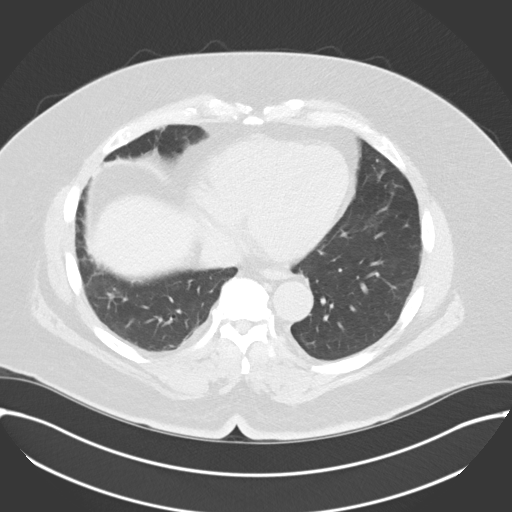
[im 35/69  lung]
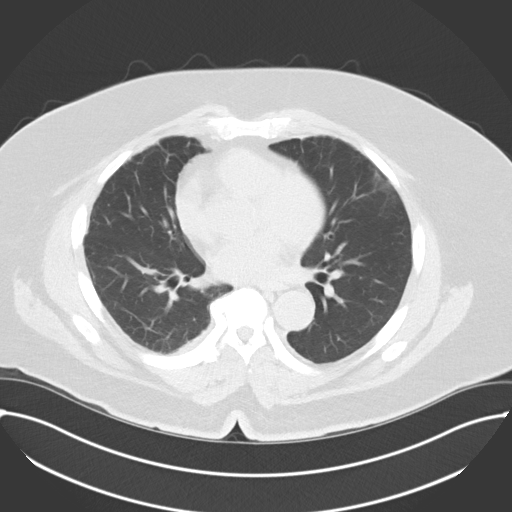
[im 43/69  mediastinal]
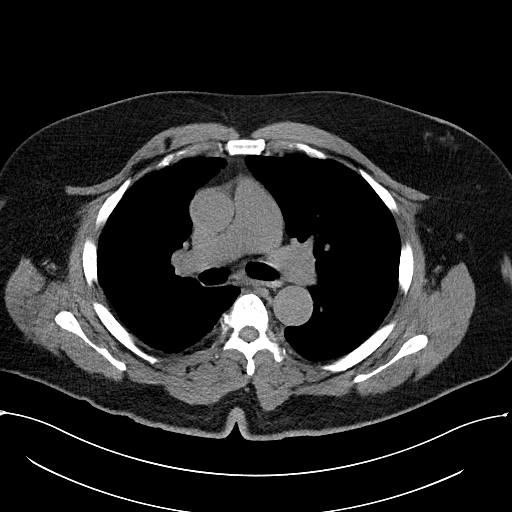
[im 43/69  lung]
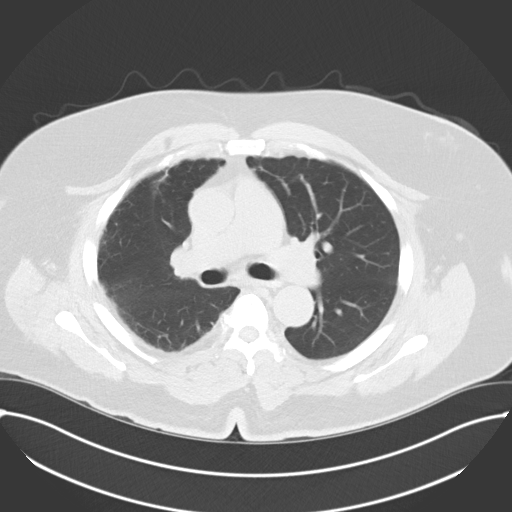
[im 52/69  lung]
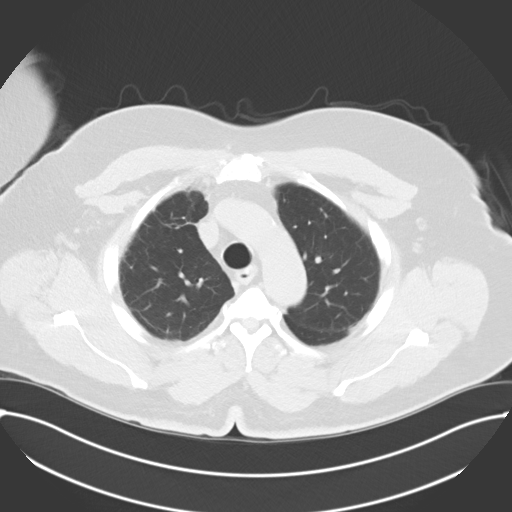
[im 60/69  lung]
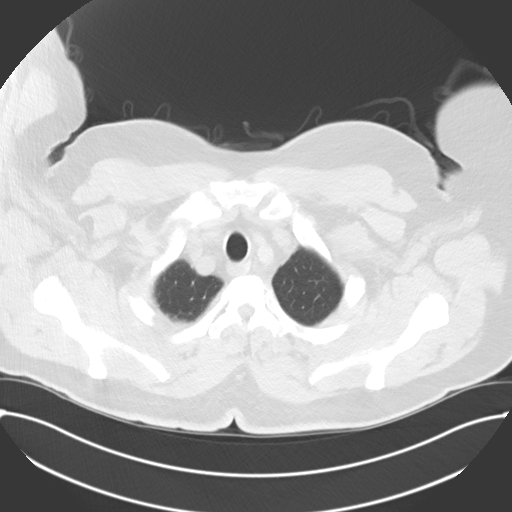

[Series 3: lungs · axial · 0.83mm/px · z∈[+878,+1030]mm · 6 of 138 slices shown]
[im 16/138  lung]
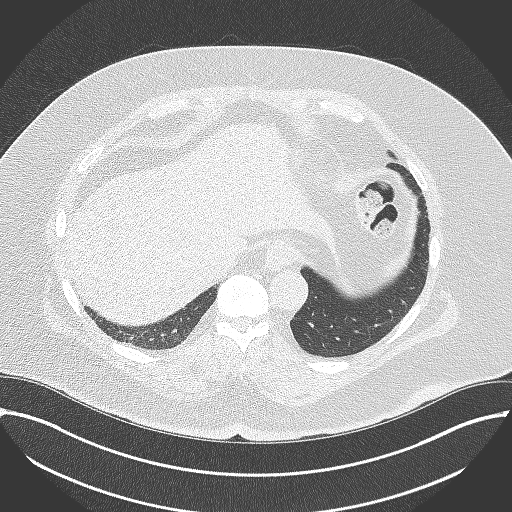
[im 31/138  lung]
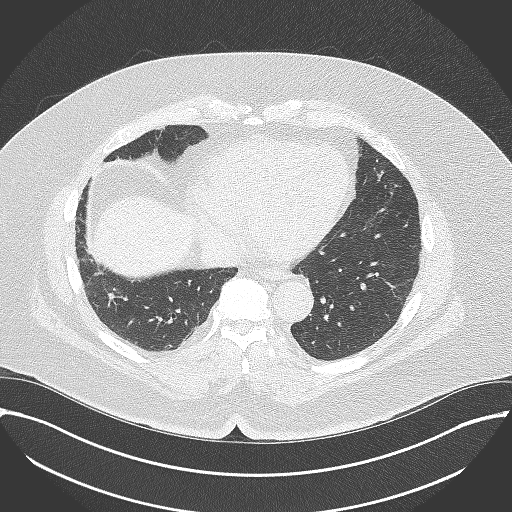
[im 46/138  lung]
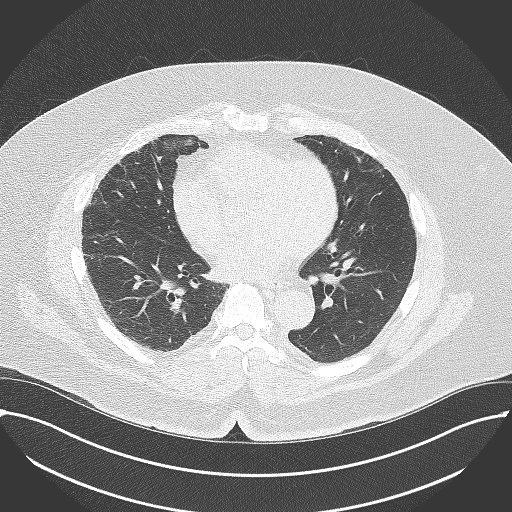
[im 61/138  lung]
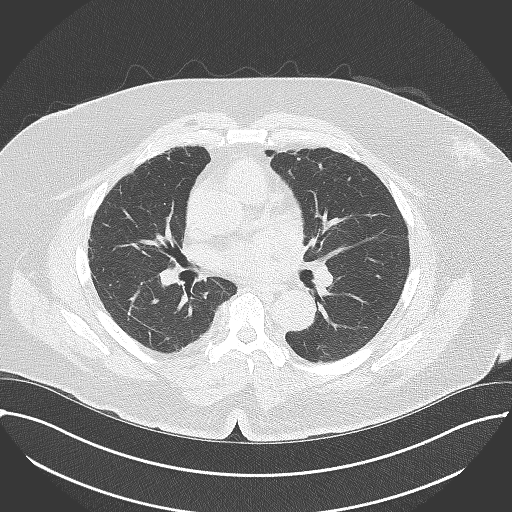
[im 77/138  lung]
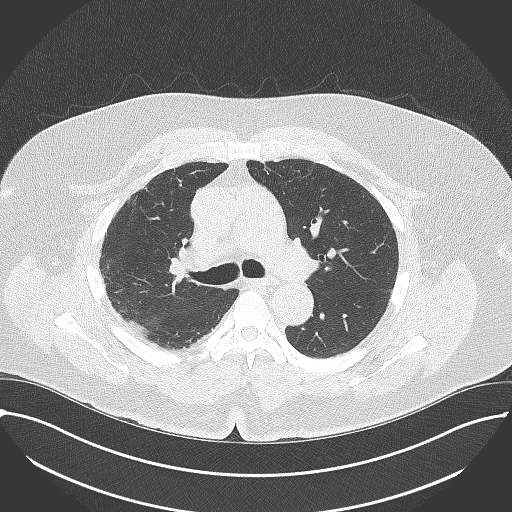
[im 92/138  lung]
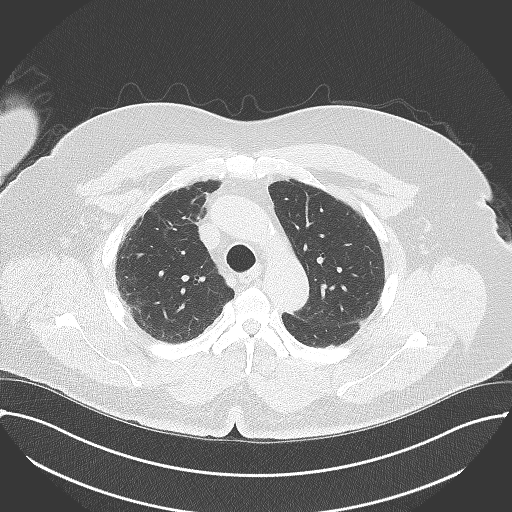

[Series 5: coronal · coronal · 0.70mm/px · 3 of 156 slices shown]
[im 32/156  lung]
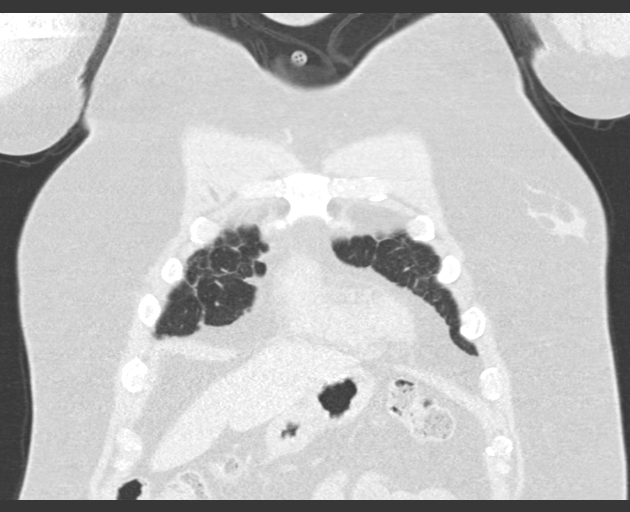
[im 63/156  lung]
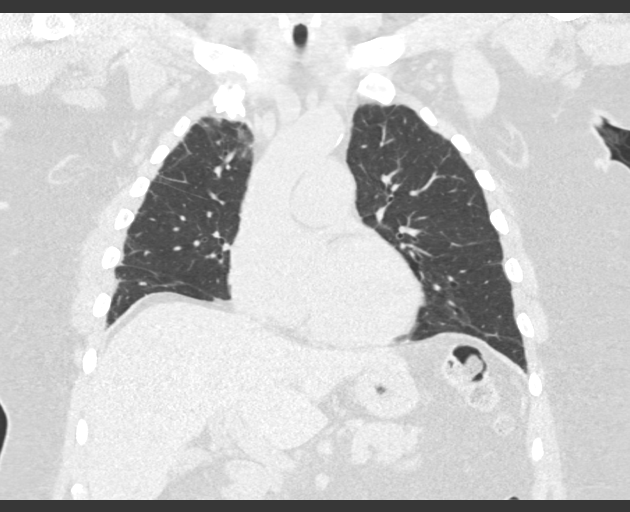
[im 94/156  lung]
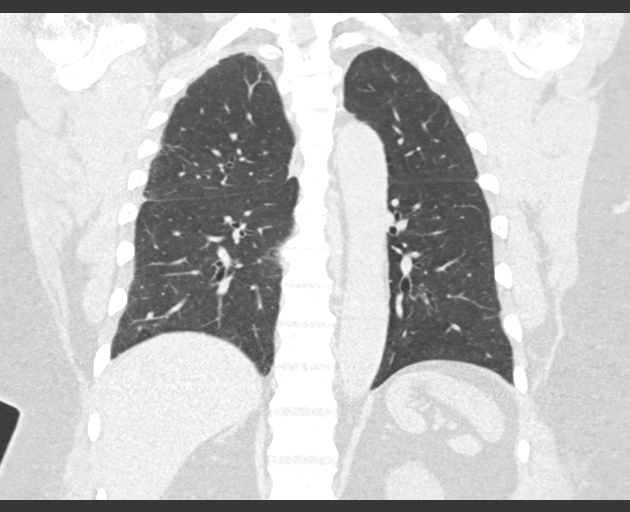

[16 of 36 positions shown; findings below may reference images not displayed]

FINDINGS: Cardiovascular: Scattered aortic atherosclerosis. Normal heart size.
No pericardial effusion.

Mediastinum/Nodes: No enlarged mediastinal, hilar, or axillary lymph
nodes. Thyroid gland, trachea, and esophagus demonstrate no
significant findings.

Lungs/Pleura: There is mild, predominantly peripheral irregular
interstitial opacity without a clear apical to basal gradient and
slightly worse in the right lung (series 3, image 55). Minimal
tubular bronchiectasis. No significant bronchiolectasis, honeycomb,
nodularity, or ground-glass. No pleural effusion or pneumothorax.

Upper Abdomen: No acute abnormality.

Musculoskeletal: No chest wall mass or suspicious bone lesions
identified.
IMPRESSION: 1. There is mild, predominantly peripheral irregular interstitial
opacity without a clear apical to basal gradient and slightly worse
in the right lung. Minimal tubular bronchiectasis. No significant
bronchiolectasis, honeycomb, nodularity, or ground-glass. Findings
are nonspecific and may reflect mild fibrotic interstitial lung
disease, or, particularly given reported history of COVID infection,
post infectious or inflammatory scarring. Comparison to prior
imaging, if available, would be helpful to assess for change over
time. If strictly categorized by ATS pulmonary fibrosis criteria,
findings are in an "indeterminate for UIP" pattern. Consider
follow-up ILD protocol CT in 1 year to assess for stability of
findings and pattern over time.
2. Aortic Atherosclerosis ([id number]).

## 2022-04-12 ENCOUNTER — Other Ambulatory Visit: Payer: Self-pay | Admitting: Physician Assistant

## 2022-04-12 DIAGNOSIS — I10 Essential (primary) hypertension: Secondary | ICD-10-CM

## 2022-04-19 ENCOUNTER — Other Ambulatory Visit: Payer: Self-pay | Admitting: Physician Assistant

## 2022-04-19 DIAGNOSIS — R600 Localized edema: Secondary | ICD-10-CM

## 2022-04-28 ENCOUNTER — Other Ambulatory Visit: Payer: Self-pay | Admitting: Physician Assistant

## 2022-04-28 DIAGNOSIS — E1165 Type 2 diabetes mellitus with hyperglycemia: Secondary | ICD-10-CM

## 2022-05-04 ENCOUNTER — Ambulatory Visit (INDEPENDENT_AMBULATORY_CARE_PROVIDER_SITE_OTHER): Payer: HMO | Admitting: Physician Assistant

## 2022-05-04 ENCOUNTER — Encounter: Payer: Self-pay | Admitting: Physician Assistant

## 2022-05-04 VITALS — BP 149/70 | HR 77

## 2022-05-04 DIAGNOSIS — E1165 Type 2 diabetes mellitus with hyperglycemia: Secondary | ICD-10-CM

## 2022-05-04 DIAGNOSIS — R55 Syncope and collapse: Secondary | ICD-10-CM | POA: Diagnosis not present

## 2022-05-04 DIAGNOSIS — I951 Orthostatic hypotension: Secondary | ICD-10-CM | POA: Diagnosis not present

## 2022-05-04 LAB — POCT GLYCOSYLATED HEMOGLOBIN (HGB A1C): Hemoglobin A1C: 8.7 % — AB (ref 4.0–5.6)

## 2022-05-04 LAB — GLUCOSE, POCT (MANUAL RESULT ENTRY): POC Glucose: 211 mg/dl — AB (ref 70–99)

## 2022-05-04 MED ORDER — GLIPIZIDE 10 MG PO TABS: 10.0000 mg | ORAL_TABLET | Freq: Two times a day (BID) | ORAL | 1 refills | Status: DC

## 2022-05-04 MED ORDER — SITAGLIPTIN PHOSPHATE 100 MG PO TABS: 100.0000 mg | ORAL_TABLET | Freq: Every day | ORAL | 1 refills | Status: DC

## 2022-05-04 NOTE — Progress Notes (Signed)
Established Patient Office Visit  Subjective   Patient ID: Jeremiah Thomas, male    DOB: Dec 04, 1955  Age: 66 y.o. MRN: [id number]  Chief Complaint  Patient presents with   Follow-up   Diabetes    HPI Pt is a 66 yo obese male with T2DM, HTN, HLD, CKD, OSA, CHF who presents to the clinic for follow up.   On arrival today he had a near syncopal episode at check in. No CP, palpitations, headaches or vision changes. He ate breakfast this morning. He just saw cardiology where medications were adjusted.   He is compliant seemingly with diabetic medications. Not checking sugars. No hypoglycemic events. Not really watching sugars or carbs in diet. Intermittent lower extermity swelling. Not wearing compression stockings.   .. Active Ambulatory Problems    Diagnosis Date Noted   History of stroke 05/20/2016   Asymptomatic hypertensive urgency 05/20/2016   Acute kidney injury ([place]) 06/02/2016   Essential hypertension, benign 06/02/2016   CKD (chronic kidney disease) stage 3, GFR 30-59 ml/min (HCC) 06/03/2016   Frequent urination at night 05/13/2017   Elevated PSA 07/20/2017   Elevated fasting glucose 07/20/2017   Nasal congestion 08/15/2017   Class 3 severe obesity due to excess calories with serious comorbidity and body mass index (BMI) of 40.0 to 44.9 in adult ([place]) 09/08/2017   Type II diabetes mellitus, uncontrolled 09/13/2017   Bilateral leg edema 02/21/2018   Craving for particular food 02/21/2018   Newly recognized heart murmur 02/21/2018   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   Non-restorative sleep 06/13/2018   Snoring 06/13/2018   No energy 06/13/2018   OSA (obstructive sleep apnea) 08/22/2018   Paresthesia 09/12/2018   Dizziness 09/12/2018   Orthostatic hypotension 12/07/2018   Pneumonia due to COVID-19 virus 08/25/2019   Chronic respiratory failure with hypoxia ([place]) 08/25/2019   Elevated lipase 08/25/2019   Low TSH level 08/25/2019   Bilateral pulmonary embolism ([place]) 08/25/2019    Lower GI bleed 08/25/2019   Malnutrition of mild degree ([place]) 08/25/2019   Serum albumin decreased 08/25/2019   Anemia 08/25/2019   History of COVID-19 09/20/2019   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, without long-term current use of insulin ([place]) 09/20/2019   Depressed mood 09/20/2019   Former smoker 12/19/2019   Benign prostatic hyperplasia without lower urinary tract symptoms 12/19/2019   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   Hypertension    Congestive heart failure (CHF) ([place]) 12/02/2020   CKD (chronic kidney disease) stage 4, GFR 15-29 ml/min (HCC) 01/31/2021   AV block, 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   RBBB (right bundle branch block) 01/31/2021   Tremor 02/03/2021   Chronic diastolic congestive heart failure ([place]) 03/03/2021   Hyperlipidemia associated with type 2 diabetes mellitus ([place]) 04/30/2021   Positive colorectal cancer screening using Cologuard test 08/05/2021   Shortness of breath 01/30/2022   Uncontrolled type 2 diabetes mellitus with hyperglycemia ([place]) 01/30/2022   Near syncope 05/11/2022   Resolved Ambulatory Problems    Diagnosis Date Noted   Prostate cancer ([place]) 10/22/2017   Past Medical History:  Diagnosis Date   Lightheaded 09/12/2018   Morbidly obese ([place]) 09/08/2017       ROS See HPI. Intermittent dizziness.    Objective:     BP (!) 149/70   Pulse 77   SpO2 98%  BP Readings from Last 3 Encounters:  05/04/22 (!) 149/70  02/20/22 120/82  01/30/22 134/64   Wt Readings from Last 3 Encounters:  02/20/22 Marland Kitchen)  312 lb (141.5 kg)  01/30/22 (!) 314 lb (142.4 kg)  10/29/21 (!) 314 lb (142.4 kg)      Physical Exam Constitutional:      Appearance: Normal appearance. He is obese.  HENT:     Head: Normocephalic.  Neck:     Vascular: No carotid bruit.  Cardiovascular:     Rate and Rhythm: Normal rate.     Pulses: Normal pulses.     Heart sounds: Murmur heard.   Pulmonary:     Effort: Pulmonary effort is normal.     Breath sounds: Normal breath sounds.  Musculoskeletal:     Cervical back: Normal range of motion and neck supple. No tenderness.     Right lower leg: Edema present.     Left lower leg: Edema present.  Lymphadenopathy:     Cervical: No cervical adenopathy.  Neurological:     General: No focal deficit present.     Mental Status: He is alert and oriented to person, place, and time.  Psychiatric:        Mood and Affect: Mood normal.    EKG-No acute changes from previous EKG.   The 10-year ASCVD risk score (Arnett DK, et al., 2019) is: 37%   .Marland Kitchen Results for orders placed or performed in visit on 05/04/22  POCT glycosylated hemoglobin (Hb A1C)  Result Value Ref Range   Hemoglobin A1C 8.7 (A) 4.0 - 5.6 %   HbA1c POC (<> result, manual entry)     HbA1c, POC (prediabetic range)     HbA1c, POC (controlled diabetic range)    POCT Glucose (CBG)  Result Value Ref Range   POC Glucose 211 (A) 70 - 99 mg/dl    Assessment & Plan:  .Marland KitchenJohn was seen today for follow-up and diabetes.  Diagnoses and all orders for this visit:  Uncontrolled type 2 diabetes mellitus with hyperglycemia ([place]) -     POCT glycosylated hemoglobin (Hb A1C) -     glipiZIDE (GLUCOTROL) 10 MG tablet; Take 1 tablet (10 mg total) by mouth 2 (two) times daily before a meal. -     sitaGLIPtin (JANUVIA) 100 MG tablet; Take 1 tablet (100 mg total) by mouth daily.  Near syncope -     POCT Glucose (CBG) -     EKG 12-Lead  Orthostatic hypotension   Known dizziness and being seen by cardiology. Near syncopal episode coming in for visit today. Cardiology recently per notes decreased hydralazine to twice a day but patient seems to still be taking TID and the directions on bottle say TID. Will confirm with cardiology directions.  EKG no acute changes Random glucose at 211 and not hypoglycemic  Discussed A!C not controlled Needs to start increasing novolin by 2 units  every 3 days until fasting sugars under 130.  Variable BP working with cardiology On statin Declines vaccines today Needs eye exam-encouraged to come to free clinic in office here Follow up in 3 months.    Iran Planas, PA-C

## 2022-05-04 NOTE — Patient Instructions (Signed)
20 units of novolin at bedtime  Check goal is 90-130 in the morning. Increase you novolin by 2 units unit you get to goal every 2-3 days.

## 2022-05-11 ENCOUNTER — Telehealth: Payer: Self-pay | Admitting: Neurology

## 2022-05-11 DIAGNOSIS — R55 Syncope and collapse: Secondary | ICD-10-CM | POA: Insufficient documentation

## 2022-05-11 NOTE — Telephone Encounter (Signed)
LVM to call back to discuss.

## 2022-05-11 NOTE — Telephone Encounter (Signed)
-----   Message from [name], [place] sent at 05/11/2022 10:28 AM EST ----- Please call patient and let him know to take bid the '25mg'$  and if the dizziness still occurring we can do '10mg'$  TID.  ----- Message ----- From: Park Liter, MD Sent: 05/11/2022   9:03 AM EST To: [name], PA-C  Should be twice daily or if that is not enough 10 mg 3 times a day of hydralazine that will be not a way ----- Message ----- From: Lavada Mesi Sent: 05/11/2022   7:58 AM EST To: Park Liter, MD  Hey! We have a mutual patient. I wanted to confirm his hydralazine dosage. In notes you said '25mg'$  bid but bottle and how patient is taking is TID. Which one is how you wanted him to take? He had a near syncopal episode here in office at visit.

## 2022-06-17 ENCOUNTER — Ambulatory Visit: Payer: HMO | Attending: Cardiology | Admitting: Cardiology

## 2022-06-17 ENCOUNTER — Encounter: Payer: Self-pay | Admitting: Cardiology

## 2022-06-17 VITALS — BP 152/98 | HR 74 | Ht 74.0 in | Wt 330.0 lb

## 2022-06-17 DIAGNOSIS — G4733 Obstructive sleep apnea (adult) (pediatric): Secondary | ICD-10-CM | POA: Diagnosis not present

## 2022-06-17 DIAGNOSIS — I10 Essential (primary) hypertension: Secondary | ICD-10-CM

## 2022-06-17 DIAGNOSIS — E1169 Type 2 diabetes mellitus with other specified complication: Secondary | ICD-10-CM | POA: Diagnosis not present

## 2022-06-17 DIAGNOSIS — I5032 Chronic diastolic (congestive) heart failure: Secondary | ICD-10-CM | POA: Diagnosis not present

## 2022-06-17 DIAGNOSIS — E11620 Type 2 diabetes mellitus with diabetic dermatitis: Secondary | ICD-10-CM | POA: Diagnosis not present

## 2022-06-17 DIAGNOSIS — I4510 Unspecified right bundle-branch block: Secondary | ICD-10-CM

## 2022-06-17 DIAGNOSIS — E785 Hyperlipidemia, unspecified: Secondary | ICD-10-CM

## 2022-06-17 DIAGNOSIS — E66813 Obesity, class 3: Secondary | ICD-10-CM

## 2022-06-17 DIAGNOSIS — Z6841 Body Mass Index (BMI) 40.0 and over, adult: Secondary | ICD-10-CM

## 2022-06-17 NOTE — Patient Instructions (Addendum)
Medication Instructions:  Your physician recommends that you continue on your current medications as directed. Please refer to the Current Medication list given to you today.  *If you need a refill on your cardiac medications before your next appointment, please call your pharmacy*   Lab Work: Lipid, Ast, ALT, HGB A1C- today [address] If you have labs (blood work) drawn today and your tests are completely normal, you will receive your results only by: [place] (if you have MyChart) OR A paper copy in the mail If you have any lab test that is abnormal or we need to change your treatment, we will call you to review the results.   Testing/Procedures: None Ordered   Follow-Up: At [facility], you and your health needs are our priority.  As part of our continuing mission to provide you with exceptional heart care, we have created designated Provider Care Teams.  These Care Teams include your primary Cardiologist (physician) and Advanced Practice Providers (APPs -  Physician Assistants and Nurse Practitioners) who all work together to provide you with the care you need, when you need it.  We recommend signing up for the patient portal called "MyChart".  Sign up information is provided on this After Visit Summary.  MyChart is used to connect with patients for Virtual Visits (Telemedicine).  Patients are able to view lab/test results, encounter notes, upcoming appointments, etc.  Non-urgent messages can be sent to your provider as well.   To learn more about what you can do with MyChart, go to [email or web address].    Your next appointment:   5 month(s)  The format for your next appointment:   In Person  Provider:   [name], MD    Other Instructions NA

## 2022-06-17 NOTE — Addendum Note (Signed)
Addended by: Jacobo Forest D on: 06/17/2022 08:50 AM   Modules accepted: Orders

## 2022-06-17 NOTE — Progress Notes (Signed)
Cardiology Office Note:    Date:  06/17/2022   ID:  Jeremiah Thomas, DOB 1956-05-08, MRN [id number]  PCP:  [name], PA-C  Cardiologist:  [name], MD    Referring MD: [name], [place]   Chief Complaint  Patient presents with   Follow-up  Doing fine  History of Present Illness:    Jeremiah Thomas is a 67 y.o. male with past medical history significant for orthostatic hypotension, chronic kidney disease with creatinine at the level of 2.4, essential hypertension, obstructive sleep apnea but he admits that he uses CPAP mask very rarely, history of prostate cancer, dyslipidemia, diabetes poorly controlled. Initially he was referred to [place] because of orthostatic hypotension we will discontinue his Flomax which improved we also changed some of his medications.  Better but still having some dizziness upon getting up he symptoms much better does not bother him much and overall he is fine.  He denies have any chest pain, tightness, pressure, burning in the chest.  Past Medical History:  Diagnosis Date   Acute kidney injury ([place]) 06/02/2016   Anemia 08/25/2019   Asymptomatic hypertensive urgency 05/20/2016   Benign prostatic hyperplasia without lower urinary tract symptoms 12/19/2019   Bilateral leg edema 02/21/2018   Bilateral pulmonary embolism ([place]) 08/25/2019   Chronic respiratory failure with hypoxia ([place]) 08/25/2019   CKD (chronic kidney disease) stage 3, GFR 30-59 ml/min ([place]) 06/03/2016   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, without long-term current use of insulin ([place]) 09/20/2019   Craving for particular food 02/21/2018   Depressed mood 09/20/2019   Elevated fasting glucose 07/20/2017   Elevated lipase 08/25/2019   Elevated PSA 07/20/2017   Essential hypertension, benign 06/02/2016   Normal left ventricular size and systolic function with no appreciable segmental abnormality. Ejection fraction is visually estimated at 08-65% LV diastolic function is mildly  abnormal, but there is no evidence of elevated LVEDP or diastolic heart failure. Mild concentric left ventricular hypertrophy. No significant valvular stenosis or regurgitation. 05/2016   Former smoker 12/19/2019   Frequent urination at night 05/13/2017   History of COVID-19 09/20/2019   History of stroke 05/20/2016   Carotid doppler 05/2016 Mild atherosclerosis less than 39 percent.    Hypertension    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   Lightheaded 09/12/2018   Low TSH level 08/25/2019   Lower GI bleed 08/25/2019   Malnutrition of mild degree ([place]) 08/25/2019   Morbidly obese ([place]) 09/08/2017   Nasal congestion 08/15/2017   Newly recognized heart murmur 02/21/2018   No energy 06/13/2018   Non-restorative sleep 06/13/2018   Orthostatic hypotension 12/07/2018   OSA (obstructive sleep apnea) 08/22/2018   Sleep study 08/2018.   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    Serum albumin decreased 08/25/2019   Snoring 06/13/2018   Type II diabetes mellitus, uncontrolled 09/13/2017    Past Surgical History:  Procedure Laterality Date   VEIN REMOVED Right    Rt leg    Current Medications: Current Meds  Medication Sig   albuterol (VENTOLIN HFA) 108 (90 Base) MCG/ACT inhaler INHALE 2 PUFFS INTO THE LUNGS EVERY 6 HOURS AS NEEDED FOR WHEEZING OR SHORTNESS OF BREATH (Patient taking differently: Inhale 2 puffs into the lungs every 6 (six) hours as needed for wheezing or shortness of breath. INHALE 2 PUFFS INTO THE LUNGS EVERY 6  HOURS AS NEEDED FOR WHEEZING OR SHORTNESS OF BREATH)   AMBULATORY NON FORMULARY MEDICATION Continuous positive airway pressure (CPAP) machine set at autopap from 5-20cmH20, with all supplemental supplies as needed. (Patient taking differently: Inhale 5-20 cm into the lungs as needed (Sleep aid). Continuous positive  airway pressure (CPAP) machine set at autopap from 5-20cmH20, with all supplemental supplies as needed.)   amLODipine (NORVASC) 5 MG tablet TAKE 1 TABLET(5 MG) BY MOUTH DAILY (Patient taking differently: Take 5 mg by mouth daily.)   aspirin EC 81 MG tablet Take 81 mg by mouth daily.   atorvastatin (LIPITOR) 40 MG tablet Take 1 tablet (40 mg total) by mouth daily.   Blood Glucose Monitoring Suppl (MM EASY TOUCH GLUCOSE METER) w/Device KIT 1 Stick by Does not apply route daily. Check fasting blood sugar daily. Dx - DM ICD-10- E11.8   DULoxetine (CYMBALTA) 30 MG capsule TAKE 1 CAPSULE(30 MG) BY MOUTH DAILY (Patient taking differently: Take 30 mg by mouth daily. TAKE 1 CAPSULE(30 MG) BY MOUTH DAILY)   finasteride (PROSCAR) 5 MG tablet TAKE 1 TABLET(5 MG) BY MOUTH DAILY (Patient taking differently: Take 5 mg by mouth daily. TAKE 1 TABLET(5 MG) BY MOUTH DAILY)   fluticasone (FLONASE) 50 MCG/ACT nasal spray USE 2 SPRAYS IN EACH NOSTRIL DAILY (Patient taking differently: Place 1 spray into both nostrils daily.)   Fluticasone-Umeclidin-Vilant (TRELEGY ELLIPTA) 100-62.5-25 MCG/ACT AEPB Inhale 1 puff into the lungs daily.   glipiZIDE (GLUCOTROL) 10 MG tablet Take 1 tablet (10 mg total) by mouth 2 (two) times daily before a meal.   glucose blood test strip Check fasting blood sugar daily. Dx - DM ICD-10- E11.8 (Patient taking differently: 1 each by Other route as needed for other (Glucose reading). Check fasting blood sugar daily. Dx - DM ICD-10- E11.8)   hydrALAZINE (APRESOLINE) 25 MG tablet Take 1 tablet (25 mg total) by mouth 3 (three) times daily.   insulin NPH Human (NOVOLIN N) 100 UNIT/ML injection Inject 0.1 mLs (10 Units total) into the skin at bedtime. NEEDS APPT   Insulin Syringe-Needle U-100 (BD VEO INSULIN SYR U/F 1/2UNIT) 31G X 15/64" 0.3 ML MISC 1 each by Other route daily. USE WITH NOVOLIN INSULIN   Lancets MISC Check fasting blood sugar daily. Dx - DM ICD-10- E11.8 (Patient taking differently: 1  each by Other route daily at 6 (six) AM. Check fasting blood sugar daily. Dx - DM ICD-10- E11.8)   metoprolol tartrate (LOPRESSOR) 100 MG tablet TAKE 1 TABLET BY MOUTH TWICE DAILY (Patient taking differently: Take 100 mg by mouth 2 (two) times daily. TAKE 1 TABLET BY MOUTH TWICE DAILY)   pantoprazole (PROTONIX) 40 MG tablet TAKE 1 TABLET(40 MG) BY MOUTH TWICE DAILY (Patient taking differently: Take 40 mg by mouth 2 (two) times daily.)   sitaGLIPtin (JANUVIA) 100 MG tablet Take 1 tablet (100 mg total) by mouth daily.     Allergies:   Patient has no known allergies.   Social History   Socioeconomic History   Marital status: Married    Spouse name: Not on file   Number of children: Not on file   Years of education: Not on file   Highest education level: Not on file  Occupational History   Not on file  Tobacco Use   Smoking status: Former   Smokeless tobacco: Never  Substance and Sexual Activity   Alcohol use: No   Drug use: No   Sexual activity: Not Currently  Other Topics Concern   Not on file  Social History Narrative   Not on file   Social Determinants of Health   Financial Resource Strain: Not on file  Food Insecurity: Not on file  Transportation Needs: Not on file  Physical Activity: Not on file  Stress: Not on file  Social Connections: Not on file     Family History: The patient's family history includes Cancer in his mother; Diabetes in his father; Hypertension in his father; Stroke in his father. ROS:   Please see the history of present illness.    All 14 point review of systems negative except as described per history of present illness  EKGs/Labs/Other Studies Reviewed:      Recent Labs: 08/01/2021: ALT 19; BUN 24; Creat 3.05; Potassium 4.2; Sodium 139  Recent Lipid Panel    Component Value Date/Time   CHOL 152 08/01/2021 0000   TRIG 162 (H) 08/01/2021 0000   HDL 44 08/01/2021 0000   CHOLHDL 3.5 08/01/2021 0000   LDLCALC 82 08/01/2021 0000     Physical Exam:    VS:  BP (!) 152/98 (BP Location: Left Arm, Patient Position: Sitting)   Pulse 74   Ht _0  (1.88 m)   Wt (!) 330 lb (149.7 kg)   SpO2 96%   BMI 42.37 kg/m     Wt Readings from Last 3 Encounters:  06/17/22 (!) 330 lb (149.7 kg)  02/20/22 (!) 312 lb (141.5 kg)  01/30/22 (!) 314 lb (142.4 kg)     GEN:  Well nourished, well developed in no acute distress HEENT: Normal NECK: No JVD; No carotid bruits LYMPHATICS: No lymphadenopathy CARDIAC: RRR, no murmurs, no rubs, no gallops RESPIRATORY:  Clear to auscultation without rales, wheezing or rhonchi  ABDOMEN: Soft, non-tender, non-distended MUSCULOSKELETAL:  No edema; No deformity  SKIN: Warm and dry LOWER EXTREMITIES: no swelling NEUROLOGIC:  Alert and oriented x 3 PSYCHIATRIC:  Normal affect   ASSESSMENT:    1. Essential hypertension, benign   2. Chronic diastolic congestive heart failure ([place])   3. RBBB (right bundle branch block)   4. OSA (obstructive sleep apnea)   5. 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, without long-term current use of insulin ([place])   6. Hyperlipidemia associated with type 2 diabetes mellitus (HCC)   7. Class 3 severe obesity due to excess calories with serious comorbidity and body mass index (BMI) of 40.0 to 44.9 in adult [facility])    PLAN:    In order of problems listed above:  Essential hypertension blood pressure elevated but he said she just took his medication 5 minutes before he came to [place].  Will continue monitoring. Chronic diastolic congestive heart failure.  There is mild swelling of lower extremities but better than before.  He is happy the way if things are not he does not want to make any changes. Obstructive sleep apnea again and stressed importance of taking equipment on the regular basis which should help with him overall wellbeing as well as swelling of lower extremities.  He said he will think it over. Diabetes still poorly controlled.  I see  his hemoglobin A1c from May 04, 2022 which is 8.7.  I told him that is unacceptably high.  I told him that poorly controlled diabetes leads to dehydration which probably contributing to his dizziness and orthostatic hypotension. Dyslipidemia I did review his K PN which show me his LDL 82 HDL 44 this is from February 2023.  Will do fasting lipid profile today   Medication Adjustments/Labs and Tests  Ordered: Current medicines are reviewed at length with the patient today.  Concerns regarding medicines are outlined above.  No orders of the defined types were placed in this encounter.  Medication changes: No orders of the defined types were placed in this encounter.   Signed, Park Liter, MD, [facility] 06/17/2022 8:41 AM    [place]

## 2022-06-18 LAB — LIPID PANEL
Chol/HDL Ratio: 4.9 ratio (ref 0.0–5.0)
Cholesterol, Total: 210 mg/dL — ABNORMAL HIGH (ref 100–199)
HDL: 43 mg/dL (ref >39)
LDL Chol Calc (NIH): 103 mg/dL — ABNORMAL HIGH (ref 0–99)
Triglycerides: 376 mg/dL — ABNORMAL HIGH (ref 0–149)
VLDL Cholesterol Cal: 64 mg/dL — ABNORMAL HIGH (ref 5–40)

## 2022-06-18 LAB — HEMOGLOBIN A1C
Est. average glucose Bld gHb Est-mCnc: 226 mg/dL
Hgb A1c MFr Bld: 9.5 % — ABNORMAL HIGH (ref 4.8–5.6)

## 2022-06-18 LAB — ALT: ALT: 27 IU/L (ref 0–44)

## 2022-06-18 LAB — AST: AST: 15 IU/L (ref 0–40)

## 2022-07-01 ENCOUNTER — Other Ambulatory Visit: Payer: Self-pay | Admitting: Physician Assistant

## 2022-07-01 DIAGNOSIS — K219 Gastro-esophageal reflux disease without esophagitis: Secondary | ICD-10-CM

## 2022-07-01 DIAGNOSIS — R600 Localized edema: Secondary | ICD-10-CM

## 2022-07-02 ENCOUNTER — Other Ambulatory Visit: Payer: Self-pay | Admitting: Physician Assistant

## 2022-07-02 DIAGNOSIS — Z8673 Personal history of transient ischemic attack (TIA), and cerebral infarction without residual deficits: Secondary | ICD-10-CM

## 2022-07-02 DIAGNOSIS — I10 Essential (primary) hypertension: Secondary | ICD-10-CM

## 2022-07-03 ENCOUNTER — Telehealth: Payer: Self-pay

## 2022-07-03 NOTE — Telephone Encounter (Signed)
Unable to reach the patient in multiple times. I mailed a letter requesting a call back

## 2022-07-03 NOTE — Telephone Encounter (Signed)
-----  Message from Park Liter, MD sent at 06/23/2022  2:21 PM EST ----- Cholesterol still not well-controlled.  Please increase Lipitor to 80 mg daily, need to see primary care physician for better diabetes control

## 2022-07-09 ENCOUNTER — Other Ambulatory Visit: Payer: Self-pay | Admitting: Physician Assistant

## 2022-08-04 ENCOUNTER — Ambulatory Visit: Payer: HMO | Admitting: Physician Assistant

## 2022-08-05 ENCOUNTER — Telehealth: Payer: Self-pay | Admitting: Physician Assistant

## 2022-08-05 NOTE — Telephone Encounter (Signed)
Called patient to schedule Medicare Annual Wellness Visit (AWV). Left message for patient to call back and schedule Medicare Annual Wellness Visit (AWV).  Last date of AWV: Never  Please schedule an appointment at any time with Nurse Health Advisor.  If any questions, please contact me at [phone].  Thank you ,  Lin Givens Patient Access Advocate II Direct Dial: [phone]

## 2022-08-06 ENCOUNTER — Telehealth: Payer: Self-pay

## 2022-08-06 ENCOUNTER — Ambulatory Visit (INDEPENDENT_AMBULATORY_CARE_PROVIDER_SITE_OTHER): Payer: HMO | Admitting: Physician Assistant

## 2022-08-06 ENCOUNTER — Other Ambulatory Visit (HOSPITAL_BASED_OUTPATIENT_CLINIC_OR_DEPARTMENT_OTHER): Payer: Self-pay

## 2022-08-06 VITALS — BP 168/71 | HR 78 | Ht 74.0 in | Wt 326.0 lb

## 2022-08-06 DIAGNOSIS — G4733 Obstructive sleep apnea (adult) (pediatric): Secondary | ICD-10-CM | POA: Diagnosis not present

## 2022-08-06 DIAGNOSIS — L602 Onychogryphosis: Secondary | ICD-10-CM | POA: Diagnosis not present

## 2022-08-06 DIAGNOSIS — B351 Tinea unguium: Secondary | ICD-10-CM | POA: Diagnosis not present

## 2022-08-06 DIAGNOSIS — E785 Hyperlipidemia, unspecified: Secondary | ICD-10-CM

## 2022-08-06 DIAGNOSIS — E1169 Type 2 diabetes mellitus with other specified complication: Secondary | ICD-10-CM

## 2022-08-06 DIAGNOSIS — I10 Essential (primary) hypertension: Secondary | ICD-10-CM | POA: Diagnosis not present

## 2022-08-06 DIAGNOSIS — Z1211 Encounter for screening for malignant neoplasm of colon: Secondary | ICD-10-CM | POA: Diagnosis not present

## 2022-08-06 DIAGNOSIS — E1142 Type 2 diabetes mellitus with diabetic polyneuropathy: Secondary | ICD-10-CM

## 2022-08-06 DIAGNOSIS — E1165 Type 2 diabetes mellitus with hyperglycemia: Secondary | ICD-10-CM | POA: Diagnosis not present

## 2022-08-06 MED ORDER — TERBINAFINE HCL 250 MG PO TABS
250.0000 mg | ORAL_TABLET | Freq: Every day | ORAL | 0 refills | Status: DC
  Filled 2022-08-06: qty 90, 90d supply, fill #0

## 2022-08-06 MED ORDER — INSULIN NPH (HUMAN) (ISOPHANE) 100 UNIT/ML ~~LOC~~ SUSP
25.0000 [IU] | Freq: Every day | SUBCUTANEOUS | 0 refills | Status: DC
  Filled 2022-08-06 (×2): qty 10, 40d supply, fill #0
  Filled 2022-11-02: qty 10, 40d supply, fill #1

## 2022-08-06 MED ORDER — MOUNJARO 2.5 MG/0.5ML ~~LOC~~ SOAJ
2.5000 mg | SUBCUTANEOUS | 0 refills | Status: DC
  Filled 2022-08-06: qty 2, 28d supply, fill #0

## 2022-08-06 MED ORDER — ATORVASTATIN CALCIUM 80 MG PO TABS
80.0000 mg | ORAL_TABLET | Freq: Every day | ORAL | 3 refills | Status: DC
  Filled 2022-08-06: qty 90, 90d supply, fill #0
  Filled 2022-11-02: qty 90, 90d supply, fill #1
  Filled 2023-01-28: qty 90, 90d supply, fill #2
  Filled 2023-05-20: qty 90, 90d supply, fill #3

## 2022-08-06 MED ORDER — MOUNJARO 5 MG/0.5ML ~~LOC~~ SOAJ
5.0000 mg | SUBCUTANEOUS | 1 refills | Status: DC
  Filled 2022-08-06 – 2022-09-29 (×2): qty 2, 28d supply, fill #0
  Filled 2022-11-02: qty 2, 28d supply, fill #1

## 2022-08-06 NOTE — Progress Notes (Signed)
Established Patient Office Visit  Subjective   Patient ID: Jeremiah Thomas, male    DOB: [birth year]  Age: 67 y.o. MRN: [id number]  Chief Complaint  Patient presents with   Follow-up   Diabetes    HPI Pt is a 67 yo obese male with T2DM, HTN, HLD, OSA who presents to the clinic for follow up.   He is not checking sugars. He was not able to afford [place]. He did well when on trulicity. Taking 20 units of novolin at bedtime and glipizide during the day. Not watching sugars or exercising. Dizzines has resolved. Deneis any CP, palpitations, headaches or vision changes. He is out of breath with exertion.   .. Active Ambulatory Problems    Diagnosis Date Noted   History of stroke 05/20/2016   Asymptomatic hypertensive urgency 05/20/2016   Acute kidney injury ([place]) 06/02/2016   Essential hypertension, benign 06/02/2016   CKD (chronic kidney disease) stage 3, GFR 30-59 ml/min (HCC) 06/03/2016   Frequent urination at night 05/13/2017   Elevated PSA 07/20/2017   Elevated fasting glucose 07/20/2017   Nasal congestion 08/15/2017   Class 3 severe obesity due to excess calories with serious comorbidity and body mass index (BMI) of 40.0 to 44.9 in adult ([place]) 09/08/2017   Type II diabetes mellitus, uncontrolled 09/13/2017   Bilateral leg edema 02/21/2018   Craving for particular food 02/21/2018   Newly recognized heart murmur 02/21/2018   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   Non-restorative sleep 06/13/2018   Snoring 06/13/2018   No energy 06/13/2018   OSA (obstructive sleep apnea) 08/22/2018   Paresthesia 09/12/2018   Dizziness 09/12/2018   Orthostatic hypotension 12/07/2018   Pneumonia due to COVID-19 virus 08/25/2019   Chronic respiratory failure with hypoxia (HCC) 08/25/2019   Elevated lipase 08/25/2019   Low TSH level 08/25/2019   Bilateral pulmonary embolism ([place]) 08/25/2019   Lower GI bleed 08/25/2019   Malnutrition of mild degree ([place]) 08/25/2019   Serum albumin decreased 08/25/2019    Anemia 08/25/2019   History of COVID-19 09/20/2019   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, without long-term current use of insulin ([place]) 09/20/2019   Depressed mood 09/20/2019   Former smoker 12/19/2019   Benign prostatic hyperplasia without lower urinary tract symptoms 12/19/2019   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   Hypertension    Congestive heart failure (CHF) ([place]) 12/02/2020   CKD (chronic kidney disease) stage 4, GFR 15-29 ml/min (HCC) 01/31/2021   AV block, 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   RBBB (right bundle branch block) 01/31/2021   Tremor 02/03/2021   Chronic diastolic congestive heart failure ([place]) 03/03/2021   Hyperlipidemia associated with type 2 diabetes mellitus ([place]) 04/30/2021   Positive colorectal cancer screening using Cologuard test 08/05/2021   Shortness of breath 01/30/2022   Uncontrolled type 2 diabetes mellitus with hyperglycemia ([place]) 01/30/2022   Near syncope 05/11/2022   Overgrown toenails 08/10/2022   Diabetic peripheral neuropathy associated with type 2 diabetes mellitus ([place]) 08/10/2022   Resolved Ambulatory Problems    Diagnosis Date Noted   Prostate cancer ([place]) 10/22/2017   Past Medical History:  Diagnosis Date   Lightheaded 09/12/2018   Morbidly obese ([place]) 09/08/2017     ROS See HPI.    Objective:     BP (!) 168/71   Pulse 78   Ht '6\' 2"'$  (1.88 m)   Wt (!) 326 lb (147.9 kg)   SpO2 98%   BMI 41.86 kg/m  BP Readings from Last  3 Encounters:  08/06/22 (!) 168/71  06/17/22 (!) 152/98  05/04/22 (!) 149/70   Wt Readings from Last 3 Encounters:  08/06/22 (!) 326 lb (147.9 kg)  06/17/22 (!) 330 lb (149.7 kg)  02/20/22 (!) 312 lb (141.5 kg)   .Marland Kitchen Results for orders placed or performed in visit on 08/06/22  COMPLETE METABOLIC PANEL WITH GFR  Result Value Ref Range   Glucose, Bld 200 (H) 65 - 99 mg/dL   BUN 28 (H) 7 - 25 mg/dL   Creat 2.78 (H) 0.70 - 1.35 mg/dL    eGFR 24 (L) > OR = 60 mL/min/1.47m   BUN/Creatinine Ratio 10 6 - 22 (calc)   Sodium 141 135 - 146 mmol/L   Potassium 4.3 3.5 - 5.3 mmol/L   Chloride 104 98 - 110 mmol/L   CO2 25 20 - 32 mmol/L   Calcium 9.2 8.6 - 10.3 mg/dL   Total Protein 6.9 6.1 - 8.1 g/dL   Albumin 3.7 3.6 - 5.1 g/dL   Globulin 3.2 1.9 - 3.7 g/dL (calc)   AG Ratio 1.2 1.0 - 2.5 (calc)   Total Bilirubin 0.4 0.2 - 1.2 mg/dL   Alkaline phosphatase (APISO) 112 35 - 144 U/L   AST 12 10 - 35 U/L   ALT 15 9 - 46 U/L  Urine Microalbumin w/creat. ratio  Result Value Ref Range   Creatinine, Urine 205 20 - 320 mg/dL   Microalb, Ur 29.2 mg/dL   Microalb Creat Ratio 142 (H) <30 mcg/mg creat     Physical Exam Vitals reviewed.  Constitutional:      Appearance: Normal appearance. He is obese.  HENT:     Head: Normocephalic.  Cardiovascular:     Rate and Rhythm: Normal rate and regular rhythm.     Pulses: Normal pulses.     Heart sounds: Murmur heard.  Pulmonary:     Effort: Pulmonary effort is normal.     Breath sounds: Normal breath sounds.  Musculoskeletal:     Right lower leg: Edema present.     Left lower leg: Edema present.     Comments: Bilateral large scales on legs.   Skin:    Comments: Dystropic thick toenails bilaterally on both feet.   Neurological:     General: No focal deficit present.     Mental Status: He is alert and oriented to person, place, and time.  Psychiatric:        Mood and Affect: Mood normal.     .. Diabetic Foot Exam - Simple   Simple Foot Form Diabetic Foot exam was performed with the following findings: Yes 08/06/2022  9:49 AM  Visual Inspection See comments: Yes Sensation Testing Intact to touch and monofilament testing bilaterally: Yes See comments: Yes Pulse Check Posterior Tibialis and Dorsalis pulse intact bilaterally: Yes Comments No sensation to touch with monofilament on left foot #6, 9 and right #6 Onychomycosis on all toenails       The 10-year ASCVD  risk score (Arnett DK, et al., 2019) is: 47.5%    Assessment & Plan:  ..Marland Kitchenohn was seen today for follow-up and diabetes.  Diagnoses and all orders for this visit:  Uncontrolled type 2 diabetes mellitus with hyperglycemia ([place] -     COMPLETE METABOLIC PANEL WITH GFR -     Urine Microalbumin w/creat. ratio -     tirzepatide (MOUNJARO) 2.5 MG/0.5ML Pen; Inject 2.5 mg into the skin once a week. -     insulin NPH Human (NOVOLIN N)  100 UNIT/ML injection; Inject 0.25 mLs (25 Units total) into the skin at bedtime. -     tirzepatide [facility]) 5 MG/0.5ML Pen; Inject 5 mg into the skin once a week.  Essential hypertension, benign  OSA (obstructive sleep apnea)  Toenail fungus -     terbinafine (LAMISIL) 250 MG tablet; Take 1 tablet (250 mg total) by mouth daily. -     Ambulatory referral to Podiatry  Hyperlipidemia associated with type 2 diabetes mellitus ([place]) -     atorvastatin (LIPITOR) 80 MG tablet; Take 1 tablet (80 mg total) by mouth daily.  Overgrown toenails -     Ambulatory referral to Podiatry  Diabetic peripheral neuropathy associated with type 2 diabetes mellitus ([place])  Colon cancer screening -     Cologuard   A1C not to goal Not able to afford [employer] did change Sent mounjaro to pharmacy and explained after first month to titrate up Continue novolin for now-discussed checking sugars and increasing based on fasting glucose in am. Increased novolin to 25 units Continue glipizide Off metformin due to kidney function Foot exam showed some neuropathy with toe nail fungus Needs podiatry for help cutting nails Started lamisil for fungus On statin, increased lipitor today BP not to goal but history of orthostatic hypotension(pt did not have BP medications this morning)  Please check BP at home Needs eye exam Follow up in 3 months  Declined colonoscopy. He thought he had already had it but we cannot find records. Agreed to cologuard.   Recheck BP in 1 month.    Iran Planas, PA-C

## 2022-08-06 NOTE — Patient Instructions (Addendum)
Will refer to podiatry Added mounjaro, increased lipitor, increased novolin to 25 units at bedtime.  Start lamisil for toenail fungus

## 2022-08-06 NOTE — Telephone Encounter (Addendum)
Initiated Prior authorization [id number] 2.'5MG'$ /0.5ML pen-injectors Via: Covermymeds Case/Key:[id number] Status: approved  as of 08/06/22 Reason:22-FEB-24:22-FEB-25 Notified Pt via: pt does not have Mychart   Initiated Prior authorization [id number] HCl '250MG'$  tablets Via: Covermymeds Case/Key:[id number] Status: denied as of 08/06/22 Reason:When used for a medically accepted indication (approved medical condition), the Plan covers the requested drug at 84 tablets per 180 days. Notified Pt via: pt does not have Mychart

## 2022-08-07 ENCOUNTER — Other Ambulatory Visit (HOSPITAL_BASED_OUTPATIENT_CLINIC_OR_DEPARTMENT_OTHER): Payer: Self-pay

## 2022-08-07 LAB — COMPLETE METABOLIC PANEL WITH GFR
AG Ratio: 1.2 (calc) (ref 1.0–2.5)
ALT: 15 U/L (ref 9–46)
AST: 12 U/L (ref 10–35)
Albumin: 3.7 g/dL (ref 3.6–5.1)
Alkaline phosphatase (APISO): 112 U/L (ref 35–144)
BUN/Creatinine Ratio: 10 (calc) (ref 6–22)
BUN: 28 mg/dL — ABNORMAL HIGH (ref 7–25)
CO2: 25 mmol/L (ref 20–32)
Calcium: 9.2 mg/dL (ref 8.6–10.3)
Chloride: 104 mmol/L (ref 98–110)
Creat: 2.78 mg/dL — ABNORMAL HIGH (ref 0.70–1.35)
Globulin: 3.2 g/dL (calc) (ref 1.9–3.7)
Glucose, Bld: 200 mg/dL — ABNORMAL HIGH (ref 65–99)
Potassium: 4.3 mmol/L (ref 3.5–5.3)
Sodium: 141 mmol/L (ref 135–146)
Total Bilirubin: 0.4 mg/dL (ref 0.2–1.2)
Total Protein: 6.9 g/dL (ref 6.1–8.1)
eGFR: 24 mL/min/{1.73_m2} — ABNORMAL LOW (ref >=60)

## 2022-08-07 LAB — MICROALBUMIN / CREATININE URINE RATIO
Creatinine, Urine: 205 mg/dL (ref 20–320)
Microalb Creat Ratio: 142 mcg/mg creat — ABNORMAL HIGH (ref <30)
Microalb, Ur: 29.2 mg/dL

## 2022-08-07 NOTE — Progress Notes (Signed)
Kidney function at baseline. DRINK water kidneys look dry.

## 2022-08-10 ENCOUNTER — Encounter: Payer: Self-pay | Admitting: Physician Assistant

## 2022-08-10 DIAGNOSIS — L602 Onychogryphosis: Secondary | ICD-10-CM | POA: Insufficient documentation

## 2022-08-10 DIAGNOSIS — E1142 Type 2 diabetes mellitus with diabetic polyneuropathy: Secondary | ICD-10-CM | POA: Insufficient documentation

## 2022-08-19 DIAGNOSIS — K648 Other hemorrhoids: Secondary | ICD-10-CM | POA: Diagnosis not present

## 2022-08-19 DIAGNOSIS — D126 Benign neoplasm of colon, unspecified: Secondary | ICD-10-CM | POA: Diagnosis not present

## 2022-08-19 DIAGNOSIS — Z6841 Body Mass Index (BMI) 40.0 and over, adult: Secondary | ICD-10-CM | POA: Diagnosis not present

## 2022-08-19 DIAGNOSIS — E119 Type 2 diabetes mellitus without complications: Secondary | ICD-10-CM | POA: Diagnosis not present

## 2022-08-21 ENCOUNTER — Ambulatory Visit: Payer: HMO | Admitting: Podiatry

## 2022-08-21 ENCOUNTER — Encounter: Payer: Self-pay | Admitting: Podiatry

## 2022-08-21 DIAGNOSIS — E1142 Type 2 diabetes mellitus with diabetic polyneuropathy: Secondary | ICD-10-CM | POA: Diagnosis not present

## 2022-08-21 DIAGNOSIS — B351 Tinea unguium: Secondary | ICD-10-CM | POA: Diagnosis not present

## 2022-08-21 DIAGNOSIS — M79675 Pain in left toe(s): Secondary | ICD-10-CM

## 2022-08-21 DIAGNOSIS — M79674 Pain in right toe(s): Secondary | ICD-10-CM | POA: Diagnosis not present

## 2022-08-21 NOTE — Progress Notes (Signed)
Subjective:  Patient ID: Jeremiah Thomas, male    DOB: [birth year],   MRN: [id number]  No chief complaint on file.   67 y.o. male presents for concern of thickened elongated and painful nails that are difficult to trim. Requesting to have them trimmed today. Relates burning and tingling in their feet. Patient is diabetic and last A1c was  Lab Results  Component Value Date   HGBA1C 9.5 (H) 06/17/2022   .   PCP:  [name], PA-C    . Denies any other pedal complaints. Denies n/v/f/c.   Past Medical History:  Diagnosis Date   Acute kidney injury ([place]) 06/02/2016   Anemia 08/25/2019   Asymptomatic hypertensive urgency 05/20/2016   Benign prostatic hyperplasia without lower urinary tract symptoms 12/19/2019   Bilateral leg edema 02/21/2018   Bilateral pulmonary embolism ([place]) 08/25/2019   Chronic respiratory failure with hypoxia ([place]) 08/25/2019   CKD (chronic kidney disease) stage 3, GFR 30-59 ml/min ([place]) 06/03/2016   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, without long-term current use of insulin ([place]) 09/20/2019   Craving for particular food 02/21/2018   Depressed mood 09/20/2019   Elevated fasting glucose 07/20/2017   Elevated lipase 08/25/2019   Elevated PSA 07/20/2017   Essential hypertension, benign 06/02/2016   Normal left ventricular size and systolic function with no appreciable segmental abnormality. Ejection fraction is visually estimated at 123456 LV diastolic function is mildly abnormal, but there is no evidence of elevated LVEDP or diastolic heart failure. Mild concentric left ventricular hypertrophy. No significant valvular stenosis or regurgitation. 05/2016   Former smoker 12/19/2019   Frequent urination at night 05/13/2017   History of COVID-19 09/20/2019   History of stroke 05/20/2016   Carotid doppler 05/2016 Mild atherosclerosis less than 39 percent.    Hypertension    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   Lightheaded 09/12/2018   Low TSH level 08/25/2019   Lower GI  bleed 08/25/2019   Malnutrition of mild degree ([place]) 08/25/2019   Morbidly obese ([place]) 09/08/2017   Nasal congestion 08/15/2017   Newly recognized heart murmur 02/21/2018   No energy 06/13/2018   Non-restorative sleep 06/13/2018   Orthostatic hypotension 12/07/2018   OSA (obstructive sleep apnea) 08/22/2018   Sleep study 08/2018.   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    Serum albumin decreased 08/25/2019   Snoring 06/13/2018   Type II diabetes mellitus, uncontrolled 09/13/2017    Objective:  Physical Exam: Vascular: DP/PT pulses 2/4 bilateral. CFT <3 seconds. Absent hair growth on digits. Edema noted to bilateral lower extremities. Xerosis noted bilaterally.  Skin. No lacerations or abrasions bilateral feet. Nails 1-5 bilateral  are thickened discolored and elongated with subungual debris.  Musculoskeletal: MMT 5/5 bilateral lower extremities in DF, PF, Inversion and Eversion. Deceased ROM in DF of ankle joint.  Neurological: Sensation intact to light touch. Protective sensation diminished bilateral.    Assessment:   1. Pain due to onychomycosis of toenails of both feet   2. Diabetic peripheral neuropathy associated with type 2 diabetes mellitus ([place])      Plan:  Patient was evaluated and treated and all questions answered. -Discussed and educated patient on diabetic foot care, especially with  regards to the vascular, neurological and musculoskeletal systems.  -Stressed the importance of good glycemic control and the detriment of not  controlling glucose levels  in relation to the foot. -Discussed supportive shoes at all times and checking feet regularly.  -Mechanically debrided all nails 1-5 bilateral using sterile nail nipper and filed with dremel without incident  -Answered all patient questions -Patient  to return  in 3 months for at risk foot care -Patient advised to call the office if any problems or questions arise in the meantime.   Lorenda Peck, DPM

## 2022-09-10 DIAGNOSIS — K635 Polyp of colon: Secondary | ICD-10-CM | POA: Diagnosis not present

## 2022-09-10 DIAGNOSIS — E119 Type 2 diabetes mellitus without complications: Secondary | ICD-10-CM | POA: Diagnosis not present

## 2022-09-10 DIAGNOSIS — Z1211 Encounter for screening for malignant neoplasm of colon: Secondary | ICD-10-CM | POA: Diagnosis not present

## 2022-09-18 DIAGNOSIS — K635 Polyp of colon: Secondary | ICD-10-CM | POA: Diagnosis not present

## 2022-09-29 ENCOUNTER — Other Ambulatory Visit (HOSPITAL_BASED_OUTPATIENT_CLINIC_OR_DEPARTMENT_OTHER): Payer: Self-pay

## 2022-09-29 ENCOUNTER — Other Ambulatory Visit: Payer: Self-pay | Admitting: Physician Assistant

## 2022-09-29 DIAGNOSIS — Z8673 Personal history of transient ischemic attack (TIA), and cerebral infarction without residual deficits: Secondary | ICD-10-CM

## 2022-10-08 ENCOUNTER — Other Ambulatory Visit: Payer: Self-pay | Admitting: *Deleted

## 2022-10-08 DIAGNOSIS — I10 Essential (primary) hypertension: Secondary | ICD-10-CM

## 2022-10-08 MED ORDER — METOPROLOL TARTRATE 100 MG PO TABS
100.0000 mg | ORAL_TABLET | Freq: Two times a day (BID) | ORAL | 1 refills | Status: DC
Start: 2022-10-08 — End: 2023-02-05

## 2022-10-09 ENCOUNTER — Other Ambulatory Visit (HOSPITAL_BASED_OUTPATIENT_CLINIC_OR_DEPARTMENT_OTHER): Payer: Self-pay

## 2022-10-12 DIAGNOSIS — K648 Other hemorrhoids: Secondary | ICD-10-CM | POA: Diagnosis not present

## 2022-10-12 DIAGNOSIS — D126 Benign neoplasm of colon, unspecified: Secondary | ICD-10-CM | POA: Diagnosis not present

## 2022-10-14 ENCOUNTER — Other Ambulatory Visit: Payer: Self-pay

## 2022-10-14 DIAGNOSIS — R4589 Other symptoms and signs involving emotional state: Secondary | ICD-10-CM

## 2022-10-14 MED ORDER — DULOXETINE HCL 30 MG PO CPEP
ORAL_CAPSULE | ORAL | 1 refills | Status: DC
Start: 2022-10-14 — End: 2023-04-09

## 2022-11-02 ENCOUNTER — Other Ambulatory Visit (HOSPITAL_BASED_OUTPATIENT_CLINIC_OR_DEPARTMENT_OTHER): Payer: Self-pay

## 2022-11-02 ENCOUNTER — Other Ambulatory Visit: Payer: Self-pay | Admitting: Physician Assistant

## 2022-11-02 DIAGNOSIS — B351 Tinea unguium: Secondary | ICD-10-CM

## 2022-11-02 MED ORDER — TERBINAFINE HCL 250 MG PO TABS
250.0000 mg | ORAL_TABLET | Freq: Every day | ORAL | 0 refills | Status: DC
Start: 2022-11-02 — End: 2023-03-05
  Filled 2022-11-02: qty 30, 30d supply, fill #0
  Filled 2022-12-10: qty 30, 30d supply, fill #1
  Filled 2023-01-28: qty 30, 30d supply, fill #2

## 2022-11-04 ENCOUNTER — Other Ambulatory Visit (HOSPITAL_BASED_OUTPATIENT_CLINIC_OR_DEPARTMENT_OTHER): Payer: Self-pay

## 2022-11-04 ENCOUNTER — Ambulatory Visit (INDEPENDENT_AMBULATORY_CARE_PROVIDER_SITE_OTHER): Payer: HMO | Admitting: Physician Assistant

## 2022-11-04 ENCOUNTER — Encounter: Payer: Self-pay | Admitting: Physician Assistant

## 2022-11-04 VITALS — BP 138/74 | HR 84 | Ht 74.0 in | Wt 324.0 lb

## 2022-11-04 DIAGNOSIS — E1122 Type 2 diabetes mellitus with diabetic chronic kidney disease: Secondary | ICD-10-CM | POA: Diagnosis not present

## 2022-11-04 DIAGNOSIS — Z794 Long term (current) use of insulin: Secondary | ICD-10-CM

## 2022-11-04 DIAGNOSIS — R4589 Other symptoms and signs involving emotional state: Secondary | ICD-10-CM | POA: Diagnosis not present

## 2022-11-04 DIAGNOSIS — I10 Essential (primary) hypertension: Secondary | ICD-10-CM

## 2022-11-04 DIAGNOSIS — I5032 Chronic diastolic (congestive) heart failure: Secondary | ICD-10-CM

## 2022-11-04 DIAGNOSIS — J449 Chronic obstructive pulmonary disease, unspecified: Secondary | ICD-10-CM | POA: Diagnosis not present

## 2022-11-04 DIAGNOSIS — N183 Chronic kidney disease, stage 3 unspecified: Secondary | ICD-10-CM | POA: Diagnosis not present

## 2022-11-04 DIAGNOSIS — E1169 Type 2 diabetes mellitus with other specified complication: Secondary | ICD-10-CM

## 2022-11-04 DIAGNOSIS — G4733 Obstructive sleep apnea (adult) (pediatric): Secondary | ICD-10-CM

## 2022-11-04 DIAGNOSIS — E1142 Type 2 diabetes mellitus with diabetic polyneuropathy: Secondary | ICD-10-CM | POA: Diagnosis not present

## 2022-11-04 DIAGNOSIS — E785 Hyperlipidemia, unspecified: Secondary | ICD-10-CM | POA: Diagnosis not present

## 2022-11-04 DIAGNOSIS — E1165 Type 2 diabetes mellitus with hyperglycemia: Secondary | ICD-10-CM

## 2022-11-04 LAB — POCT GLYCOSYLATED HEMOGLOBIN (HGB A1C): Hemoglobin A1C: 6.7 % — AB (ref 4.0–5.6)

## 2022-11-04 MED ORDER — AMLODIPINE BESYLATE 5 MG PO TABS
ORAL_TABLET | ORAL | 1 refills | Status: DC
Start: 2022-11-04 — End: 2023-08-06

## 2022-11-04 MED ORDER — TIRZEPATIDE 7.5 MG/0.5ML ~~LOC~~ SOAJ
7.5000 mg | SUBCUTANEOUS | 0 refills | Status: DC
Start: 2022-11-04 — End: 2023-02-17

## 2022-11-04 MED ORDER — TIRZEPATIDE 7.5 MG/0.5ML ~~LOC~~ SOAJ
7.5000 mg | SUBCUTANEOUS | 0 refills | Status: DC
Start: 2022-11-04 — End: 2023-02-05

## 2022-11-04 MED ORDER — GLIPIZIDE 10 MG PO TABS
10.0000 mg | ORAL_TABLET | Freq: Two times a day (BID) | ORAL | 1 refills | Status: DC
Start: 2022-11-04 — End: 2023-05-05

## 2022-11-04 MED ORDER — TRELEGY ELLIPTA 100-62.5-25 MCG/ACT IN AEPB: 1.0000 | INHALATION_SPRAY | Freq: Every day | RESPIRATORY_TRACT | 1 refills | Status: DC

## 2022-11-04 NOTE — Progress Notes (Signed)
Established Patient Office Visit  Subjective   Patient ID: Jeremiah Thomas, male    DOB: 01/28/1956  Age: 67 y.o. MRN: [id number]  Chief Complaint  Patient presents with   Follow-up    Last A1c was 9.5    HPI Pt is a 67 yo obese male with T2DM, history of CVA, HLD, HTN, OSA, CKD 3, neuropathy who presents to the clinic for 3 month follow up.   Last A1C was 9.5. he has since then started mounjaro. He is checking his sugars in the morning and most of the time under 130. Just taking novolin at bedtime. Feeling pretty good. No CP, palpitaitons, headaches, vision changes. His dizziness is better too. He is trying to be more active. He does not need cane anymore. Using CPAP at night.  Active Ambulatory Problems    Diagnosis Date Noted   History of stroke 05/20/2016   Asymptomatic hypertensive urgency 05/20/2016   Acute kidney injury (HCC) 06/02/2016   Essential hypertension, benign 06/02/2016   CKD (chronic kidney disease) stage 3, GFR 30-59 ml/min (HCC) 06/03/2016   Frequent urination at night 05/13/2017   Elevated PSA 07/20/2017   Elevated fasting glucose 07/20/2017   Nasal congestion 08/15/2017   Class 3 severe obesity due to excess calories with serious comorbidity and body mass index (BMI) of 40.0 to 44.9 in adult (HCC) 09/08/2017   Type II diabetes mellitus, uncontrolled 09/13/2017   Bilateral leg edema 02/21/2018   Craving for particular food 02/21/2018   Newly recognized heart murmur 02/21/2018   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   Non-restorative sleep 06/13/2018   Snoring 06/13/2018   No energy 06/13/2018   OSA (obstructive sleep apnea) 08/22/2018   Paresthesia 09/12/2018   Dizziness 09/12/2018   Orthostatic hypotension 12/07/2018   Pneumonia due to COVID-19 virus 08/25/2019   Chronic respiratory failure with hypoxia (HCC) 08/25/2019   Elevated lipase 08/25/2019   Low TSH level 08/25/2019   Bilateral pulmonary embolism (HCC) 08/25/2019   Lower GI bleed 08/25/2019    Malnutrition of mild degree (HCC) 08/25/2019   Serum albumin decreased 08/25/2019   Anemia 08/25/2019   History of COVID-19 09/20/2019   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, without long-term current use of insulin (HCC) 09/20/2019   Depressed mood 09/20/2019   Former smoker 12/19/2019   Benign prostatic hyperplasia without lower urinary tract symptoms 12/19/2019   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   Hypertension    Congestive heart failure (CHF) (HCC) 12/02/2020   CKD (chronic kidney disease) stage 4, GFR 15-29 ml/min (HCC) 01/31/2021   AV block, 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   RBBB (right bundle branch block) 01/31/2021   Tremor 02/03/2021   Chronic diastolic congestive heart failure (HCC) 03/03/2021   Hyperlipidemia associated with type 2 diabetes mellitus (HCC) 04/30/2021   Positive colorectal cancer screening using Cologuard test 08/05/2021   Shortness of breath 01/30/2022   Uncontrolled type 2 diabetes mellitus with hyperglycemia (HCC) 01/30/2022   Near syncope 05/11/2022   Overgrown toenails 08/10/2022   Diabetic peripheral neuropathy associated with type 2 diabetes mellitus (HCC) 08/10/2022   Chronic obstructive pulmonary disease (HCC) 11/12/2022   Resolved Ambulatory Problems    Diagnosis Date Noted   Prostate cancer (HCC) 10/22/2017   Past Medical History:  Diagnosis Date   Lightheaded 09/12/2018   Morbidly obese (HCC) 09/08/2017     ROS See HPI.    Objective:     BP 138/74   Pulse 84   Ht 6'  2" (1.88 m)   Wt (!) 324 lb (147 kg)   SpO2 99%   BMI 41.60 kg/m  BP Readings from Last 3 Encounters:  11/04/22 138/74  08/06/22 (!) 168/71  06/17/22 (!) 152/98   Wt Readings from Last 3 Encounters:  11/04/22 (!) 324 lb (147 kg)  08/06/22 (!) 326 lb (147.9 kg)  06/17/22 (!) 330 lb (149.7 kg)    .Marland Kitchen Results for orders placed or performed in visit on 11/04/22  POCT HgB A1C  Result Value Ref  Range   Hemoglobin A1C 6.7 (A) 4.0 - 5.6 %   HbA1c POC (<> result, manual entry)     HbA1c, POC (prediabetic range)     HbA1c, POC (controlled diabetic range)       Physical Exam Constitutional:      Appearance: Normal appearance. He is obese.  HENT:     Head: Normocephalic.  Neck:     Vascular: No carotid bruit.  Cardiovascular:     Rate and Rhythm: Normal rate and regular rhythm.     Pulses: Normal pulses.     Heart sounds: Murmur heard.  Pulmonary:     Effort: Pulmonary effort is normal.     Breath sounds: Normal breath sounds.  Musculoskeletal:     Cervical back: Normal range of motion and neck supple. No tenderness.     Right lower leg: Edema present.     Left lower leg: Edema present.  Lymphadenopathy:     Cervical: No cervical adenopathy.  Neurological:     General: No focal deficit present.     Mental Status: He is alert and oriented to person, place, and time.  Psychiatric:        Mood and Affect: Mood normal.      The 10-year ASCVD risk score (Arnett DK, et al., 2019) is: 35.7%    Assessment & Plan:  .Marland KitchenJohn was seen today for follow-up.  Diagnoses and all orders for this visit:  Controlled type 2 diabetes mellitus with stage 3 chronic kidney disease, with long-term current use of insulin (HCC) -     POCT HgB A1C -     tirzepatide (MOUNJARO) 7.5 MG/0.5ML Pen; Inject 7.5 mg into the skin once a week. -     glipiZIDE (GLUCOTROL) 10 MG tablet; Take 1 tablet (10 mg total) by mouth 2 (two) times daily before a meal. -     tirzepatide (MOUNJARO) 7.5 MG/0.5ML Pen; Inject 7.5 mg into the skin once a week.  Essential hypertension, benign -     amLODipine (NORVASC) 5 MG tablet; TAKE 1 TABLET(5 MG) BY MOUTH DAILY  OSA (obstructive sleep apnea)  Depressed mood  Diabetic peripheral neuropathy associated with type 2 diabetes mellitus (HCC)  Hyperlipidemia associated with type 2 diabetes mellitus (HCC)  Chronic diastolic congestive heart failure  (HCC)  Chronic obstructive pulmonary disease, unspecified COPD type (HCC) -     Fluticasone-Umeclidin-Vilant (TRELEGY ELLIPTA) 100-62.5-25 MCG/ACT AEPB; Inhale 1 puff into the lungs daily.   A1C so much better BP to goal Continue same medications Sent refills and increased mounjaro to 7.5mg , I would like to se patient get off novolin. Discussed if sugars under 100 in the morning to decrease night time insulin by 2 units.  Needs eye exam.  .. Diabetic Foot Exam - Simple   Simple Foot Form Diabetic Foot exam was performed with the following findings: Yes 11/04/2022 12:30 PM  Visual Inspection No deformities, no ulcerations, no other skin breakdown bilaterally: Yes Sensation Testing Intact  to touch and monofilament testing bilaterally: Yes Pulse Check Posterior Tibialis and Dorsalis pulse intact bilaterally: Yes Comments    Vaccines UTD.  Follow up in 3 months  Return in about 3 months (around 02/04/2023).    Tandy Gaw, PA-C

## 2022-11-12 ENCOUNTER — Encounter: Payer: Self-pay | Admitting: Physician Assistant

## 2022-11-12 DIAGNOSIS — J449 Chronic obstructive pulmonary disease, unspecified: Secondary | ICD-10-CM | POA: Insufficient documentation

## 2022-11-20 ENCOUNTER — Ambulatory Visit (INDEPENDENT_AMBULATORY_CARE_PROVIDER_SITE_OTHER): Payer: PPO | Admitting: Podiatry

## 2022-11-20 DIAGNOSIS — Z91199 Patient's noncompliance with other medical treatment and regimen due to unspecified reason: Secondary | ICD-10-CM

## 2022-11-20 NOTE — Progress Notes (Signed)
No show

## 2022-12-01 ENCOUNTER — Ambulatory Visit: Payer: HMO | Attending: Cardiology | Admitting: Cardiology

## 2022-12-01 ENCOUNTER — Encounter: Payer: Self-pay | Admitting: Cardiology

## 2022-12-01 VITALS — BP 150/90 | HR 84 | Ht 74.0 in | Wt 326.0 lb

## 2022-12-01 DIAGNOSIS — Z7984 Long term (current) use of oral hypoglycemic drugs: Secondary | ICD-10-CM

## 2022-12-01 DIAGNOSIS — E11620 Type 2 diabetes mellitus with diabetic dermatitis: Secondary | ICD-10-CM

## 2022-12-01 DIAGNOSIS — I10 Essential (primary) hypertension: Secondary | ICD-10-CM

## 2022-12-01 DIAGNOSIS — R0609 Other forms of dyspnea: Secondary | ICD-10-CM

## 2022-12-01 DIAGNOSIS — I5032 Chronic diastolic (congestive) heart failure: Secondary | ICD-10-CM | POA: Diagnosis not present

## 2022-12-01 DIAGNOSIS — I951 Orthostatic hypotension: Secondary | ICD-10-CM

## 2022-12-01 NOTE — Progress Notes (Signed)
Cardiology Office Note:    Date:  12/01/2022   ID:  Jeremiah Thomas, DOB [birth year], MRN [id number]  PCP:  Jomarie Longs, PA-C  Cardiologist:  Gypsy Balsam, MD    Referring MD: Jomarie Longs, [place]   Chief Complaint  Patient presents with   Follow-up  Doing fine still some dizziness  History of Present Illness:    Jeremiah Thomas is a 67 y.o. male past medical history significant for orthostatic hypotension, chronic kidney disease with creatinine neighborhood of 2.7 now, obstructive sleep apnea but he admits that he does not use CPAP mask too often, history of prostate cancer, dyslipidemia, diabetes previously poorly controlled but now much better with hemoglobin A1c from May 22 of 6.7.  I been following him for orthostatic hypotension.  He seems to be in quite well symptoms get dizzy when getting up very quickly but not to the point of passing out or falling.  Interestingly described that he does not do much she sits in the chair all the time.  He tried to walk on the regular basis but get tired very easily short of breath as well as sometimes chest sensation.  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 Ejection fraction is visually estimated at 60-65% LV diastolic function is mildly abnormal, but there is no evidence of elevated LVEDP or diastolic heart failure. Mild concentric left ventricular hypertrophy. No significant valvular stenosis or regurgitation. 05/2016   Former smoker 12/19/2019   Frequent urination at night 05/13/2017   History of COVID-19 09/20/2019   History of stroke 05/20/2016   Carotid doppler 05/2016 Mild atherosclerosis less than 39 percent.    Hypertension    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   Lightheaded 09/12/2018   Low TSH level 08/25/2019   Lower GI bleed 08/25/2019   Malnutrition of mild degree (HCC) 08/25/2019   Morbidly obese (HCC) 09/08/2017   Nasal congestion 08/15/2017   Newly recognized heart murmur 02/21/2018   No energy 06/13/2018   Non-restorative sleep 06/13/2018   Orthostatic hypotension 12/07/2018   OSA (obstructive sleep apnea) 08/22/2018   Sleep study 08/2018.   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    Serum albumin decreased 08/25/2019   Snoring 06/13/2018   Type II diabetes mellitus, uncontrolled 09/13/2017    Past Surgical History:  Procedure Laterality Date   VEIN REMOVED Right    Rt leg    Current Medications: Current Meds  Medication Sig   albuterol (VENTOLIN HFA) 108 (90 Base) MCG/ACT inhaler INHALE 2 PUFFS INTO THE LUNGS EVERY 6 HOURS AS NEEDED FOR WHEEZING OR SHORTNESS OF BREATH (Patient taking  differently: Inhale 2 puffs into the lungs every 6 (six) hours as needed for wheezing or shortness of breath. INHALE 2 PUFFS INTO THE LUNGS EVERY 6 HOURS AS NEEDED FOR WHEEZING OR SHORTNESS OF BREATH)   AMBULATORY NON FORMULARY MEDICATION Continuous positive airway pressure (CPAP) machine set at autopap from 5-20cmH20, with all supplemental supplies as needed. (Patient  taking differently: Inhale 5-20 cm into the lungs as needed (Sleep aid). Continuous positive airway pressure (CPAP) machine set at autopap from 5-20cmH20, with all supplemental supplies as needed.)   amLODipine (NORVASC) 5 MG tablet TAKE 1 TABLET(5 MG) BY MOUTH DAILY (Patient taking differently: Take 5 mg by mouth daily. TAKE 1 TABLET(5 MG) BY MOUTH DAILY)   aspirin EC 81 MG tablet Take 81 mg by mouth daily.   atorvastatin (LIPITOR) 80 MG tablet Take 1 tablet (80 mg total) by mouth daily.   BD VEO INSULIN SYR U/F 1/2UNIT 31G X 15/64" 0.3 ML MISC USE DAILY WITH NOVOLIN INSULIN (Patient taking differently: 1 each by Other route daily.)   Blood Glucose Monitoring Suppl (MM EASY TOUCH GLUCOSE METER) w/Device KIT 1 Stick by Does not apply route daily. Check fasting blood sugar daily. Dx - DM ICD-10- E11.8   DULoxetine (CYMBALTA) 30 MG capsule TAKE 1 CAPSULE(30 MG) BY MOUTH DAILY (Patient taking differently: Take 30 mg by mouth daily. TAKE 1 CAPSULE(30 MG) BY MOUTH DAILY)   finasteride (PROSCAR) 5 MG tablet TAKE 1 TABLET(5 MG) BY MOUTH DAILY (Patient taking differently: Take 5 mg by mouth daily. TAKE 1 TABLET(5 MG) BY MOUTH DAILY)   fluticasone (FLONASE) 50 MCG/ACT nasal spray USE 2 SPRAYS IN EACH NOSTRIL DAILY (Patient taking differently: Place 1 spray into both nostrils daily.)   Fluticasone-Umeclidin-Vilant (TRELEGY ELLIPTA) 100-62.5-25 MCG/ACT AEPB Inhale 1 puff into the lungs daily.   glipiZIDE (GLUCOTROL) 10 MG tablet Take 1 tablet (10 mg total) by mouth 2 (two) times daily before a meal.   glucose blood test strip Check fasting blood sugar daily. Dx - DM ICD-10- E11.8 (Patient taking differently: 1 each by Other route as needed for other (Glucose reading). Check fasting blood sugar daily. Dx - DM ICD-10- E11.8)   hydrALAZINE (APRESOLINE) 25 MG tablet Take 1 tablet (25 mg total) by mouth 3 (three) times daily.   insulin NPH Human (NOVOLIN N) 100 UNIT/ML injection Inject 0.25 mLs (25 Units total) into  the skin at bedtime.   Lancets MISC Check fasting blood sugar daily. Dx - DM ICD-10- E11.8 (Patient taking differently: 1 each by Other route daily at 6 (six) AM. Check fasting blood sugar daily. Dx - DM ICD-10- E11.8)   metoprolol tartrate (LOPRESSOR) 100 MG tablet Take 1 tablet (100 mg total) by mouth 2 (two) times daily.   pantoprazole (PROTONIX) 40 MG tablet TAKE 1 TABLET(40 MG) BY MOUTH TWICE DAILY (Patient taking differently: Take 40 mg by mouth 2 (two) times daily.)   terbinafine (LAMISIL) 250 MG tablet Take 1 tablet (250 mg total) by mouth daily.   tirzepatide (MOUNJARO) 7.5 MG/0.5ML Pen Inject 7.5 mg into the skin once a week.   tirzepatide (MOUNJARO) 7.5 MG/0.5ML Pen Inject 7.5 mg into the skin once a week.     Allergies:   Patient has no known allergies.   Social History   Socioeconomic History   Marital status: Married    Spouse name: Not on file   Number of children: Not on file   Years of education: Not on file   Highest education level: Not on file  Occupational History   Not on file  Tobacco Use   Smoking status: Former   Smokeless tobacco: Never  Substance and Sexual Activity   Alcohol use: No   Drug use: No   Sexual activity: Not Currently  Other Topics Concern   Not on file  Social History Narrative   Not on file   Social Determinants of Health   Financial Resource Strain: Not on file  Food Insecurity: Not on file  Transportation Needs: Not on file  Physical Activity: Not on file  Stress: Not on file  Social Connections: Not on file     Family History: The patient's family history includes Cancer in his mother; Diabetes in his father; Hypertension in his father; Stroke in his father. ROS:   Please see the history of present illness.    All 14 point review of systems negative except as described per history of present illness  EKGs/Labs/Other Studies Reviewed:      Recent Labs: 08/06/2022: ALT 15; BUN 28; Creat 2.78; Potassium 4.3; Sodium 141   Recent Lipid Panel    Component Value Date/Time   CHOL 210 (H) 06/17/2022 0914   TRIG 376 (H) 06/17/2022 0914   HDL 43 06/17/2022 0914   CHOLHDL 4.9 06/17/2022 0914   CHOLHDL 3.5 08/01/2021 0000   LDLCALC 103 (H) 06/17/2022 0914   LDLCALC 82 08/01/2021 0000    Physical Exam:    VS:  BP (!) 150/90 (BP Location: Left Arm, Patient Position: Sitting)   Pulse 84   Ht 6\' 2"  (1.88 m)   Wt (!) 326 lb (147.9 kg)   SpO2 94%   BMI 41.86 kg/m     Wt Readings from Last 3 Encounters:  12/01/22 (!) 326 lb (147.9 kg)  11/04/22 (!) 324 lb (147 kg)  08/06/22 (!) 326 lb (147.9 kg)     GEN:  Well nourished, well developed in no acute distress HEENT: Normal NECK: No JVD; No carotid bruits LYMPHATICS: No lymphadenopathy CARDIAC: RRR, no murmurs, no rubs, no gallops RESPIRATORY:  Clear to auscultation without rales, wheezing or rhonchi  ABDOMEN: Soft, non-tender, non-distended MUSCULOSKELETAL:  No edema; No deformity  SKIN: Warm and dry LOWER EXTREMITIES: no swelling NEUROLOGIC:  Alert and oriented x 3 PSYCHIATRIC:  Normal affect   ASSESSMENT:    1. Orthostatic hypotension   2. Essential hypertension, benign   3. Chronic diastolic congestive heart failure (HCC)   4. 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  Will continue present management. Essential hypertension blood pressure elevated but in discussion as above. Chronic diastolic congestive heart failure does have some swelling of lower extremities but overall I am afraid to give him more diuretic because of kidney dysfunction. Dyspnea on exertion, I will schedule him to have stress test to rule out ischemia he does have multiple  risk factor for it does not have typical symptoms but with his risk factors profile we need to rule it out. Dyslipidemia did review his K PN which show me data from June 17, 2022 with LDL of 103 HDL 43.  Will schedule him to have fasting lipid profile   Medication Adjustments/Labs and Tests Ordered: Current medicines are reviewed at length with  the patient today.  Concerns regarding medicines are outlined above.  No orders of the defined types were placed in this encounter.  Medication changes: No orders of the defined types were placed in this encounter.   Signed, Georgeanna Lea, MD, [facility] 12/01/2022 10:10 AM     [facility]

## 2022-12-01 NOTE — Patient Instructions (Addendum)
Medication Instructions:  Your physician recommends that you continue on your current medications as directed. Please refer to the Current Medication list given to you today.  *If you need a refill on your cardiac medications before your next appointment, please call your pharmacy*   Lab Work: 3rd Floor [address] Lipid panel- today If you have labs (blood work) drawn today and your tests are completely normal, you will receive your results only by: MyChart Message (if you have MyChart) OR A paper copy in the mail If you have any lab test that is abnormal or we need to change your treatment, we will call you to review the results.   Testing/Procedures: Your physician has requested that you have a lexiscan myoview. For further information please visit [email or web address] Please follow instruction sheet, as given.  The test will take approximately 3 to 4 hours to complete; you may bring reading material.  If someone comes with you to your appointment, they will need to remain in the main lobby due to limited space in the testing area.  How to prepare for your Myocardial Perfusion Test: Do not eat or drink 3 hours prior to your test, except you may have water. Do not consume products containing caffeine (regular or decaffeinated) 12 hours prior to your test. (ex: coffee, chocolate, sodas, tea). Do bring a list of your current medications with you.  If not listed below, you may take your medications as normal. Do wear comfortable clothes (no dresses or overalls) and walking shoes, tennis shoes preferred (No heels or open toe shoes are allowed). Do NOT wear cologne, perfume, aftershave, or lotions (deodorant is allowed). If these instructions are not followed, your test will have to be rescheduled.     Follow-Up: At [facility], you and your health needs are our priority.  As part of our continuing mission to provide you with exceptional heart care, we have created designated Provider  Care Teams.  These Care Teams include your primary Cardiologist (physician) and Advanced Practice Providers (APPs -  Physician Assistants and Nurse Practitioners) who all work together to provide you with the care you need, when you need it.  We recommend signing up for the patient portal called "MyChart".  Sign up information is provided on this After Visit Summary.  MyChart is used to connect with patients for Virtual Visits (Telemedicine).  Patients are able to view lab/test results, encounter notes, upcoming appointments, etc.  Non-urgent messages can be sent to your provider as well.   To learn more about what you can do with MyChart, go to [email or web address].    Your next appointment:   6 month(s)  The format for your next appointment:   In Person  Provider:   Gypsy Balsam, MD    Other Instructions NA

## 2022-12-02 LAB — LIPID PANEL
Chol/HDL Ratio: 3 ratio (ref 0.0–5.0)
Cholesterol, Total: 133 mg/dL (ref 100–199)
HDL: 44 mg/dL (ref >39)
LDL Chol Calc (NIH): 56 mg/dL (ref 0–99)
Triglycerides: 203 mg/dL — ABNORMAL HIGH (ref 0–149)
VLDL Cholesterol Cal: 33 mg/dL (ref 5–40)

## 2022-12-08 ENCOUNTER — Telehealth: Payer: Self-pay

## 2022-12-08 NOTE — Telephone Encounter (Signed)
LVM per DPR- per Dr. Krasowski's note regarding normal Echo results. Encouraged to call with any questions. Routed to PCP. 

## 2022-12-09 ENCOUNTER — Telehealth (HOSPITAL_COMMUNITY): Payer: Self-pay | Admitting: *Deleted

## 2022-12-09 NOTE — Telephone Encounter (Signed)
Left message on voicemail per DPR in reference to upcoming appointment scheduled on 12/15/2022 at 8:00 with detailed instructions given per Myocardial Perfusion Study Information Sheet for the test. LM to arrive 15 minutes early, and that it is imperative to arrive on time for appointment to keep from having the test rescheduled. If you need to cancel or reschedule your appointment, please call the office within 24 hours of your appointment. Failure to do so may result in a cancellation of your appointment, and a $50 no show fee. Phone number given for call back for any questions.

## 2022-12-10 ENCOUNTER — Other Ambulatory Visit (HOSPITAL_BASED_OUTPATIENT_CLINIC_OR_DEPARTMENT_OTHER): Payer: Self-pay

## 2022-12-15 ENCOUNTER — Ambulatory Visit (HOSPITAL_COMMUNITY): Payer: PPO | Attending: Cardiology

## 2022-12-15 ENCOUNTER — Encounter (HOSPITAL_COMMUNITY): Payer: Self-pay | Admitting: *Deleted

## 2022-12-15 DIAGNOSIS — R0609 Other forms of dyspnea: Secondary | ICD-10-CM | POA: Diagnosis not present

## 2022-12-15 LAB — MYOCARDIAL PERFUSION IMAGING
LV dias vol: 133 mL (ref 62–150)
LV sys vol: 63 mL
Nuc Stress EF: 53 %
Peak HR: 86 {beats}/min
Rest HR: 72 {beats}/min
Rest Nuclear Isotope Dose: 10.6 mCi
SDS: 2
SRS: 0
SSS: 2
ST Depression (mm): 0 mm
Stress Nuclear Isotope Dose: 31.5 mCi
TID: 0.99

## 2022-12-15 MED ORDER — TECHNETIUM TC 99M TETROFOSMIN IV KIT
10.6000 | PACK | Freq: Once | INTRAVENOUS | Status: AC | PRN
  Administered 2022-12-15: 10.6 via INTRAVENOUS

## 2022-12-15 MED ORDER — REGADENOSON 0.4 MG/5ML IV SOLN
0.4000 mg | Freq: Once | INTRAVENOUS | Status: AC
  Administered 2022-12-15: 0.4 mg via INTRAVENOUS

## 2022-12-15 MED ORDER — TECHNETIUM TC 99M TETROFOSMIN IV KIT
31.5000 | PACK | Freq: Once | INTRAVENOUS | Status: AC | PRN
  Administered 2022-12-15: 31.5 via INTRAVENOUS

## 2022-12-18 ENCOUNTER — Telehealth: Payer: Self-pay

## 2022-12-18 NOTE — Telephone Encounter (Signed)
-----   Message from Georgeanna Lea, MD sent at 12/18/2022 10:08 AM EDT ----- Stress test was normal

## 2022-12-18 NOTE — Telephone Encounter (Signed)
Patient notified of results.

## 2022-12-21 ENCOUNTER — Ambulatory Visit (HOSPITAL_COMMUNITY): Payer: PPO

## 2022-12-28 ENCOUNTER — Other Ambulatory Visit: Payer: Self-pay | Admitting: Physician Assistant

## 2022-12-28 DIAGNOSIS — K219 Gastro-esophageal reflux disease without esophagitis: Secondary | ICD-10-CM

## 2023-01-06 ENCOUNTER — Telehealth: Payer: Self-pay | Admitting: Family Medicine

## 2023-01-07 NOTE — Telephone Encounter (Signed)
ok 

## 2023-01-28 ENCOUNTER — Other Ambulatory Visit: Payer: Self-pay | Admitting: Physician Assistant

## 2023-01-28 ENCOUNTER — Other Ambulatory Visit (HOSPITAL_BASED_OUTPATIENT_CLINIC_OR_DEPARTMENT_OTHER): Payer: Self-pay

## 2023-01-28 DIAGNOSIS — E1165 Type 2 diabetes mellitus with hyperglycemia: Secondary | ICD-10-CM

## 2023-01-28 MED ORDER — INSULIN NPH (HUMAN) (ISOPHANE) 100 UNIT/ML ~~LOC~~ SUSP
25.0000 [IU] | Freq: Every day | SUBCUTANEOUS | 0 refills | Status: DC
Start: 2023-01-28 — End: 2023-05-20
  Filled 2023-01-28: qty 30, 90d supply, fill #0

## 2023-01-29 ENCOUNTER — Other Ambulatory Visit (HOSPITAL_BASED_OUTPATIENT_CLINIC_OR_DEPARTMENT_OTHER): Payer: Self-pay

## 2023-02-02 ENCOUNTER — Other Ambulatory Visit (HOSPITAL_COMMUNITY): Payer: Self-pay

## 2023-02-02 ENCOUNTER — Other Ambulatory Visit (HOSPITAL_BASED_OUTPATIENT_CLINIC_OR_DEPARTMENT_OTHER): Payer: Self-pay

## 2023-02-05 ENCOUNTER — Other Ambulatory Visit (HOSPITAL_BASED_OUTPATIENT_CLINIC_OR_DEPARTMENT_OTHER): Payer: Self-pay

## 2023-02-05 ENCOUNTER — Other Ambulatory Visit (HOSPITAL_COMMUNITY): Payer: Self-pay

## 2023-02-05 ENCOUNTER — Other Ambulatory Visit: Payer: Self-pay

## 2023-02-05 ENCOUNTER — Ambulatory Visit (INDEPENDENT_AMBULATORY_CARE_PROVIDER_SITE_OTHER): Payer: PPO | Admitting: Physician Assistant

## 2023-02-05 ENCOUNTER — Encounter: Payer: Self-pay | Admitting: Physician Assistant

## 2023-02-05 VITALS — BP 146/68 | HR 72 | Ht 74.0 in | Wt 323.0 lb

## 2023-02-05 DIAGNOSIS — N183 Chronic kidney disease, stage 3 unspecified: Secondary | ICD-10-CM

## 2023-02-05 DIAGNOSIS — E1122 Type 2 diabetes mellitus with diabetic chronic kidney disease: Secondary | ICD-10-CM

## 2023-02-05 DIAGNOSIS — I10 Essential (primary) hypertension: Secondary | ICD-10-CM | POA: Diagnosis not present

## 2023-02-05 DIAGNOSIS — E162 Hypoglycemia, unspecified: Secondary | ICD-10-CM | POA: Diagnosis not present

## 2023-02-05 DIAGNOSIS — Z794 Long term (current) use of insulin: Secondary | ICD-10-CM | POA: Diagnosis not present

## 2023-02-05 DIAGNOSIS — C61 Malignant neoplasm of prostate: Secondary | ICD-10-CM | POA: Diagnosis not present

## 2023-02-05 DIAGNOSIS — R351 Nocturia: Secondary | ICD-10-CM | POA: Diagnosis not present

## 2023-02-05 LAB — POCT GLYCOSYLATED HEMOGLOBIN (HGB A1C): Hemoglobin A1C: 6.1 % — AB (ref 4.0–5.6)

## 2023-02-05 MED ORDER — FREESTYLE LIBRE 3 SENSOR MISC
1.0000 | 11 refills | Status: DC
Start: 2023-02-05 — End: 2023-10-27
  Filled 2023-02-05: qty 2, fill #0
  Filled 2023-02-25: qty 2, 28d supply, fill #0
  Filled 2023-03-05 – 2023-03-19 (×2): qty 2, 28d supply, fill #1
  Filled 2023-04-15: qty 2, 28d supply, fill #2
  Filled 2023-05-20: qty 2, 28d supply, fill #3
  Filled 2023-06-24: qty 2, 28d supply, fill #4
  Filled 2023-07-19: qty 2, 28d supply, fill #5
  Filled 2023-08-27 (×2): qty 2, 28d supply, fill #6
  Filled 2023-09-23: qty 2, 28d supply, fill #7
  Filled 2023-10-27: qty 2, 28d supply, fill #8

## 2023-02-05 MED ORDER — FINASTERIDE 5 MG PO TABS
5.0000 mg | ORAL_TABLET | Freq: Every day | ORAL | 3 refills | Status: DC
  Filled 2023-02-05 (×2): qty 30, 30d supply, fill #0
  Filled 2023-02-05: qty 90, 90d supply, fill #0
  Filled 2023-03-05: qty 30, 30d supply, fill #1
  Filled 2023-04-15: qty 90, 90d supply, fill #2
  Filled 2023-07-19 (×3): qty 90, 90d supply, fill #3
  Filled 2023-10-27: qty 90, 90d supply, fill #4

## 2023-02-05 MED ORDER — HYDRALAZINE HCL 25 MG PO TABS
25.0000 mg | ORAL_TABLET | Freq: Three times a day (TID) | ORAL | 3 refills | Status: AC
Start: 2023-02-05 — End: ?
  Filled 2023-02-05: qty 270, 90d supply, fill #0

## 2023-02-05 MED ORDER — GVOKE HYPOPEN 2-PACK 1 MG/0.2ML ~~LOC~~ SOAJ
SUBCUTANEOUS | 1 refills | Status: DC
Start: 2023-02-05 — End: 2023-03-02
  Filled 2023-02-05: qty 0.4, 1d supply, fill #0

## 2023-02-05 MED ORDER — FREESTYLE LIBRE 3 READER DEVI
0 refills | Status: DC
Start: 2023-02-05 — End: 2023-03-19
  Filled 2023-02-05: qty 1, fill #0
  Filled 2023-02-25: qty 1, 30d supply, fill #0

## 2023-02-05 MED ORDER — METOPROLOL TARTRATE 100 MG PO TABS
100.0000 mg | ORAL_TABLET | Freq: Two times a day (BID) | ORAL | 1 refills | Status: DC
Start: 2023-02-05 — End: 2023-03-31
  Filled 2023-02-05: qty 180, 90d supply, fill #0

## 2023-02-05 MED ORDER — TIRZEPATIDE 7.5 MG/0.5ML ~~LOC~~ SOAJ
7.5000 mg | SUBCUTANEOUS | 0 refills | Status: DC
Start: 2023-02-05 — End: 2023-02-25
  Filled 2023-02-05 – 2023-02-18 (×2): qty 2, 28d supply, fill #0

## 2023-02-05 NOTE — Progress Notes (Signed)
Established Patient Office Visit  Subjective   Patient ID: Jeremiah Thomas, male    DOB: [birth year]  Age: 67 y.o. MRN: [id number]  Chief Complaint  Patient presents with   Medical Management of Chronic Issues    Last A1c 6.7, pt has a seziure 3 weeks ago, did not follow up at hospital    HPI Pt is a 67 yo obese male with T2DM, HTN, CKD, HLD, hx of stroke who presents to the clinic for follow up.   He is doing "ok". Not checking BP or sugars. He does like like to stick his fingers often. He does get some lows in the 40s with sugars and has to eat. 3 weeks ago he went really low and started "shaking" he drank sugar water and then felt better. He is taking humulin at bedtime 25 units. Ran out of mounjaro and cannot afford.   Active Ambulatory Problems    Diagnosis Date Noted   History of stroke 05/20/2016   Asymptomatic hypertensive urgency 05/20/2016   Acute kidney injury (HCC) 06/02/2016   Essential hypertension, benign 06/02/2016   CKD (chronic kidney disease) stage 3, GFR 30-59 ml/min (HCC) 06/03/2016   Frequent urination at night 05/13/2017   Elevated PSA 07/20/2017   Elevated fasting glucose 07/20/2017   Nasal congestion 08/15/2017   Class 3 severe obesity due to excess calories with serious comorbidity and body mass index (BMI) of 40.0 to 44.9 in adult (HCC) 09/08/2017   Type II diabetes mellitus, uncontrolled 09/13/2017   Bilateral leg edema 02/21/2018   Craving for particular food 02/21/2018   Newly recognized heart murmur 02/21/2018   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   Non-restorative sleep 06/13/2018   Snoring 06/13/2018   No energy 06/13/2018   OSA (obstructive sleep apnea) 08/22/2018   Paresthesia 09/12/2018   Dizziness 09/12/2018   Orthostatic hypotension 12/07/2018   Pneumonia due to COVID-19 virus 08/25/2019   Chronic respiratory failure with hypoxia (HCC) 08/25/2019   Elevated lipase 08/25/2019   Low TSH level 08/25/2019   Bilateral pulmonary embolism (HCC)  08/25/2019   Lower GI bleed 08/25/2019   Malnutrition of mild degree (HCC) 08/25/2019   Serum albumin decreased 08/25/2019   Anemia 08/25/2019   History of COVID-19 09/20/2019   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, without long-term current use of insulin (HCC) 09/20/2019   Depressed mood 09/20/2019   Former smoker 12/19/2019   Benign prostatic hyperplasia without lower urinary tract symptoms 12/19/2019   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   Hypertension    Congestive heart failure (CHF) (HCC) 12/02/2020   CKD (chronic kidney disease) stage 4, GFR 15-29 ml/min (HCC) 01/31/2021   AV block, 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   RBBB (right bundle branch block) 01/31/2021   Tremor 02/03/2021   Chronic diastolic congestive heart failure (HCC) 03/03/2021   Hyperlipidemia associated with type 2 diabetes mellitus (HCC) 04/30/2021   Positive colorectal cancer screening using Cologuard test 08/05/2021   Shortness of breath 01/30/2022   Uncontrolled type 2 diabetes mellitus with hyperglycemia (HCC) 01/30/2022   Near syncope 05/11/2022   Overgrown toenails 08/10/2022   Diabetic peripheral neuropathy associated with type 2 diabetes mellitus (HCC) 08/10/2022   Chronic obstructive pulmonary disease (HCC) 11/12/2022   H/O prostate cancer 06/23/2019   Obesity (BMI 30-39.9) 06/23/2019   Hypoglycemia 02/17/2023   Resolved Ambulatory Problems    Diagnosis Date Noted   Prostate cancer (HCC) 10/22/2017   Past Medical History:  Diagnosis Date   Lightheaded  09/12/2018   Morbidly obese (HCC) 09/08/2017     ROS See HPI.    Objective:     BP (!) 146/68   Pulse 72   Ht 6\' 2"  (1.88 m)   Wt (!) 323 lb (146.5 kg)   SpO2 98%   BMI 41.47 kg/m  BP Readings from Last 3 Encounters:  02/05/23 (!) 146/68  12/01/22 (!) 150/90  11/04/22 138/74   Wt Readings from Last 3 Encounters:  02/05/23 (!) 323 lb (146.5 kg)  12/01/22 (!) 326  lb (147.9 kg)  11/04/22 (!) 324 lb (147 kg)      Physical Exam Constitutional:      Appearance: Normal appearance. He is obese.  HENT:     Head: Normocephalic.  Cardiovascular:     Rate and Rhythm: Normal rate and regular rhythm.  Pulmonary:     Effort: Pulmonary effort is normal.     Breath sounds: Normal breath sounds.  Neurological:     General: No focal deficit present.     Mental Status: He is alert and oriented to person, place, and time.  Psychiatric:        Mood and Affect: Mood normal.      Results for orders placed or performed in visit on 02/05/23  CMP14+EGFR  Result Value Ref Range   Glucose 118 (H) 70 - 99 mg/dL   BUN 24 8 - 27 mg/dL   Creatinine, Ser 5.18 (H) 0.76 - 1.27 mg/dL   eGFR 23 (L) >84 ZY/SAY/3.01   BUN/Creatinine Ratio 8 (L) 10 - 24   Sodium 143 134 - 144 mmol/L   Potassium 4.3 3.5 - 5.2 mmol/L   Chloride 109 (H) 96 - 106 mmol/L   CO2 20 20 - 29 mmol/L   Calcium 9.0 8.6 - 10.2 mg/dL   Total Protein 6.4 6.0 - 8.5 g/dL   Albumin 3.8 (L) 3.9 - 4.9 g/dL   Globulin, Total 2.6 1.5 - 4.5 g/dL   Bilirubin Total 0.4 0.0 - 1.2 mg/dL   Alkaline Phosphatase 104 44 - 121 IU/L   AST 23 0 - 40 IU/L   ALT 33 0 - 44 IU/L  POCT HgB A1C  Result Value Ref Range   Hemoglobin A1C 6.1 (A) 4.0 - 5.6 %   HbA1c POC (<> result, manual entry)     HbA1c, POC (prediabetic range)     HbA1c, POC (controlled diabetic range)       The 10-year ASCVD risk score (Arnett DK, et al., 2019) is: 35.7%    Assessment & Plan:  .Marland KitchenJohn was seen today for medical management of chronic issues.  Diagnoses and all orders for this visit:  Controlled type 2 diabetes mellitus with stage 3 chronic kidney disease, with long-term current use of insulin (HCC) -     POCT HgB A1C -     Continuous Glucose Receiver (FREESTYLE LIBRE 14 DAY READER) DEVI; One reader to Use with sensor system -     Continuous Glucose Sensor (FREESTYLE LIBRE 14 DAY SENSOR) MISC; 1 Application by Does not apply  route every 14 (fourteen) days. Apply upper deltoid every 14 days, use reader to determine blood sugars -     Glucagon (GVOKE HYPOPEN 2-PACK) 1 MG/0.2ML SOAJ; Inject one pen if sugars are below 54. -     tirzepatide (MOUNJARO) 7.5 MG/0.5ML Pen; Inject 7.5 mg into the skin once a week. -     CMP14+EGFR  Frequent urination at night -     finasteride (PROSCAR)  5 MG tablet; Take 1 tablet (5 mg total) by mouth daily. -     CMP14+EGFR  Prostate cancer (HCC) -     finasteride (PROSCAR) 5 MG tablet; Take 1 tablet (5 mg total) by mouth daily. -     CMP14+EGFR  Essential hypertension, benign -     hydrALAZINE (APRESOLINE) 25 MG tablet; Take 1 tablet (25 mg total) by mouth 3 (three) times daily. -     metoprolol tartrate (LOPRESSOR) 100 MG tablet; Take 1 tablet (100 mg total) by mouth 2 (two) times daily. -     CMP14+EGFR  Hypoglycemia -     Glucagon (GVOKE HYPOPEN 2-PACK) 1 MG/0.2ML SOAJ; Inject one pen if sugars are below 54.  Very concerned about hypoglycemic event and possible seizure  Gvoke sent to pharmacy to use with sugars under 54 We need more testing and numbers to adjust sugars Pt needs libre, sample was placed today Will see if pharmacy can assist [place] with mounjaro refills and libre Discussed hypoglycemic symptoms and eating small frequent meals Goal fasting glucose 90-130 in morning Follow up in 4 weeks.      Return in about 4 weeks (around 03/05/2023) for DM.    Tandy Gaw, PA-C

## 2023-02-05 NOTE — Patient Instructions (Addendum)
I want to see one month of continuous glucose monitoring Pharmacy is going to reach out to you to help with medications Decrease night time novolin to 20 units Everything send to med center [place] GVOKE if sugars below 54  Hypoglycemia Hypoglycemia is when the amount of sugar, or glucose, in your blood is too low. Low blood sugar can happen if you have diabetes or if you don't have diabetes. It may be an emergency. What are the causes? Low blood sugar happens most often in people who have diabetes. It may be caused by: Diabetes medicine. Not eating enough, or not eating often enough. Being more active than normal. If you don't have diabetes, you may still get low blood sugar if: There's a tumor in your pancreas. A tumor is a growth of cells that isn't normal. You don't eat enough, or you fast. Fasting is when you don't eat for long periods at a time. You have a bad infection or illness. You have problems after weight loss surgery. You have kidney or liver problems. You take certain medicines. What increases the risk? You're more likely to have low blood sugar if: You have diabetes and take medicine for it. You drink a lot of alcohol. You get sick. What are the signs or symptoms? Mild Hunger or feeling like you may vomit. Sweating and feeling cold to the touch. Feeling dizzy or light-headed. Being sleepy or having trouble sleeping. A headache. Blurry vision. Mood changes. These include feeling worried, nervous, or easily annoyed. Moderate Feeling confused. Changes in the way you act. Weakness. An uneven heartbeat. Very bad Having very low blood sugar is an emergency. It can cause: Fainting. Seizures. A coma. Death. How is this diagnosed?  Low blood sugar can be found with a blood test. This test tells you how much sugar is in your blood. It's done while you're having symptoms. Your health care provider may also do an exam and look at your medical history. How is this  treated? Treating low blood sugar If you have low blood sugar, eat or drink something with sugar in it right away. The food or drink should have 15 grams of a fast-acting carbohydrate (carb). Options include: 4 oz (120 mL) of fruit juice. 4 oz (120 mL) of soda (not diet soda). A few pieces of hard candy. Check food labels to see how many pieces to eat. 1 Tbsp (15 mL) of sugar or honey. 4 glucose tablets. 1 tube of glucose gel. Treating low blood sugar if you have diabetes Talk with your provider about how much carb you should take. If you're alert and can swallow safely, you may follow the 15:15 rule: Take 15 grams of a fast-acting carb. Check your blood sugar 15 minutes after you take the carb. If your blood sugar is still at or below 70 mg/dL (3.9 mmol/L), take 15 grams of a carb again. If your blood sugar doesn't go above 70 mg/dL (3.9 mmol/L) after 3 tries, get help right away. After your blood sugar goes back to normal, eat a meal or a snack within 1 hour. Always keep 15 grams of a fast-acting carb with you. This could be: 4 glucose tablets. A few pieces of hard candy. 1 Tbsp (15 mL) of honey or sugar. 1 tube of glucose gel. Treating very low blood sugar If your blood sugar is less than 54 mg/dL (3 mmol/L), it's an emergency. Get help right away. If you can't eat or drink, you will need to be given  glucagon. A family member or friend should learn how to check your blood sugar and give you glucagon. Ask your provider if you should keep a glucagon kit at home. You may also need to be treated in a hospital. Follow these instructions at home: If you have diabetes: Always keep a fast-acting carb (15 grams) with you. Follow your diabetes care plan. Make sure you: Know the symptoms of low blood sugar. Check your blood sugar as often as told. Always check it before and after you exercise. Always check your blood sugar before you drive. Take your medicines as told. Eat on time. Do not  skip meals. Share your diabetes care plan with: Your work or school. The people you live with. Wear an alert bracelet or carry a card that says you have diabetes. General instructions If you drink alcohol: Limit how much you have to: 0-1 drink a day if you're male. 0-2 drinks a day if you're male. Know how much alcohol is in your drink. In the [place] one drink is one 12 oz bottle of beer (355 mL), one 5 oz glass of wine (148 mL), or one 1 oz glass of hard liquor (44 mL). Be sure to eat food when you drink alcohol. Be sure to check your blood sugar after you drink. Alcohol may lead to low blood sugar later. Where to find more information American Diabetes Association (ADA): diabetes.org Contact a health care provider if: You have low blood sugar often. You have diabetes and are having trouble keeping your blood sugar in the right range. Get help right away if: You can't get your blood sugar above 70 mg/dL (3.9 mmol/L) after 3 tries. Your blood sugar is below 54 mg/dL (3 mmol/L). You have a seizure. You faint. These symptoms may be an emergency. Call 911 right away. Do not wait to see if the symptoms will go away. Do not drive yourself to the hospital. This information is not intended to replace advice given to you by your health care provider. Make sure you discuss any questions you have with your health care provider. Document Revised: 08/19/2022 Document Reviewed: 08/19/2022 Elsevier Patient Education  2024 [employer].

## 2023-02-06 LAB — CMP14+EGFR
ALT: 33 IU/L (ref 0–44)
AST: 23 IU/L (ref 0–40)
Albumin: 3.8 g/dL — ABNORMAL LOW (ref 3.9–4.9)
Alkaline Phosphatase: 104 IU/L (ref 44–121)
BUN/Creatinine Ratio: 8 — ABNORMAL LOW (ref 10–24)
BUN: 24 mg/dL (ref 8–27)
Bilirubin Total: 0.4 mg/dL (ref 0.0–1.2)
CO2: 20 mmol/L (ref 20–29)
Calcium: 9 mg/dL (ref 8.6–10.2)
Chloride: 109 mmol/L — ABNORMAL HIGH (ref 96–106)
Creatinine, Ser: 2.84 mg/dL — ABNORMAL HIGH (ref 0.76–1.27)
Globulin, Total: 2.6 g/dL (ref 1.5–4.5)
Glucose: 118 mg/dL — ABNORMAL HIGH (ref 70–99)
Potassium: 4.3 mmol/L (ref 3.5–5.2)
Sodium: 143 mmol/L (ref 134–144)
Total Protein: 6.4 g/dL (ref 6.0–8.5)
eGFR: 23 mL/min/{1.73_m2} — ABNORMAL LOW (ref >59)

## 2023-02-08 ENCOUNTER — Other Ambulatory Visit (HOSPITAL_COMMUNITY): Payer: Self-pay

## 2023-02-08 NOTE — Progress Notes (Signed)
Stable CKD. Recheck in 3 months .

## 2023-02-17 ENCOUNTER — Encounter: Payer: Self-pay | Admitting: Physician Assistant

## 2023-02-17 DIAGNOSIS — E162 Hypoglycemia, unspecified: Secondary | ICD-10-CM | POA: Insufficient documentation

## 2023-02-18 ENCOUNTER — Other Ambulatory Visit (HOSPITAL_BASED_OUTPATIENT_CLINIC_OR_DEPARTMENT_OTHER): Payer: Self-pay

## 2023-02-18 ENCOUNTER — Telehealth: Payer: Self-pay

## 2023-02-18 NOTE — Progress Notes (Addendum)
   Care Guide Note  02/18/2023 Name: Khale Brey MRN: [id number] DOB: 09-Mar-1956  Referred by: Jomarie Longs, PA-C Reason for referral : Care Coordination (Outreach to schedule with Pharm d )   Mozell Rosenboom is a 67 y.o. year old male who is a primary care patient of Jomarie Longs, PA-C. Dorena Cookey was referred to the pharmacist for assistance related to DM.    Successful contact was made with the patient to discuss pharmacy services including being ready for the pharmacist to call at least 5 minutes before the scheduled appointment time, to have medication bottles and any blood sugar or blood pressure readings ready for review. The patient agreed to meet with the pharmacist via with the pharmacist via telephone visit on (date/time).  02/25/2023  Penne Lash, RMA Care Guide [facility]  [place], [place] [address] Direct Dial: [phone] [email or web address]

## 2023-02-25 ENCOUNTER — Other Ambulatory Visit (HOSPITAL_BASED_OUTPATIENT_CLINIC_OR_DEPARTMENT_OTHER): Payer: Self-pay

## 2023-02-25 ENCOUNTER — Other Ambulatory Visit: Payer: PPO

## 2023-02-25 NOTE — Progress Notes (Signed)
02/25/2023 Name: Jeremiah Thomas MRN: [id number] DOB: Jul 01, 1955  Chief Complaint  Patient presents with   Medication Assistance   Jeremiah Thomas is a 67 y.o. year old male who presented for a telephone visit.   They were referred to the pharmacist by their PCP for assistance in managing medication access.   Subjective:  Care Team: Primary Care Provider: Nolene Ebbs ; Next Scheduled Visit: 9/20  Medication Access/Adherence  Current Pharmacy:  [facility] [place] [facility] [address], [address] [place] [address] Phone: [phone] Fax: [phone]  -Patient reports affordability concerns with their medications: Yes - Mounjaro, Libre CGM, and Glucagon -Patient reports access/transportation concerns to their pharmacy: No  -Patient reports adherence concerns with their medications:  Yes  Has not had Mounjaro in approximately 3 weeks and has not been able to obtain CGM or Glucagon  Diabetes: Current medications: glipizide 10mg  BID, Humulin N 25 units at bedtime -Patient is prescribed Mounjaro 7.5mg  weekly, but he has been out of this medication approximately 3 weeks due to affordability (in coverage gap) -Recently prescribed Libre CGM but has not yet been able to obtain (unclear reason) -Glucagon pen prescribed to have on hand if needed for severe hypoglycemia, but this was going to be $171 on insurance -Patient testing home BG with glucometer, test strips and lancets; he endorses FBG ranging 146-190  Hypertension: Current medications: amlodipine 5mg  daily, hydralazine 25mg  TID, metoprolol 100mg  BID -Patient has a validated, automated, upper arm home BP cuff -He is not currently checking home BP regularly, but last OV reading 8/23 was 146/68 -Currently not meeting quality metric for TNM: Hypertension Control in Black or African American Population -Patient denies hypotensive s/sx including dizziness, lightheadedness.  -Patient reports  hypertensive symptoms including headache, chest pain, shortness of breath  Objective: Lab Results  Component Value Date   HGBA1C 6.1 (A) 02/05/2023    Lab Results  Component Value Date   CREATININE 2.84 (H) 02/05/2023   BUN 24 02/05/2023   NA 143 02/05/2023   K 4.3 02/05/2023   CL 109 (H) 02/05/2023   CO2 20 02/05/2023   Medications Reviewed Today     Reviewed by Lenna Gilford, RPH (Pharmacist) on 02/25/23 at 1003  Med List Status: <None>   Medication Order Taking? Sig Documenting Provider Last Dose Status Informant  albuterol (VENTOLIN HFA) 108 (90 Base) MCG/ACT inhaler [id number] Yes INHALE 2 PUFFS INTO THE LUNGS EVERY 6 HOURS AS NEEDED FOR WHEEZING OR SHORTNESS OF BREATH  Patient taking differently: Inhale 2 puffs into the lungs every 6 (six) hours as needed for wheezing or shortness of breath. INHALE 2 PUFFS INTO THE LUNGS EVERY 6 HOURS AS NEEDED FOR WHEEZING OR SHORTNESS OF BREATH   Jomarie Longs, PA-C Taking Active   AMBULATORY NON FORMULARY MEDICATION [id number] Yes Continuous positive airway pressure (CPAP) machine set at autopap from 5-20cmH20, with all supplemental supplies as needed.  Patient taking differently: Inhale 5-20 cm into the lungs as needed (Sleep aid). Continuous positive airway pressure (CPAP) machine set at autopap from 5-20cmH20, with all supplemental supplies as needed.   Jomarie Longs, PA-C Taking Active   amLODipine (NORVASC) 5 MG tablet [id number] Yes TAKE 1 TABLET(5 MG) BY MOUTH DAILY  Patient taking differently: Take 5 mg by mouth daily. TAKE 1 TABLET(5 MG) BY MOUTH DAILY   Breeback, Jade L, PA-C Taking Active   aspirin EC 81 MG tablet [id number] Yes Take 81 mg by mouth daily. [provider] Taking Active   atorvastatin (LIPITOR) 80 MG tablet [id number] Yes Take 1 tablet (80 mg total) by mouth daily. Jomarie Longs, PA-C Taking Active   BD VEO INSULIN SYR U/F 1/2UNIT 31G X 15/64" 0.3 ML MISC [id number] Yes USE DAILY WITH NOVOLIN INSULIN   Patient taking differently: 1 each by Other route daily.   Breeback, Jade L, PA-C Taking Active   Blood Glucose Monitoring Suppl (MM EASY TOUCH GLUCOSE METER) w/Device KIT [id number] Yes 1 Stick by Does not apply route daily. Check fasting blood sugar daily. Dx - DM ICD-10- E11.8 Jomarie Longs, PA-C Taking Active   Continuous Glucose Receiver (FREESTYLE LIBRE 14 DAY READER) DEVI [id number] No One reader to Use with sensor system  Patient not taking: Reported on 02/25/2023   Jomarie Longs, PA-C Not Taking Active   Continuous Glucose Sensor (FREESTYLE LIBRE 14 DAY SENSOR) MISC [id number] No 1 Application by Does not apply route every 14 (fourteen) days. Apply upper deltoid every 14 days, use reader to determine blood sugars  Patient not taking: Reported on 02/25/2023   Jomarie Longs, PA-C Not Taking Active   DULoxetine (CYMBALTA) 30 MG capsule [id number] Yes TAKE 1 CAPSULE(30 MG) BY MOUTH DAILY  Patient taking differently: Take 30 mg by mouth daily. TAKE 1 CAPSULE(30 MG) BY MOUTH DAILY   Breeback, Jade L, PA-C Taking Active   finasteride (PROSCAR) 5 MG tablet [id number] Yes Take 1 tablet (5 mg total) by mouth daily. Jomarie Longs, PA-C Taking Active   fluticasone (FLONASE) 50 MCG/ACT nasal spray [id number] Yes USE 2 SPRAYS IN EACH NOSTRIL DAILY  Patient taking differently: Place 1 spray into both nostrils daily.   Jomarie Longs, PA-C Taking Active   Fluticasone-Umeclidin-Vilant (TRELEGY ELLIPTA) 100-62.5-25 MCG/ACT AEPB [id number] Yes Inhale 1 puff into the lungs daily. Jomarie Longs, PA-C Taking Active   glipiZIDE (GLUCOTROL) 10 MG tablet [id number] Yes Take 1 tablet (10 mg total) by mouth 2 (two) times daily before a meal. Breeback, Jade L, PA-C Taking Active   Glucagon (GVOKE HYPOPEN 2-PACK) 1 MG/0.2ML SOAJ [id number] No Inject one pen if sugars are below 54.  Patient not taking: Reported on 02/25/2023   Jomarie Longs, [place] Not Taking Active   glucose blood test strip [id number] Yes  Check fasting blood sugar daily. Dx - DM ICD-10- E11.8  Patient taking differently: 1 each by Other route as needed for other (Glucose reading). Check fasting blood sugar daily. Dx - DM ICD-10- E11.8   Jomarie Longs, PA-C Taking Active   hydrALAZINE (APRESOLINE) 25 MG tablet [id number] Yes Take 1 tablet (25 mg total) by mouth 3 (three) times daily. Jomarie Longs, PA-C Taking Active   insulin NPH Human (HUMULIN N) 100 UNIT/ML injection [id number] Yes Inject 0.25 mLs (25 Units total) into the skin at bedtime. Nolene Ebbs Taking Active   Lancets MISC [id number] Yes Check fasting blood sugar daily. Dx - DM ICD-10- E11.8  Patient taking differently: 1 each by Other route daily at 6 (six) AM. Check fasting blood sugar daily. Dx - DM ICD-10- E11.8   Jomarie Longs, PA-C Taking Active   metoprolol tartrate (LOPRESSOR) 100 MG tablet [id number] Yes Take 1 tablet (100 mg total) by mouth 2 (two) times daily. Jomarie Longs, PA-C Taking Active   pantoprazole (PROTONIX) 40 MG tablet [id number] Yes TAKE 1 TABLET(40 MG) BY MOUTH TWICE DAILY Breeback, Jade L, PA-C Taking Active   terbinafine (LAMISIL) 250 MG tablet [id number] Yes  Take 1 tablet (250 mg total) by mouth daily. Jomarie Longs, PA-C Taking Active   tirzepatide [facility]) 7.5 MG/0.5ML Pen [id number] No Inject 7.5 mg into the skin once a week.  Patient not taking: Reported on 02/25/2023   Jomarie Longs, PA-C Not Taking Active            Assessment/Plan:   Diabetes: - Currently controlled - Recommend initiating Ozempic, as patient would qualify for Novo PAP.  Will collaborate with medication assistance team to initiate application. - Recommend quick titration to 1mg , as patient was previously on Mounjaro 7.5mg  weekly and tolerating well - Contacting office to see if patient can get a sample.  If so, I recommend Ozempic 0.25mg  weekly x2 weeks, then 0.5mg  weekly until 1mg  received from PAP program - Contacted pharmacy and gave  verbal authorization to change Josephine Igo order to [place] [facility] based on pharmacy supply.  Both reader and sensors going through on patients insurance for $0 copay - Recommend patient purchase OTC glucose tablets to keep on hand  Hypertension: - Currently uncontrolled - Reccommended that patient check home BP daily and record - If BP consistently >130/80, I recommend increasing amlodipine to 10mg  daily or hydralazine to 50mg  TID  Follow Up Plan: Will contact patient to inform about availability of Ozempic samples and schedule telephone visit to check on home BP readings  Lenna Gilford, PharmD, DPLA

## 2023-03-02 ENCOUNTER — Telehealth: Payer: Self-pay

## 2023-03-02 NOTE — Progress Notes (Signed)
03/02/2023  Patient ID: Jeremiah Thomas, male   DOB: [birth year], 67 y.o.   MRN: [id number]  S/O Telephone follow-up after initial visit last week to assist with access/adherence/affordability of DM medications  Diabetes Management -PCK has sample(s) of Ozempic they are setting aside for patient that he can pick up at 9/20 visit with PCP -Educated patient on administration and to start with 0.25mg  weekly x2 weeks, increasing to 0.5mg  weekly after if tolerating well -Patient has not yet picked up [place] 3 Reader and Sensors, but I informed him these are going through on insurance; and he plans to pick up this week -Educated to purchase OTC glucose tabs to take in the event of hypoglycemia since Glucagon pen was going to be $171  HTN -Currently failing TNM:  HTN in Black or African American Population based on last OV BP reading of 146/68  A/P  Diabetes Management -Recommend patient bring in [place] 3 to next appointment if he needs assistance with use/set-up -Will also have PCP/CMA verify patient is comfortable with Ozempic administration since pen is difference from [facility] previously used  HTN -Take all BP medications prior to upcoming visit with PCP -Recommend 2nd check if needed at 9/20 PCP visit  Follow-up: 1 month  Lenna Gilford, PharmD, DPLA

## 2023-03-03 ENCOUNTER — Telehealth: Payer: Self-pay

## 2023-03-03 NOTE — Telephone Encounter (Signed)
PAP: Application for Ozempic has been submitted to PAP Companies: NovoNordisk, online    PLEASE BE ADVISED

## 2023-03-03 NOTE — Telephone Encounter (Signed)
-----   Message from Lenna Gilford sent at 02/25/2023 11:28 AM EDT ----- Good morning,  I spoke with Mr. Pennick this morning, and he would qualify for Ozempic PAP through Novo.  Do you all mind to initiate the application process for the 1mg  weekly dose?  Thank you!

## 2023-03-04 NOTE — Telephone Encounter (Signed)
Antawayla with [employer] called. Patient Assistance froms take 24-48 hours to process to let you know if a patient has been approved.

## 2023-03-05 ENCOUNTER — Ambulatory Visit (INDEPENDENT_AMBULATORY_CARE_PROVIDER_SITE_OTHER): Payer: PPO | Admitting: Physician Assistant

## 2023-03-05 ENCOUNTER — Encounter: Payer: Self-pay | Admitting: Physician Assistant

## 2023-03-05 ENCOUNTER — Other Ambulatory Visit (HOSPITAL_BASED_OUTPATIENT_CLINIC_OR_DEPARTMENT_OTHER): Payer: Self-pay

## 2023-03-05 ENCOUNTER — Other Ambulatory Visit: Payer: Self-pay | Admitting: Physician Assistant

## 2023-03-05 VITALS — BP 142/80 | HR 62 | Ht 74.0 in | Wt 325.2 lb

## 2023-03-05 DIAGNOSIS — E785 Hyperlipidemia, unspecified: Secondary | ICD-10-CM | POA: Diagnosis not present

## 2023-03-05 DIAGNOSIS — E1169 Type 2 diabetes mellitus with other specified complication: Secondary | ICD-10-CM | POA: Diagnosis not present

## 2023-03-05 DIAGNOSIS — B351 Tinea unguium: Secondary | ICD-10-CM

## 2023-03-05 DIAGNOSIS — E1122 Type 2 diabetes mellitus with diabetic chronic kidney disease: Secondary | ICD-10-CM

## 2023-03-05 DIAGNOSIS — N183 Chronic kidney disease, stage 3 unspecified: Secondary | ICD-10-CM

## 2023-03-05 DIAGNOSIS — I10 Essential (primary) hypertension: Secondary | ICD-10-CM

## 2023-03-05 DIAGNOSIS — Z794 Long term (current) use of insulin: Secondary | ICD-10-CM | POA: Diagnosis not present

## 2023-03-05 DIAGNOSIS — N184 Chronic kidney disease, stage 4 (severe): Secondary | ICD-10-CM

## 2023-03-05 DIAGNOSIS — E162 Hypoglycemia, unspecified: Secondary | ICD-10-CM

## 2023-03-05 MED ORDER — LOSARTAN POTASSIUM 25 MG PO TABS
25.0000 mg | ORAL_TABLET | Freq: Every day | ORAL | 0 refills | Status: DC
Start: 2023-03-05 — End: 2023-06-24
  Filled 2023-03-05: qty 90, 90d supply, fill #0

## 2023-03-05 NOTE — Patient Instructions (Signed)
Adding cozaar 25mg  daily.  Stay on ozempic .25mg  weekly for one month then increase to .5mg  weekly.

## 2023-03-05 NOTE — Telephone Encounter (Signed)
Pt just left clinic with ozempic samples to start .25mg  weekly for next month. His sensor was placed in office. He was given first ozempic shot today. BP continues to not reach goal. Add losartan 25mg  daily.  FYI

## 2023-03-05 NOTE — Progress Notes (Signed)
Established Patient Office Visit  Subjective   Patient ID: Jeremiah Thomas, male    DOB: Jul 11, 1955  Age: 67 y.o. MRN: [id number]  Chief Complaint  Patient presents with   Medical Management of Chronic Issues    DM    HPI Pt is a 67 yo obese male with T2DM, HTN, HLD, CKD, history of stroke who presents to the clinic   He had a few past hypoglycemic events but none in the last month since dose of medication was changed. He has been contacted by pharmacy and are getting ozempic approved. He needs samples to get started today. He tried to place his sensor and said it "fell off". He is checking BP at home and was 139/74 this morning. He is doing "ok". He is not currently checking his sugars.   .. Active Ambulatory Problems    Diagnosis Date Noted   History of stroke 05/20/2016   Asymptomatic hypertensive urgency 05/20/2016   Acute kidney injury (HCC) 06/02/2016   Essential hypertension, benign 06/02/2016   CKD (chronic kidney disease) stage 3, GFR 30-59 ml/min (HCC) 06/03/2016   Frequent urination at night 05/13/2017   Elevated PSA 07/20/2017   Elevated fasting glucose 07/20/2017   Nasal congestion 08/15/2017   Class 3 severe obesity due to excess calories with serious comorbidity and body mass index (BMI) of 40.0 to 44.9 in adult (HCC) 09/08/2017   Type II diabetes mellitus, uncontrolled 09/13/2017   Bilateral leg edema 02/21/2018   Craving for particular food 02/21/2018   Newly recognized heart murmur 02/21/2018   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   Non-restorative sleep 06/13/2018   Snoring 06/13/2018   No energy 06/13/2018   OSA (obstructive sleep apnea) 08/22/2018   Paresthesia 09/12/2018   Dizziness 09/12/2018   Orthostatic hypotension 12/07/2018   Pneumonia due to COVID-19 virus 08/25/2019   Elevated lipase 08/25/2019   Low TSH level 08/25/2019   Lower GI bleed 08/25/2019   Serum albumin decreased 08/25/2019   Anemia 08/25/2019   History of COVID-19 09/20/2019   Controlled  type 2 diabetes mellitus with diabetic dermatitis, without long-term current use of insulin (HCC) 09/20/2019   Depressed mood 09/20/2019   Former smoker 12/19/2019   Benign prostatic hyperplasia without lower urinary tract symptoms 12/19/2019   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   Hypertension    Congestive heart failure (CHF) (HCC) 12/02/2020   CKD (chronic kidney disease) stage 4, GFR 15-29 ml/min (HCC) 01/31/2021   AV block, 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   RBBB (right bundle branch block) 01/31/2021   Tremor 02/03/2021   Chronic diastolic congestive heart failure (HCC) 03/03/2021   Hyperlipidemia associated with type 2 diabetes mellitus (HCC) 04/30/2021   Positive colorectal cancer screening using Cologuard test 08/05/2021   Shortness of breath 01/30/2022   Uncontrolled type 2 diabetes mellitus with hyperglycemia (HCC) 01/30/2022   Near syncope 05/11/2022   Overgrown toenails 08/10/2022   Diabetic peripheral neuropathy associated with type 2 diabetes mellitus (HCC) 08/10/2022   Chronic obstructive pulmonary disease (HCC) 11/12/2022   H/O prostate cancer 06/23/2019   Obesity (BMI 30-39.9) 06/23/2019   Hypoglycemia 02/17/2023   Resolved Ambulatory Problems    Diagnosis Date Noted   Prostate cancer (HCC) 10/22/2017   Chronic respiratory failure with hypoxia (HCC) 08/25/2019   Bilateral pulmonary embolism (HCC) 08/25/2019   Malnutrition of mild degree (HCC) 08/25/2019   Past Medical History:  Diagnosis Date   Lightheaded 09/12/2018   Morbidly obese (HCC) 09/08/2017     ROS  See HPI.  Objective:     BP (!) 142/80   Pulse 62   Ht 6\' 2"  (1.88 m)   Wt (!) 325 lb 4 oz (147.5 kg)   SpO2 97%   BMI 41.76 kg/m  BP Readings from Last 3 Encounters:  03/05/23 (!) 142/80  02/05/23 (!) 146/68  12/01/22 (!) 150/90   Wt Readings from Last 3 Encounters:  03/05/23 (!) 325 lb 4 oz (147.5 kg)  02/05/23 (!) 323 lb (146.5 kg)  12/01/22  (!) 326 lb (147.9 kg)      Physical Exam Constitutional:      Appearance: Normal appearance. He is obese.  HENT:     Head: Normocephalic.  Cardiovascular:     Rate and Rhythm: Normal rate and regular rhythm.  Pulmonary:     Effort: Pulmonary effort is normal.  Neurological:     General: No focal deficit present.     Mental Status: He is alert and oriented to person, place, and time.  Psychiatric:        Mood and Affect: Mood normal.        The 10-year ASCVD risk score (Arnett DK, et al., 2019) is: 34.2%    Assessment & Plan:  .Marland KitchenJohn was seen today for medical management of chronic issues.  Diagnoses and all orders for this visit:  Controlled type 2 diabetes mellitus with stage 3 chronic kidney disease, with long-term current use of insulin (HCC)  CKD (chronic kidney disease) stage 4, GFR 15-29 ml/min (HCC) -     losartan (COZAAR) 25 MG tablet; Take 1 tablet (25 mg total) by mouth daily.  Essential hypertension, benign -     losartan (COZAAR) 25 MG tablet; Take 1 tablet (25 mg total) by mouth daily.  Hypoglycemia  Hyperlipidemia associated with type 2 diabetes mellitus (HCC)  HTN, goal below 130/80 -     losartan (COZAAR) 25 MG tablet; Take 1 tablet (25 mg total) by mouth daily.   Replaced libre sensor today Connected with reader Ozempic samples given today and first .25mg  injection given today and to continue weekly for first month then increase to .5mg  weekly.  BP not to goal on second recheck, start losartan 25mg  daily Recheck in 2 months Will send up date to pharmacy.   Spent 30 minutes with patient discussing ozempic and Jeremiah Thomas how to use and operate.    Declined all vaccines today Flu/Covid/pneumonia. Pt is aware of risk.    Return in about 2 months (around 05/05/2023) for Needs to schedule Medicare Wellness Exam/ 2 months BP and DM check.    Tandy Gaw, PA-C

## 2023-03-10 ENCOUNTER — Other Ambulatory Visit (HOSPITAL_BASED_OUTPATIENT_CLINIC_OR_DEPARTMENT_OTHER): Payer: Self-pay

## 2023-03-10 MED ORDER — TERBINAFINE HCL 250 MG PO TABS
250.0000 mg | ORAL_TABLET | Freq: Every day | ORAL | 0 refills | Status: DC
Start: 2023-03-10 — End: 2023-06-24
  Filled 2023-03-10: qty 90, 90d supply, fill #0

## 2023-03-11 NOTE — Telephone Encounter (Signed)
PAP: Patient assistance application for Ozempic has been approved by PAP Companies: NovoNordisk from 03/08/2023 to 02/28/2024. Medication should be delivered to PAP Delivery: Provider's office For further shipping updates, please contact Novo Nordisk at [phone] Pt ID is: [id number]

## 2023-03-13 ENCOUNTER — Other Ambulatory Visit: Payer: Self-pay | Admitting: Cardiology

## 2023-03-13 DIAGNOSIS — I10 Essential (primary) hypertension: Secondary | ICD-10-CM

## 2023-03-19 ENCOUNTER — Other Ambulatory Visit (HOSPITAL_BASED_OUTPATIENT_CLINIC_OR_DEPARTMENT_OTHER): Payer: Self-pay

## 2023-03-19 ENCOUNTER — Telehealth: Payer: Self-pay

## 2023-03-19 ENCOUNTER — Other Ambulatory Visit: Payer: Self-pay | Admitting: Physician Assistant

## 2023-03-19 DIAGNOSIS — E1122 Type 2 diabetes mellitus with diabetic chronic kidney disease: Secondary | ICD-10-CM

## 2023-03-19 NOTE — Telephone Encounter (Signed)
Received patient assistance  ozempic 4 mg /ml 4 boxes  Patient just arted on ozempic at 0.25mg  0n 03/03/2023  Patient not yet informed that this was received until provider reviews this message.

## 2023-03-19 NOTE — Telephone Encounter (Signed)
Patient informed and will pick up medication at his convenience.   Per Tandy Gaw - pen is dosed in 1mg  increments so o.k. for pt to use.

## 2023-03-22 ENCOUNTER — Other Ambulatory Visit: Payer: Self-pay

## 2023-03-23 ENCOUNTER — Other Ambulatory Visit (HOSPITAL_BASED_OUTPATIENT_CLINIC_OR_DEPARTMENT_OTHER): Payer: Self-pay

## 2023-03-23 MED ORDER — FREESTYLE LIBRE 3 READER DEVI
0 refills | Status: AC
  Filled 2023-03-23: qty 1, 10d supply, fill #0

## 2023-03-23 NOTE — Telephone Encounter (Signed)
Patient informed in office with wife present to also receive instructions.

## 2023-03-28 ENCOUNTER — Other Ambulatory Visit: Payer: Self-pay | Admitting: Physician Assistant

## 2023-03-28 DIAGNOSIS — I10 Essential (primary) hypertension: Secondary | ICD-10-CM

## 2023-03-31 ENCOUNTER — Other Ambulatory Visit: Payer: Self-pay | Admitting: Family Medicine

## 2023-03-31 DIAGNOSIS — R4589 Other symptoms and signs involving emotional state: Secondary | ICD-10-CM

## 2023-04-02 ENCOUNTER — Other Ambulatory Visit: Payer: Self-pay

## 2023-04-02 NOTE — Progress Notes (Signed)
04/02/2023  Patient ID: Jeremiah Thomas, male   DOB: 1956/03/21, 67 y.o.   MRN: [id number]  S/O Telephone visit to follow-up on management of T2DM and HTN  DM -Current medications:  Ozempic 0.5mg  weekly, gliplizide 10mg  BID before meals, Humulin N 25 units HS -Patient has had 2 doses of Ozempic 0.5mg  now and is tolerating well -Has enough samples on hand to complete another 2 weeks at 0.5mg  dose -Received 1mg  Ozempic from [facility] (4 boxes) -Using [place] 3 for CGM and states this is working well for him -FBG 117-120, post-prandial BG 150-180  HTN -Current medications:  amlodipine 5mg  daily, hydralazine 25mg  TID, metoprolol 100mg  BID, losartan 25mg  daily -Losartan 25mg  daily started at 9/20 PCP visit due to elevated BP -Patient states he has started losartan therapy and is tolerating well -Endorses home BP averaging 140/86  A/P  DM -Continue Ozempic 0.5mg  weekly for 2 more weeks, then increase to 1mg  weekly -Once Ozempic is increased to 1mg , decrease glipizide 10mg  to once daily before breakfast and decrease Humulin N to 20 units at bedtime to prevent hypoglycemia -Monitor FBG and post-prandial values  HTN -Continue current regimen at this time -Monitor and record home BP regularly  Follow-up:  11/15 and sees PCP 11/18  Lenna Gilford, PharmD, DPLA

## 2023-04-09 ENCOUNTER — Other Ambulatory Visit: Payer: Self-pay

## 2023-04-09 ENCOUNTER — Other Ambulatory Visit (HOSPITAL_BASED_OUTPATIENT_CLINIC_OR_DEPARTMENT_OTHER): Payer: Self-pay

## 2023-04-09 DIAGNOSIS — R4589 Other symptoms and signs involving emotional state: Secondary | ICD-10-CM

## 2023-04-09 MED ORDER — DULOXETINE HCL 30 MG PO CPEP
ORAL_CAPSULE | ORAL | 0 refills | Status: DC
  Filled 2023-04-09: qty 90, 90d supply, fill #0

## 2023-04-15 ENCOUNTER — Other Ambulatory Visit (HOSPITAL_BASED_OUTPATIENT_CLINIC_OR_DEPARTMENT_OTHER): Payer: Self-pay

## 2023-04-21 ENCOUNTER — Other Ambulatory Visit (HOSPITAL_BASED_OUTPATIENT_CLINIC_OR_DEPARTMENT_OTHER): Payer: Self-pay

## 2023-04-30 ENCOUNTER — Other Ambulatory Visit: Payer: Self-pay

## 2023-04-30 NOTE — Progress Notes (Unsigned)
   04/30/2023  Patient ID: Jeremiah Thomas, male   DOB: [birth year], 66 y.o.   MRN: [id number]  S/O Telephone visit to follow-up on control of T2DM and HTN  Diabetes Management Plan -Current medications:  Ozempic 1mg  weekly, glipizide 10mg  daily before breakfast, Humulin N 20 units at bedtime -Patient receiving Ozempic 1mg  through [facility]- enrolled through 02/28/2024 -Patient has had two dosees of Ozempic 1mg  and is tolerating well, but states FBG 60-70 with no s/sx of hypoglycemia -A1c 6.1% on 8/23 -Using Freestyle Libre3 for CGM  Hypertension -Current medications:  metoprolol 100mg  BID, losartan 25mg  daily, hydralazine 25mg  TID, amlodipine 5mg  daily -Patient checks home BP regularly and states readings are averaging 140/86 since losartan 25mg  started approximately 2 months ago  A/P  Diabetes Management Plan -Currently controlled with A1c<7% -Stop glipizide to prevent further hypoglycemia -Recommend increasing Ozempic to 2mg  in another 2-4 weeks if patient continues to tolerate well.  Will need to decrease/potentially hold insulin based on BG readings -Patient sees PCP 11/20 and is due for A1c  Hypertension -Currently moderately controlled -Continue to monitor home BP and record values -Consider increasing losartan to 50mg  if BP remains >/=140/90 as long as K within normal limits and no increase in Scr from baseline  Follow-up:  12/13  Lenna Gilford, PharmD, DPLA

## 2023-05-05 ENCOUNTER — Encounter: Payer: Self-pay | Admitting: Physician Assistant

## 2023-05-05 ENCOUNTER — Ambulatory Visit (INDEPENDENT_AMBULATORY_CARE_PROVIDER_SITE_OTHER): Payer: PPO | Admitting: Physician Assistant

## 2023-05-05 VITALS — BP 156/78 | HR 91 | Ht 74.0 in | Wt 315.0 lb

## 2023-05-05 DIAGNOSIS — R42 Dizziness and giddiness: Secondary | ICD-10-CM

## 2023-05-05 DIAGNOSIS — I10 Essential (primary) hypertension: Secondary | ICD-10-CM

## 2023-05-05 DIAGNOSIS — N184 Chronic kidney disease, stage 4 (severe): Secondary | ICD-10-CM | POA: Diagnosis not present

## 2023-05-05 DIAGNOSIS — R251 Tremor, unspecified: Secondary | ICD-10-CM | POA: Insufficient documentation

## 2023-05-05 DIAGNOSIS — N183 Chronic kidney disease, stage 3 unspecified: Secondary | ICD-10-CM

## 2023-05-05 DIAGNOSIS — E162 Hypoglycemia, unspecified: Secondary | ICD-10-CM | POA: Diagnosis not present

## 2023-05-05 DIAGNOSIS — Z794 Long term (current) use of insulin: Secondary | ICD-10-CM

## 2023-05-05 DIAGNOSIS — G252 Other specified forms of tremor: Secondary | ICD-10-CM

## 2023-05-05 DIAGNOSIS — E1122 Type 2 diabetes mellitus with diabetic chronic kidney disease: Secondary | ICD-10-CM

## 2023-05-05 LAB — POCT GLYCOSYLATED HEMOGLOBIN (HGB A1C): Hemoglobin A1C: 6.8 % — AB (ref 4.0–5.6)

## 2023-05-05 MED ORDER — OZEMPIC (2 MG/DOSE) 8 MG/3ML ~~LOC~~ SOPN: PEN_INJECTOR | SUBCUTANEOUS | 3 refills | Status: DC

## 2023-05-05 NOTE — Progress Notes (Signed)
Established Patient Office Visit  Subjective   Patient ID: Jeremiah Thomas, male    DOB: 1955/10/17  Age: 67 y.o. MRN: [id number]  Chief Complaint  Patient presents with   Medical Management of Chronic Issues    Last A1c 6.1,tbd , medication side effects from cymbalta    HPI Patient presents today for DM and BP follow up. His last A1c was 6.1, Last visit his insulin dosage was decreased to 20 mg at night. He feels that is managing his blood glucose well.   Patient's BP today was elevated. Patient reports dizziness when he changes positions.  He just starting wearing compression stockings. Denies CP and SOB.   Patient also reports shaking/tremors. PMH of seizure but no on medications. He believes it is from his Duloxetine due to improvement of symptoms with cessation of Duloxetine. His wife reports his entire body shakes. He has fallen 3x in the past 3 months due to this shaking. Yesterday, he fell in his bathroom and experienced incontinence. Shaking does not occur at rest. He has a bilateral resting hand tremor that has worsened. He can eat fine. Denies leg weakness. He thinks he has a neuro appt 05/10/23.   .. Active Ambulatory Problems    Diagnosis Date Noted   History of stroke 05/20/2016   Asymptomatic hypertensive urgency 05/20/2016   Acute kidney injury (HCC) 06/02/2016   Essential hypertension, benign 06/02/2016   CKD (chronic kidney disease) stage 3, GFR 30-59 ml/min (HCC) 06/03/2016   Frequent urination at night 05/13/2017   Elevated PSA 07/20/2017   Elevated fasting glucose 07/20/2017   Nasal congestion 08/15/2017   Class 3 severe obesity due to excess calories with serious comorbidity and body mass index (BMI) of 40.0 to 44.9 in adult (HCC) 09/08/2017   Type II diabetes mellitus, uncontrolled 09/13/2017   Bilateral leg edema 02/21/2018   Craving for particular food 02/21/2018   Newly recognized heart murmur 02/21/2018   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   Non-restorative  sleep 06/13/2018   Snoring 06/13/2018   No energy 06/13/2018   OSA (obstructive sleep apnea) 08/22/2018   Paresthesia 09/12/2018   Dizziness 09/12/2018   Orthostatic hypotension 12/07/2018   Pneumonia due to COVID-19 virus 08/25/2019   Elevated lipase 08/25/2019   Low TSH level 08/25/2019   Lower GI bleed 08/25/2019   Serum albumin decreased 08/25/2019   Anemia 08/25/2019   History of COVID-19 09/20/2019   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, without long-term current use of insulin (HCC) 09/20/2019   Depressed mood 09/20/2019   Former smoker 12/19/2019   Benign prostatic hyperplasia without lower urinary tract symptoms 12/19/2019   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   Hypertension goal BP (blood pressure) < 140/90    Congestive heart failure (CHF) (HCC) 12/02/2020   CKD (chronic kidney disease) stage 4, GFR 15-29 ml/min (HCC) 01/31/2021   AV block, 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   RBBB (right bundle branch block) 01/31/2021   Resting tremor 02/03/2021   Chronic diastolic congestive heart failure (HCC) 03/03/2021   Hyperlipidemia associated with type 2 diabetes mellitus (HCC) 04/30/2021   Positive colorectal cancer screening using Cologuard test 08/05/2021   Shortness of breath 01/30/2022   Uncontrolled type 2 diabetes mellitus with hyperglycemia (HCC) 01/30/2022   Near syncope 05/11/2022   Overgrown toenails 08/10/2022   Diabetic peripheral neuropathy associated with type 2 diabetes mellitus (HCC) 08/10/2022   Chronic obstructive pulmonary disease (HCC) 11/12/2022   H/O prostate cancer 06/23/2019  Obesity (BMI 30-39.9) 06/23/2019   Hypoglycemia 02/17/2023   Episode of shaking 05/05/2023   Resolved Ambulatory Problems    Diagnosis Date Noted   Prostate cancer (HCC) 10/22/2017   Chronic respiratory failure with hypoxia (HCC) 08/25/2019   Bilateral pulmonary embolism (HCC) 08/25/2019   Malnutrition of  mild degree (HCC) 08/25/2019   Past Medical History:  Diagnosis Date   Hypertension    Lightheaded 09/12/2018   Morbidly obese (HCC) 09/08/2017     Review of Systems  Respiratory:  Negative for shortness of breath.   Cardiovascular:  Negative for chest pain.  Neurological:  Positive for dizziness and tremors. Negative for weakness.      Objective:     BP (!) 156/78 (BP Location: Right Arm)   Pulse 91   Ht 6\' 2"  (1.88 m)   Wt (!) 315 lb (142.9 kg)   SpO2 99%   BMI 40.44 kg/m  BP Readings from Last 3 Encounters:  05/05/23 (!) 156/78  03/05/23 (!) 142/80  02/05/23 (!) 146/68      Physical Exam Constitutional:      Appearance: Normal appearance. He is obese.  HENT:     Head: Normocephalic.  Cardiovascular:     Rate and Rhythm: Normal rate and regular rhythm.     Pulses: Normal pulses.     Heart sounds: Normal heart sounds.  Pulmonary:     Breath sounds: Normal breath sounds.  Musculoskeletal:     Right lower leg: Edema present.     Left lower leg: Edema present.     Comments: Stable non pitting edema of bilateral legs.   Neurological:     General: No focal deficit present.     Mental Status: He is alert and oriented to person, place, and time.     Motor: Tremor (bilateral hand tremor- scratching motion) present. No weakness.     Gait: Gait is intact.     Comments: Bilateral hand resting tremor  Psychiatric:        Mood and Affect: Mood normal.    Results for orders placed or performed in visit on 05/05/23  POCT HgB A1C  Result Value Ref Range   Hemoglobin A1C 6.8 (A) 4.0 - 5.6 %   HbA1c POC (<> result, manual entry)     HbA1c, POC (prediabetic range)     HbA1c, POC (controlled diabetic range)      The 10-year ASCVD risk score (Arnett DK, et al., 2019) is: 39.5%    Assessment & Plan:  .Marland KitchenJohn was seen today for medical management of chronic issues.  Diagnoses and all orders for this visit:  Controlled type 2 diabetes mellitus with stage 3 chronic  kidney disease, with long-term current use of insulin (HCC) -     POCT HgB A1C  CKD (chronic kidney disease) stage 4, GFR 15-29 ml/min (HCC)  Essential hypertension, benign  Hypertension goal BP (blood pressure) < 140/90  Hypoglycemia  Resting tremor  Episode of shaking  Dizziness  Other orders -     Semaglutide, 2 MG/DOSE, (OZEMPIC, 2 MG/DOSE,) 8 MG/3ML SOPN; 2mg  weekly Angie injection.    A1c increased from 6.1 to 6.8 but still at goal of < 7.0.  Had a few BG levels 65-70 but not dropping below 60. If drops below 60 often, need to consider insulin dose adjustment. D/c glipizide as per pharmacist recommendation. Increased Ozempic to 2mg  as per pharmacist recommendation. Advised pt to continue using 1 mg until the 2 mg comes in. On statin  BP not to goal of <140/90 on amlodipine, losartan, metoprolol, and hydralazine. Continue current BPs medications. Pt c/o of dizziness with standing in office today and frequently I am concerned about increasing medications Will discuss goals with pharmacist Continue using compression stockings daily and stand and change positions slowly.   Discussed with pt that tremors/shaking is not a side effect of his medications but will remain off cymbalta.  He does have resting tremor and episodes of shaking and then falling that neurology is going to evaluate at end of this month for seizure/movement disorder.    Pt declined Shingles and pneumococcal vaccinations today. Declined eye exam.  Return in about 3 months (around 08/05/2023).    Tandy Gaw, PA-C

## 2023-05-05 NOTE — Patient Instructions (Signed)
Increase Ozempic to 2mg  weekly STOP Glipizide.

## 2023-05-07 NOTE — Progress Notes (Signed)
   05/07/2023  Patient ID: Jeremiah Thomas, male   DOB: 1955/06/17, 67 y.o.   MRN: [id number]  Clinic routed request from patient's PCP, Tandy Gaw, PA, to assist with order change for Ozempic 2mg  from [facility].  I have completed the order change form and am faxing to provider to sign and fax to [facility].  Lenna Gilford, PharmD, DPLA

## 2023-05-14 ENCOUNTER — Other Ambulatory Visit: Payer: Self-pay | Admitting: Physician Assistant

## 2023-05-14 DIAGNOSIS — E1122 Type 2 diabetes mellitus with diabetic chronic kidney disease: Secondary | ICD-10-CM

## 2023-05-20 ENCOUNTER — Other Ambulatory Visit (HOSPITAL_BASED_OUTPATIENT_CLINIC_OR_DEPARTMENT_OTHER): Payer: Self-pay

## 2023-05-20 ENCOUNTER — Other Ambulatory Visit: Payer: Self-pay | Admitting: Physician Assistant

## 2023-05-20 DIAGNOSIS — E1165 Type 2 diabetes mellitus with hyperglycemia: Secondary | ICD-10-CM

## 2023-05-20 MED ORDER — INSULIN NPH (HUMAN) (ISOPHANE) 100 UNIT/ML ~~LOC~~ SUSP
25.0000 [IU] | Freq: Every day | SUBCUTANEOUS | 0 refills | Status: DC
  Filled 2023-05-20: qty 30, 90d supply, fill #0

## 2023-05-21 ENCOUNTER — Other Ambulatory Visit (HOSPITAL_BASED_OUTPATIENT_CLINIC_OR_DEPARTMENT_OTHER): Payer: Self-pay

## 2023-05-25 ENCOUNTER — Other Ambulatory Visit: Payer: Self-pay | Admitting: Physician Assistant

## 2023-05-28 ENCOUNTER — Other Ambulatory Visit: Payer: Self-pay

## 2023-05-28 NOTE — Progress Notes (Signed)
   05/28/2023  Patient ID: Jeremiah Thomas, male   DOB: September 25, 1955, 67 y.o.   MRN: [id number]  S/O Telephone visit to follow-up on management of diabetes and hypertension  Diabetes -Current medications:  Ozempic 1mg  weekly, Humulin N 20 units at bedtime -Patient using Libre 3 for CGM -Recent A1c 11/20 increased slightly from 6.3 to 6.8% -FBG values provided:  107, 69, 68 -Patient does not endorse any s/sx of hypoglycemia even with some values <70 -Enrolled in [facility] for Ozempic through 02/28/2024- Order change for Ozempic 2mg  was recently sent in, but this strength has not yet been received by the patient.  He has 2 more 1mg  pens on hand.  Hypertension -Current medications:  metoprolol 100mg  BID, losartan 25mg  daily, hydralazine 25mg  TID, amlodipine 5mg  daily -Patient monitors home BP regularly and states SBP ranging 140-160 and DBP 80-90 -He does endorse dizziness which he states does not just seem to be related to postural changes   A/P  Diabetes -Controlled with A1c<7% -Recommend that patient further decrease Humulin to 18 units nightly to try to get FBG 70-130 -Contacted [facility], and they state the order change form for the 2mg  Ozempic did not come through clearly, and they are needing this to be resent.  Coordinating with PCP to get this faxed in. -Patient will continue Ozempic 1mg  weekly until 2mg  received from PAP  Hypertension -Uncontrolled -Continue to monitor and record regularly -Hesitant to increase BP medications based on dizziness, but uncertain this is related to patient's blood pressure -Will continue to monitor and evaluate need for any BP medication dose changes  Follow-up:  Will monitor progress of Ozempic dose change and schedule follow-up around the time patient will receive 2mg  pens  Lenna Gilford, PharmD, DPLA

## 2023-06-04 ENCOUNTER — Other Ambulatory Visit (HOSPITAL_BASED_OUTPATIENT_CLINIC_OR_DEPARTMENT_OTHER): Payer: Self-pay

## 2023-06-04 ENCOUNTER — Telehealth: Payer: Self-pay

## 2023-06-04 ENCOUNTER — Other Ambulatory Visit: Payer: Self-pay

## 2023-06-04 DIAGNOSIS — E1122 Type 2 diabetes mellitus with diabetic chronic kidney disease: Secondary | ICD-10-CM

## 2023-06-04 MED ORDER — "BD VEO INSULIN SYR U/F 1/2UNIT 31G X 15/64"" 0.3 ML MISC": 1.0000 | Freq: Every day | 2 refills | Status: DC

## 2023-06-04 NOTE — Telephone Encounter (Signed)
Copied from CRM [id number]. Topic: Clinical - Prescription Issue >> Jun 04, 2023 11:45 AM Gildardo Pounds wrote: Reason for CRM: Patient states that [facility] said they have not received the request for a refill on patient's BD VEO INSULIN SYR U/F 1/2UNIT 31G X 15/64" 0.3 ML MISC. Request shows processed on 06/03/2023. Patient advised. Please update patient once the request has been processed if not complete. Patient is out of syringes. Callback number is [phone].

## 2023-06-04 NOTE — Telephone Encounter (Signed)
Spoke with pharmacy- did not receive prescription written yesterday.  Script resent to pharmacy .

## 2023-06-11 NOTE — Progress Notes (Signed)
   06/11/2023  Patient ID: Jeremiah Thomas, male   DOB: 1955/10/15, 67 y.o.   MRN: [id number]  Contacted [facility] to check on processing/filling status of Ozempic 2mg  (dose increase).  They did receive the order change form, and this dose went into processing 12/21.  Medication should arrive 10-14 business days from that day.  Contacted patient to make him aware and schedule a follow-up in 4-6 weeks to check on control of DM.  Lenna Gilford, PharmD, DPLA

## 2023-06-24 ENCOUNTER — Other Ambulatory Visit (HOSPITAL_BASED_OUTPATIENT_CLINIC_OR_DEPARTMENT_OTHER): Payer: Self-pay

## 2023-06-24 ENCOUNTER — Other Ambulatory Visit: Payer: Self-pay | Admitting: Physician Assistant

## 2023-06-24 DIAGNOSIS — I10 Essential (primary) hypertension: Secondary | ICD-10-CM

## 2023-06-24 DIAGNOSIS — N184 Chronic kidney disease, stage 4 (severe): Secondary | ICD-10-CM

## 2023-06-24 DIAGNOSIS — B351 Tinea unguium: Secondary | ICD-10-CM

## 2023-06-25 ENCOUNTER — Other Ambulatory Visit (HOSPITAL_BASED_OUTPATIENT_CLINIC_OR_DEPARTMENT_OTHER): Payer: Self-pay

## 2023-06-25 ENCOUNTER — Telehealth: Payer: Self-pay

## 2023-06-25 MED ORDER — TERBINAFINE HCL 250 MG PO TABS
250.0000 mg | ORAL_TABLET | Freq: Every day | ORAL | 0 refills | Status: DC
  Filled 2023-06-25: qty 90, 90d supply, fill #0

## 2023-06-25 MED ORDER — LOSARTAN POTASSIUM 25 MG PO TABS
25.0000 mg | ORAL_TABLET | Freq: Every day | ORAL | 0 refills | Status: DC
  Filled 2023-06-25: qty 90, 90d supply, fill #0

## 2023-06-25 NOTE — Telephone Encounter (Signed)
 Forwarding to [facility] as an FINANCIAL PLANNER.  [facility]  [facility] P[facility] shipment for Ozempic  1 mg dose / 4 boxes received this morning. Please contact the patient to come and pick up their order today. Placed in the PAP fridge with patient identifier. Thanks in advance.   NDC: 9830-5869-86 LOT: [id number] EXP: 2025-12-12

## 2023-06-26 ENCOUNTER — Other Ambulatory Visit: Payer: Self-pay | Admitting: Physician Assistant

## 2023-06-26 DIAGNOSIS — K219 Gastro-esophageal reflux disease without esophagitis: Secondary | ICD-10-CM

## 2023-06-28 ENCOUNTER — Other Ambulatory Visit: Payer: Self-pay

## 2023-07-07 ENCOUNTER — Telehealth: Payer: Self-pay

## 2023-07-07 NOTE — Telephone Encounter (Signed)
Forwarding to [facility] as an Financial planner.   Keyairea   [facility] PAP shipment for Ozempic 2 mg dose / 4 boxes received this morning. Please contact the patient to come and pick up their order today. Placed in the PAP fridge with patient identifier. Thanks in advance.   NDC: 1610-9604-54 LOT: [id number] EXP: 2026-01-12  Francesco Runner - please review previous msg on file.

## 2023-07-07 NOTE — Telephone Encounter (Signed)
New shipment received for 2 mg. Awaiting from a response from [facility] whether the 1 mg and 2 mg should be given to the patient. Please hold request until clarification is provided. Thanks in advance.

## 2023-07-07 NOTE — Telephone Encounter (Signed)
Francesco Runner, please review the following note from [facility] regarding the Ozempic 1 mg rx:  Thank you for making me aware of this.  Yes, the patient has been increased to the 2 mg dose.  Based on my experience, they will not allow returns to the assistance program.  I know there are delays on shipping for some patients' Ozempic from the program; so I would keep those on hand for another patient.  Thanks again!   Lenna Gilford, PharmD, DPLA   Please let the patient know we have Ozempic 2 mg available for pick up. Thanks in advance.

## 2023-07-19 ENCOUNTER — Other Ambulatory Visit: Payer: Self-pay | Admitting: Physician Assistant

## 2023-07-19 ENCOUNTER — Other Ambulatory Visit (HOSPITAL_BASED_OUTPATIENT_CLINIC_OR_DEPARTMENT_OTHER): Payer: Self-pay

## 2023-07-19 DIAGNOSIS — D126 Benign neoplasm of colon, unspecified: Secondary | ICD-10-CM | POA: Diagnosis not present

## 2023-07-19 DIAGNOSIS — R4589 Other symptoms and signs involving emotional state: Secondary | ICD-10-CM

## 2023-07-19 DIAGNOSIS — E1169 Type 2 diabetes mellitus with other specified complication: Secondary | ICD-10-CM | POA: Diagnosis not present

## 2023-07-19 DIAGNOSIS — E119 Type 2 diabetes mellitus without complications: Secondary | ICD-10-CM | POA: Diagnosis not present

## 2023-07-19 DIAGNOSIS — E039 Hypothyroidism, unspecified: Secondary | ICD-10-CM | POA: Diagnosis not present

## 2023-07-19 DIAGNOSIS — E785 Hyperlipidemia, unspecified: Secondary | ICD-10-CM | POA: Diagnosis not present

## 2023-07-19 DIAGNOSIS — I10 Essential (primary) hypertension: Secondary | ICD-10-CM

## 2023-07-19 DIAGNOSIS — Z6841 Body Mass Index (BMI) 40.0 and over, adult: Secondary | ICD-10-CM | POA: Diagnosis not present

## 2023-07-19 DIAGNOSIS — R569 Unspecified convulsions: Secondary | ICD-10-CM | POA: Diagnosis not present

## 2023-07-20 ENCOUNTER — Other Ambulatory Visit: Payer: Self-pay

## 2023-07-20 NOTE — Progress Notes (Signed)
   07/20/2023  Patient ID: Norleen Hint, male   DOB: May 17, 1956, 68 y.o.   MRN: [id number]  Subjective/Objective Telephone visit to follow-up on management of type 2 diabetes  Diabetes management plan -Current medications: Ozempic  1 mg weekly, Humulin  N 18 units at bedtime -Patient is using freestyle libre 3 for CGM -Endorses fasting blood glucose values averaging 100 -Patient's last A1c was 6.8% in November -Ozempic  2 mg arrived at PCP office in January, but patient states he was not aware and has not picked this dose up -Patient does not endorse any signs or symptoms of hypoglycemia  Assessment/Plan  Diabetes management plan -Currently controlled -Patient plans to pick Ozempic  2 mg up from [facility] this week and will be due for his next dose Saturday -Increase Ozempic  to 2 mg weekly starting Saturday, and decrease Humulin  in by 20% (use 14 units at bedtime moving forward) -Continue to use libre 3 for CGM -Patient sees PCP again 2/21 and will be due for follow-up A1c  Follow-up: 4 weeks  [name], PharmD, DPLA

## 2023-07-22 DIAGNOSIS — R569 Unspecified convulsions: Secondary | ICD-10-CM | POA: Diagnosis not present

## 2023-08-06 ENCOUNTER — Encounter: Payer: Self-pay | Admitting: Physician Assistant

## 2023-08-06 ENCOUNTER — Ambulatory Visit: Payer: PPO | Admitting: Physician Assistant

## 2023-08-06 VITALS — BP 124/84 | HR 83 | Ht 74.0 in | Wt 313.2 lb

## 2023-08-06 DIAGNOSIS — I951 Orthostatic hypotension: Secondary | ICD-10-CM

## 2023-08-06 DIAGNOSIS — N183 Chronic kidney disease, stage 3 unspecified: Secondary | ICD-10-CM | POA: Diagnosis not present

## 2023-08-06 DIAGNOSIS — N184 Chronic kidney disease, stage 4 (severe): Secondary | ICD-10-CM | POA: Diagnosis not present

## 2023-08-06 DIAGNOSIS — E1122 Type 2 diabetes mellitus with diabetic chronic kidney disease: Secondary | ICD-10-CM

## 2023-08-06 DIAGNOSIS — I10 Essential (primary) hypertension: Secondary | ICD-10-CM

## 2023-08-06 DIAGNOSIS — Z794 Long term (current) use of insulin: Secondary | ICD-10-CM | POA: Diagnosis not present

## 2023-08-06 LAB — POCT GLYCOSYLATED HEMOGLOBIN (HGB A1C): Hemoglobin A1C: 6.8 % — AB (ref 4.0–5.6)

## 2023-08-06 NOTE — Progress Notes (Signed)
 Established Patient Office Visit  Subjective   Patient ID: Jeremiah Thomas, male    DOB: 1956-03-03  Age: 68 y.o. MRN: [id number]  CC: Medication refills and T2DM follow up  HPI Patient is a 68 yo male who presents today for T2DM and BP follow up. His last A1C: 6.8%. He denies any hypoglycemic episodes. He is monitoring his blood sugars at home.  He runs 60-200s. He is taking insulin 14 units at bedtime and Semaglutide 2mg  once weekly.   He is taking amlodipine, losartan, metoprolol, and hydralazine for his blood pressure. He is still experiencing dizziness when he stands. He continues to have a falls. He has fallen about 5 times in the last 3 months.   He continues to report shaking and tremors. He was not evaluated by neurology.    .. Active Ambulatory Problems    Diagnosis Date Noted   History of stroke 05/20/2016   Asymptomatic hypertensive urgency 05/20/2016   Acute kidney injury (HCC) 06/02/2016   Essential hypertension, benign 06/02/2016   CKD (chronic kidney disease) stage 3, GFR 30-59 ml/min (HCC) 06/03/2016   Frequent urination at night 05/13/2017   Elevated PSA 07/20/2017   Elevated fasting glucose 07/20/2017   Nasal congestion 08/15/2017   Class 3 severe obesity due to excess calories with serious comorbidity and body mass index (BMI) of 40.0 to 44.9 in adult (HCC) 09/08/2017   Type II diabetes mellitus, uncontrolled 09/13/2017   Bilateral leg edema 02/21/2018   Craving for particular food 02/21/2018   Newly recognized heart murmur 02/21/2018   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   Non-restorative sleep 06/13/2018   Snoring 06/13/2018   No energy 06/13/2018   OSA (obstructive sleep apnea) 08/22/2018   Paresthesia 09/12/2018   Dizziness 09/12/2018   Orthostatic hypotension 12/07/2018   Pneumonia due to COVID-19 virus 08/25/2019   Elevated lipase 08/25/2019   Low TSH level 08/25/2019   Lower GI bleed 08/25/2019   Serum albumin decreased 08/25/2019   Anemia 08/25/2019    History of COVID-19 09/20/2019   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, without long-term current use of insulin (HCC) 09/20/2019   Depressed mood 09/20/2019   Former smoker 12/19/2019   Benign prostatic hyperplasia without lower urinary tract symptoms 12/19/2019   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   Hypertension goal BP (blood pressure) < 140/90    Congestive heart failure (CHF) (HCC) 12/02/2020   CKD (chronic kidney disease) stage 4, GFR 15-29 ml/min (HCC) 01/31/2021   AV block, 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   RBBB (right bundle branch block) 01/31/2021   Resting tremor 02/03/2021   Chronic diastolic congestive heart failure (HCC) 03/03/2021   Hyperlipidemia associated with type 2 diabetes mellitus (HCC) 04/30/2021   Positive colorectal cancer screening using Cologuard test 08/05/2021   Shortness of breath 01/30/2022   Uncontrolled type 2 diabetes mellitus with hyperglycemia (HCC) 01/30/2022   Near syncope 05/11/2022   Overgrown toenails 08/10/2022   Diabetic peripheral neuropathy associated with type 2 diabetes mellitus (HCC) 08/10/2022   Chronic obstructive pulmonary disease (HCC) 11/12/2022   H/O prostate cancer 06/23/2019   Obesity (BMI 30-39.9) 06/23/2019   Hypoglycemia 02/17/2023   Episode of shaking 05/05/2023   Controlled type 2 diabetes mellitus with stage 3 chronic kidney disease, with long-term current use of insulin (HCC) 08/06/2023   Resolved Ambulatory Problems    Diagnosis Date Noted   Prostate cancer (HCC) 10/22/2017   Chronic respiratory failure with hypoxia (HCC) 08/25/2019   Bilateral pulmonary  embolism (HCC) 08/25/2019   Malnutrition of mild degree (HCC) 08/25/2019   Past Medical History:  Diagnosis Date   Hypertension    Lightheaded 09/12/2018   Morbidly obese (HCC) 09/08/2017    ROS    Objective:     BP 124/84 (BP Location: Left Arm, Patient Position: Sitting, Cuff Size: Large)    Pulse 83   Ht 6\' 2"  (1.88 m)   Wt (!) 313 lb 4 oz (142.1 kg)   SpO2 94%   BMI 40.22 kg/m  BP Readings from Last 3 Encounters:  08/06/23 124/84  05/05/23 (!) 156/78  03/05/23 (!) 142/80   Wt Readings from Last 3 Encounters:  08/06/23 (!) 313 lb 4 oz (142.1 kg)  05/05/23 (!) 315 lb (142.9 kg)  03/05/23 (!) 325 lb 4 oz (147.5 kg)    .Marland Kitchen Results for orders placed or performed in visit on 08/06/23  POCT HgB A1C   Collection Time: 08/06/23  8:37 AM  Result Value Ref Range   Hemoglobin A1C 6.8 (A) 4.0 - 5.6 %   HbA1c POC (<> result, manual entry)     HbA1c, POC (prediabetic range)     HbA1c, POC (controlled diabetic range)       Physical Exam Constitutional:      Appearance: Normal appearance. He is obese.  HENT:     Head: Normocephalic.  Eyes:     Conjunctiva/sclera: Conjunctivae normal.  Cardiovascular:     Rate and Rhythm: Normal rate.     Heart sounds: Murmur heard.  Pulmonary:     Effort: Pulmonary effort is normal.     Breath sounds: Normal breath sounds.  Musculoskeletal:     Cervical back: Normal range of motion and neck supple. No tenderness.     Right lower leg: Edema present.     Left lower leg: Edema present.     Comments: 1+pitting edema bilateral legs  Lymphadenopathy:     Cervical: No cervical adenopathy.  Neurological:     General: No focal deficit present.     Mental Status: He is alert and oriented to person, place, and time.  Psychiatric:        Mood and Affect: Mood normal.        The 10-year ASCVD risk score (Arnett DK, et al., 2019) is: 27.6%    Assessment & Plan:  .Marland KitchenJohn was seen today for medical management of chronic issues.  Diagnoses and all orders for this visit:  Controlled type 2 diabetes mellitus with stage 3 chronic kidney disease, with long-term current use of insulin (HCC) -     POCT HgB A1C  Hypertension goal BP (blood pressure) < 140/90  CKD (chronic kidney disease) stage 4, GFR 15-29 ml/min (HCC)  Orthostatic  hypotension   A1C to goal  Reporting sugars in the 60's at night Decrease insulin at bedtime by 2 units to 12 units discussed with patient if still having sugars under 90's at night can decrease by another 2 units.  Continue ozempic weekly Encouraged patient to get eye exam BP looks good today but c/o problems with falls and orthostatic hypotension Will send message to cardiology to see if they suggest any medication changes Strongly recommend compression stockings Pt declines all vaccines today.     Tandy Gaw, PA-C

## 2023-08-06 NOTE — Patient Instructions (Addendum)
 Wear compression stockings daily!   Orthostatic Hypotension Blood pressure is a measurement of how strongly, or weakly, your circulating blood is pressing against the walls of your arteries. Orthostatic hypotension is a drop in blood pressure that can happen when you change positions, such as when you go from lying down to standing. Arteries are blood vessels that carry blood from your heart throughout your body. When blood pressure is too low, you may not get enough blood to your brain or to the rest of your organs. Orthostatic hypotension can cause light-headedness, sweating, rapid heartbeat, blurred vision, and fainting. These symptoms require further investigation into the cause. What are the causes? Orthostatic hypotension can be caused by many things, including: Sudden changes in posture, such as standing up quickly after you have been sitting or lying down. Loss of blood (anemia) or loss of body fluids (dehydration). Heart problems, neurologic problems, or hormone problems. Pregnancy. Aging. The risk for this condition increases as you get older. Severe infection (sepsis). Certain medicines, such as medicines for high blood pressure or medicines that make the body lose excess fluids (diuretics). What are the signs or symptoms? Symptoms of this condition may include: Weakness, light-headedness, or dizziness. Sweating. Blurred vision. Tiredness (fatigue). Rapid heartbeat. Fainting, in severe cases. How is this diagnosed? This condition is diagnosed based on: Your symptoms and medical history. Your blood pressure measurements. Your health care provider will check your blood pressure when you are: Lying down. Sitting. Standing. A blood pressure reading is recorded as two numbers, such as "120 over 80" (or 120/80). The first ("top") number is called the systolic pressure. It is a measure of the pressure in your arteries as your heart beats. The second ("bottom") number is called the  diastolic pressure. It is a measure of the pressure in your arteries when your heart relaxes between beats. Blood pressure is measured in a unit called mmHg. Healthy blood pressure for most adults is 120/80 mmHg. Orthostatic hypotension is defined as a 20 mmHg drop in systolic pressure or a 10 mmHg drop in diastolic pressure within 3 minutes of standing. Other information or tests that may be used to diagnose orthostatic hypotension include: Your other vital signs, such as your heart rate and temperature. Blood tests. An electrocardiogram (ECG) or echocardiogram. A Holter monitor. This is a device you wear that records your heart rhythm continuously, usually for 24-48 hours. Tilt table test. For this test, you will be safely secured to a table that moves you from a lying position to an upright position. Your heart rhythm and blood pressure will be monitored during the test. How is this treated? This condition may be treated by: Changing your diet. This may involve eating more salt (sodium) or drinking more water. Changing the dosage of certain medicines you are taking that might be lowering your blood pressure. Correcting the underlying reason for the orthostatic hypotension. Wearing compression stockings. Taking medicines to raise your blood pressure. Avoiding actions that trigger symptoms. Follow these instructions at home: Medicines Take over-the-counter and prescription medicines only as told by your health care provider. Follow instructions from your health care provider about changing the dosage of your current medicines, if this applies. Do not stop or adjust any of your medicines on your own. Eating and drinking  Drink enough fluid to keep your urine pale yellow. Eat extra salt only as directed. Do not add extra salt to your diet unless advised by your health care provider. Eat frequent, small meals. Avoid standing up  suddenly after eating. General instructions  Get up slowly from  lying down or sitting positions. This gives your blood pressure a chance to adjust. Avoid hot showers and excessive heat as directed by your health care provider. Engage in regular physical activity as directed by your health care provider. If you have compression stockings, wear them as told. Keep all follow-up visits. This is important. Contact a health care provider if: You have a fever for more than 2-3 days. You feel more thirsty than usual. You feel dizzy or weak. Get help right away if: You have chest pain. You have a fast or irregular heartbeat. You become sweaty or feel light-headed. You feel short of breath. You faint. You have any symptoms of a stroke. "BE FAST" is an easy way to remember the main warning signs of a stroke: B - Balance. Signs are dizziness, sudden trouble walking, or loss of balance. E - Eyes. Signs are trouble seeing or a sudden change in vision. F - Face. Signs are sudden weakness or numbness of the face, or the face or eyelid drooping on one side. A - Arms. Signs are weakness or numbness in an arm. This happens suddenly and usually on one side of the body. S - Speech. Signs are sudden trouble speaking, slurred speech, or trouble understanding what people say. T - Time. Time to call emergency services. Write down what time symptoms started. You have other signs of a stroke, such as: A sudden, severe headache with no known cause. Nausea or vomiting. Seizure. These symptoms may represent a serious problem that is an emergency. Do not wait to see if the symptoms will go away. Get medical help right away. Call your local emergency services (911 in the [place] Do not drive yourself to the hospital. Summary Orthostatic hypotension is a sudden drop in blood pressure. It can cause light-headedness, sweating, rapid heartbeat, blurred vision, and fainting. Orthostatic hypotension can be diagnosed by having your blood pressure taken while lying down, sitting, and then  standing. Treatment may involve changing your diet, wearing compression stockings, sitting up slowly, adjusting your medicines, or correcting the underlying reason for the orthostatic hypotension. Get help right away if you have chest pain, a fast or irregular heartbeat, or symptoms of a stroke. This information is not intended to replace advice given to you by your health care provider. Make sure you discuss any questions you have with your health care provider. Document Revised: 08/15/2020 Document Reviewed: 08/15/2020 Elsevier Patient Education  2024 [employer].

## 2023-08-09 DIAGNOSIS — E119 Type 2 diabetes mellitus without complications: Secondary | ICD-10-CM | POA: Diagnosis not present

## 2023-08-09 DIAGNOSIS — Z6841 Body Mass Index (BMI) 40.0 and over, adult: Secondary | ICD-10-CM | POA: Diagnosis not present

## 2023-08-09 DIAGNOSIS — I10 Essential (primary) hypertension: Secondary | ICD-10-CM | POA: Diagnosis not present

## 2023-08-09 DIAGNOSIS — Z8673 Personal history of transient ischemic attack (TIA), and cerebral infarction without residual deficits: Secondary | ICD-10-CM | POA: Diagnosis not present

## 2023-08-09 DIAGNOSIS — R569 Unspecified convulsions: Secondary | ICD-10-CM | POA: Diagnosis not present

## 2023-08-09 DIAGNOSIS — Z79899 Other long term (current) drug therapy: Secondary | ICD-10-CM | POA: Diagnosis not present

## 2023-08-09 LAB — LAB REPORT - SCANNED: EGFR: 27

## 2023-08-17 ENCOUNTER — Other Ambulatory Visit: Payer: Self-pay

## 2023-08-17 NOTE — Progress Notes (Unsigned)
   08/17/2023  Patient ID: Jeremiah Thomas, male   DOB: 1955-07-27, 68 y.o.   MRN: [id number]  Outreach attempt for scheduled telephone visit was unsuccessful, but I was able to leave HIPAA compliant voicemail with my direct phone number.  If I do not hear back from patient, I will attempt to call him again next week.  Lenna Gilford, PharmD, DPLA

## 2023-08-23 ENCOUNTER — Other Ambulatory Visit: Payer: Self-pay

## 2023-08-23 NOTE — Progress Notes (Signed)
   08/23/2023  Patient ID: Jeremiah Thomas, male   DOB: 19-Nov-1955, 68 y.o.   MRN: [id number]   Subjective/Objective Telephone follow-up visit to check on management of diabetes  Diabetes Management Plan -Current medications: Humulin N and 16 units daily, Ozempic 2 mg weekly -Patient was recently instructed by PCP to decrease Humulin to 12 units at bedtime based on hypoglycemia with blood glucose readings in the 60s.  Patient endorses that he has been using 16 units, though. -Using libre 3 for CGM and does report continued instances of blood glucose readings in the 60s at times; however, he is also experiencing readings in the 250s at times.  Patient states highs and lows will occur throughout the day, with no consistency noted.  Assessment/Plan  Diabetes Management Plan -I recommend changing patient to [place], as this medication is also covered on patient's plan; and it does not have the peak effect and is longer lasting than Humulin N -Patient is open to this change and would like pen formulation and pen needles versus current vial and insulin syringes -Coordinating with patient's PCP to see if she is in agreement  Follow-up: Will schedule appropriately based on any medication changes  Lenna Gilford, PharmD, DPLA

## 2023-08-27 ENCOUNTER — Other Ambulatory Visit: Payer: Self-pay | Admitting: Physician Assistant

## 2023-08-27 ENCOUNTER — Other Ambulatory Visit (HOSPITAL_BASED_OUTPATIENT_CLINIC_OR_DEPARTMENT_OTHER): Payer: Self-pay

## 2023-08-27 DIAGNOSIS — E785 Hyperlipidemia, unspecified: Secondary | ICD-10-CM

## 2023-08-27 DIAGNOSIS — E1169 Type 2 diabetes mellitus with other specified complication: Secondary | ICD-10-CM

## 2023-08-27 MED ORDER — ATORVASTATIN CALCIUM 80 MG PO TABS
80.0000 mg | ORAL_TABLET | Freq: Every day | ORAL | 3 refills | Status: DC
  Filled 2023-08-27: qty 90, 90d supply, fill #0

## 2023-09-06 ENCOUNTER — Other Ambulatory Visit (HOSPITAL_BASED_OUTPATIENT_CLINIC_OR_DEPARTMENT_OTHER): Payer: Self-pay

## 2023-09-07 ENCOUNTER — Other Ambulatory Visit (HOSPITAL_BASED_OUTPATIENT_CLINIC_OR_DEPARTMENT_OTHER): Payer: Self-pay

## 2023-09-07 MED ORDER — TRESIBA FLEXTOUCH 100 UNIT/ML ~~LOC~~ SOPN
14.0000 [IU] | PEN_INJECTOR | Freq: Every day | SUBCUTANEOUS | 2 refills | Status: DC
  Filled 2023-09-07: qty 3, 21d supply, fill #0
  Filled 2023-09-23: qty 3, 21d supply, fill #1
  Filled 2023-11-25: qty 3, 21d supply, fill #2

## 2023-09-07 NOTE — Addendum Note (Signed)
 Addended by: Jomarie Longs on: 09/07/2023 04:39 PM   Modules accepted: Orders

## 2023-09-09 ENCOUNTER — Other Ambulatory Visit: Payer: Self-pay

## 2023-09-09 NOTE — Progress Notes (Signed)
   09/09/2023  Patient ID: Jeremiah Thomas, male   DOB: 05-Mar-1956, 68 y.o.   MRN: [id number]  Patient outreach to inform patient PCP was in agreement with changing Humulin N to [place] to hopefully prevent hypoglycemia and improve overall BG control.  Patient states he will pick prescription up fro [facility], stop using Humulin N, and initiate Tresiba.  Patient will continue Ozsempic 2mg  weekly.  Obtained patient's email and am sending invite to link Libre data to [employer].  Telephone visit scheduled in 4 weeks.  Lenna Gilford, PharmD, DPLA

## 2023-09-10 ENCOUNTER — Other Ambulatory Visit (HOSPITAL_BASED_OUTPATIENT_CLINIC_OR_DEPARTMENT_OTHER): Payer: Self-pay

## 2023-09-15 NOTE — Progress Notes (Signed)
 Did not see exactly how reduction was to be made in regards to the hydralazine -  What should I relay to patient regarding this?

## 2023-09-17 ENCOUNTER — Other Ambulatory Visit (HOSPITAL_BASED_OUTPATIENT_CLINIC_OR_DEPARTMENT_OTHER): Payer: Self-pay

## 2023-09-17 ENCOUNTER — Other Ambulatory Visit: Payer: Self-pay | Admitting: Physician Assistant

## 2023-09-17 DIAGNOSIS — E1122 Type 2 diabetes mellitus with diabetic chronic kidney disease: Secondary | ICD-10-CM

## 2023-09-17 MED ORDER — BD VEO INSULIN SYR U/F 1/2UNIT 31G X 15/64" 0.3 ML MISC
1.0000 | Freq: Every day | 2 refills | Status: DC
Start: 2023-09-17 — End: 2023-10-07
  Filled 2023-09-17: qty 100, 100d supply, fill #0

## 2023-09-20 ENCOUNTER — Other Ambulatory Visit (HOSPITAL_BASED_OUTPATIENT_CLINIC_OR_DEPARTMENT_OTHER): Payer: Self-pay

## 2023-09-20 NOTE — Progress Notes (Signed)
 Patient informed  and will reduce hydralazine by 1/2  He will check BP both sitting and standing and will let Tandy Gaw, PA know these results.

## 2023-09-23 ENCOUNTER — Other Ambulatory Visit (HOSPITAL_BASED_OUTPATIENT_CLINIC_OR_DEPARTMENT_OTHER): Payer: Self-pay

## 2023-09-23 ENCOUNTER — Other Ambulatory Visit: Payer: Self-pay | Admitting: Physician Assistant

## 2023-09-23 DIAGNOSIS — I10 Essential (primary) hypertension: Secondary | ICD-10-CM

## 2023-09-23 DIAGNOSIS — N184 Chronic kidney disease, stage 4 (severe): Secondary | ICD-10-CM

## 2023-09-24 ENCOUNTER — Other Ambulatory Visit (HOSPITAL_BASED_OUTPATIENT_CLINIC_OR_DEPARTMENT_OTHER): Payer: Self-pay

## 2023-09-24 MED ORDER — LOSARTAN POTASSIUM 25 MG PO TABS
25.0000 mg | ORAL_TABLET | Freq: Every day | ORAL | 0 refills | Status: DC
  Filled 2023-09-24: qty 90, 90d supply, fill #0

## 2023-10-06 ENCOUNTER — Other Ambulatory Visit (HOSPITAL_BASED_OUTPATIENT_CLINIC_OR_DEPARTMENT_OTHER): Payer: Self-pay

## 2023-10-07 ENCOUNTER — Other Ambulatory Visit: Payer: Self-pay

## 2023-10-07 ENCOUNTER — Other Ambulatory Visit (HOSPITAL_BASED_OUTPATIENT_CLINIC_OR_DEPARTMENT_OTHER): Payer: Self-pay

## 2023-10-07 NOTE — Progress Notes (Unsigned)
   10/07/2023  Patient ID: Jeremiah Thomas, male   DOB: June 18, 1955, 68 y.o.   MRN: [id number]  Subjective/Objective Telephone follow-up visit to check on management of diabetes   Diabetes Management Plan -Current medications: Humulin  N 14 units daily, Ozempic  2 mg weekly -Patient was recently switched to Tresiba  14 units at bedtime versus Hunulin N, but he has continued to use Humulin  N due to not having pen needles to inject Tresiba  with -Using Libre 3 for CGM and reports FBG 61-69 and post-prandial BG 111 -Does not endorse s/sx of hypoglycemia even with these FBG values   Assessment/Plan   Diabetes Management Plan -Contacted pharmacy, and they need a prescription for pen needles- pending for PCP to sign -Stop Humulin  N, and start Tresiba  but at 10 units daily versus 14 units -Continue CGM with Libre 3 -Based on BG, may be able to completely stop Tresiba    Follow-up: Will contact patient to inform when pen needles sent and schedule f/u for 2-3 weeks   Linn Rich, PharmD, DPLA

## 2023-10-08 ENCOUNTER — Other Ambulatory Visit (HOSPITAL_BASED_OUTPATIENT_CLINIC_OR_DEPARTMENT_OTHER): Payer: Self-pay

## 2023-10-08 ENCOUNTER — Telehealth: Payer: Self-pay

## 2023-10-08 MED ORDER — BD PEN NEEDLE MINI U/F 31G X 5 MM MISC
1 refills | Status: DC
  Filled 2023-10-08: qty 100, 100d supply, fill #0

## 2023-10-08 NOTE — Progress Notes (Signed)
   10/08/2023  Patient ID: Jeremiah Thomas, male   DOB: 1956-02-05, 68 y.o.   MRN: [id number]  Patient outreach to inform Mr. Sydnor that pen needles to inject Tresiba  with are ready for pick up at [facility] outpatient pharmacy at $0 copay.  Reminded patient to decrease daily insulin  dose to 10 units nightly to prevent hypoglycemia.  Telephone follow-up scheduled for 5/14.  Linn Rich, PharmD, DPLA

## 2023-10-20 ENCOUNTER — Telehealth: Payer: Self-pay

## 2023-10-20 NOTE — Telephone Encounter (Signed)
 Attempted call to patient.  Received  ozempic  2mg /14ml   4 pens  Left detailed message on voicemail that patient can pick this up at his convenience.

## 2023-10-26 ENCOUNTER — Other Ambulatory Visit: Payer: Self-pay | Admitting: Physician Assistant

## 2023-10-26 DIAGNOSIS — I10 Essential (primary) hypertension: Secondary | ICD-10-CM

## 2023-10-27 ENCOUNTER — Other Ambulatory Visit (HOSPITAL_BASED_OUTPATIENT_CLINIC_OR_DEPARTMENT_OTHER): Payer: Self-pay

## 2023-10-27 ENCOUNTER — Other Ambulatory Visit: Payer: Self-pay

## 2023-10-27 DIAGNOSIS — R351 Nocturia: Secondary | ICD-10-CM

## 2023-10-27 DIAGNOSIS — N184 Chronic kidney disease, stage 4 (severe): Secondary | ICD-10-CM

## 2023-10-27 DIAGNOSIS — E785 Hyperlipidemia, unspecified: Secondary | ICD-10-CM

## 2023-10-27 DIAGNOSIS — I10 Essential (primary) hypertension: Secondary | ICD-10-CM

## 2023-10-27 DIAGNOSIS — C61 Malignant neoplasm of prostate: Secondary | ICD-10-CM

## 2023-10-27 DIAGNOSIS — E1122 Type 2 diabetes mellitus with diabetic chronic kidney disease: Secondary | ICD-10-CM

## 2023-10-27 MED ORDER — FINASTERIDE 5 MG PO TABS: 5.0000 mg | ORAL_TABLET | Freq: Every day | ORAL | 1 refills | Status: AC

## 2023-10-27 MED ORDER — FREESTYLE LIBRE 3 SENSOR MISC: 1 refills | Status: DC

## 2023-10-27 MED ORDER — METOPROLOL TARTRATE 100 MG PO TABS: 100.0000 mg | ORAL_TABLET | Freq: Two times a day (BID) | ORAL | 1 refills | Status: DC

## 2023-10-27 MED ORDER — ATORVASTATIN CALCIUM 80 MG PO TABS: 80.0000 mg | ORAL_TABLET | Freq: Every day | ORAL | 1 refills | Status: DC

## 2023-10-27 MED ORDER — LOSARTAN POTASSIUM 25 MG PO TABS: 25.0000 mg | ORAL_TABLET | Freq: Every day | ORAL | 1 refills | Status: DC

## 2023-10-27 NOTE — Progress Notes (Signed)
   10/27/2023  Patient ID: Jeremiah Thomas, male   DOB: 08/19/1955, 68 y.o.   MRN: [id number]  Subjective/Objective Telephone follow-up visit to check on management of diabetes   Diabetes Management Plan -Current medications: Tresiba  10 units daily, Ozempic  2 mg weekly -Using Libre 3 for CGM and reports BG readings averaging 80-90 -Does not endorse s/sx of hypoglycemia  -Received email invite to link Jerrilyn Moras data to [facility] dashboard, but needs son to assist with this -Most recent A1c 6.8% on 2/21 -Patient is currently filling medications at both [facility] and [facility] and would prefer to get everything at [facility] moving forward, as it is easier for him to get to this pharmacy   Assessment/Plan   Diabetes Management Plan -Patient sees PCP again next Friday (5/23) and will be due for A1c.  Based on result and recently reported home BG readings, I recommend stopping Tresiba  and continuing Ozempic  2mg  weekly  -Continue to use Libre 3 for CGM -Pending prescriptions for medications currently being filled at [facility] to go to [facility] for PCP to sign (since [facility] system linked to [facility], this will cancel future fills there to prevent duplications)   Follow-up: 6/23   Linn Rich, PharmD, DPLA

## 2023-11-05 ENCOUNTER — Ambulatory Visit (INDEPENDENT_AMBULATORY_CARE_PROVIDER_SITE_OTHER): Payer: PPO | Admitting: Physician Assistant

## 2023-11-05 ENCOUNTER — Encounter: Payer: Self-pay | Admitting: Physician Assistant

## 2023-11-05 VITALS — BP 128/72 | HR 76 | Ht 74.0 in | Wt 296.0 lb

## 2023-11-05 DIAGNOSIS — Z794 Long term (current) use of insulin: Secondary | ICD-10-CM | POA: Diagnosis not present

## 2023-11-05 DIAGNOSIS — I10 Essential (primary) hypertension: Secondary | ICD-10-CM | POA: Diagnosis not present

## 2023-11-05 DIAGNOSIS — J441 Chronic obstructive pulmonary disease with (acute) exacerbation: Secondary | ICD-10-CM | POA: Diagnosis not present

## 2023-11-05 DIAGNOSIS — N183 Chronic kidney disease, stage 3 unspecified: Secondary | ICD-10-CM

## 2023-11-05 DIAGNOSIS — N1832 Chronic kidney disease, stage 3b: Secondary | ICD-10-CM | POA: Diagnosis not present

## 2023-11-05 DIAGNOSIS — N184 Chronic kidney disease, stage 4 (severe): Secondary | ICD-10-CM | POA: Diagnosis not present

## 2023-11-05 DIAGNOSIS — E1122 Type 2 diabetes mellitus with diabetic chronic kidney disease: Secondary | ICD-10-CM

## 2023-11-05 LAB — POCT GLYCOSYLATED HEMOGLOBIN (HGB A1C): Hemoglobin A1C: 6 % — AB (ref 4.0–5.6)

## 2023-11-05 MED ORDER — ALBUTEROL SULFATE HFA 108 (90 BASE) MCG/ACT IN AERS
INHALATION_SPRAY | RESPIRATORY_TRACT | 2 refills | Status: DC
Start: 2023-11-05 — End: 2024-01-25

## 2023-11-05 NOTE — Patient Instructions (Signed)
 Decrease tresbia to 5 units at bedtime.  Wear compression socks!! Need eye exam, referral made.

## 2023-11-05 NOTE — Progress Notes (Unsigned)
 Established Patient Office Visit  Subjective   Patient ID: Jeremiah Thomas, male    DOB: 01-Apr-1956  Age: 68 y.o. MRN: [id number]  Chief Complaint  Patient presents with   Diabetes    HPI Pt is a 68 yo obese male who presents to the clinic for T2DM follow up.   He is compliant with medications. He has not stopped [place] and still taking 10 units once a day. He is staying in green zone on [place] [address]. Denies any hypoglycemic events. Continues to have some intermittent dizziness but overall dizziness a lot better. No significant lower extremity edema swelling.    .. Active Ambulatory Problems    Diagnosis Date Noted   History of stroke 05/20/2016   Asymptomatic hypertensive urgency 05/20/2016   Acute kidney injury (HCC) 06/02/2016   Essential hypertension, benign 06/02/2016   CKD (chronic kidney disease) stage 3, GFR 30-59 ml/min (HCC) 06/03/2016   Frequent urination at night 05/13/2017   Elevated PSA 07/20/2017   Elevated fasting glucose 07/20/2017   Nasal congestion 08/15/2017   Class 3 severe obesity due to excess calories with serious comorbidity and body mass index (BMI) of 40.0 to 44.9 in adult 09/08/2017   Type II diabetes mellitus, uncontrolled 09/13/2017   Bilateral leg edema 02/21/2018   Craving for particular food 02/21/2018   Newly recognized heart murmur 02/21/2018   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   Non-restorative sleep 06/13/2018   Snoring 06/13/2018   No energy 06/13/2018   OSA (obstructive sleep apnea) 08/22/2018   Paresthesia 09/12/2018   Dizziness 09/12/2018   Orthostatic hypotension 12/07/2018   Pneumonia due to COVID-19 virus 08/25/2019   Elevated lipase 08/25/2019   Low TSH level 08/25/2019   Lower GI bleed 08/25/2019   Serum albumin decreased 08/25/2019   Anemia 08/25/2019   History of COVID-19 09/20/2019   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, without long-term current use of insulin  (HCC) 09/20/2019   Depressed mood 09/20/2019    Former smoker 12/19/2019   Benign prostatic hyperplasia without lower urinary tract symptoms 12/19/2019   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   Hypertension goal BP (blood pressure) < 140/90    Congestive heart failure (CHF) (HCC) 12/02/2020   CKD (chronic kidney disease) stage 4, GFR 15-29 ml/min (HCC) 01/31/2021   AV block, 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   RBBB (right bundle branch block) 01/31/2021   Resting tremor 02/03/2021   Chronic diastolic congestive heart failure (HCC) 03/03/2021   Hyperlipidemia associated with type 2 diabetes mellitus (HCC) 04/30/2021   Positive colorectal cancer screening using Cologuard test 08/05/2021   Shortness of breath 01/30/2022   Uncontrolled type 2 diabetes mellitus with hyperglycemia (HCC) 01/30/2022   Near syncope 05/11/2022   Overgrown toenails 08/10/2022   Diabetic peripheral neuropathy associated with type 2 diabetes mellitus (HCC) 08/10/2022   Chronic obstructive pulmonary disease (HCC) 11/12/2022   H/O prostate cancer 06/23/2019   Obesity (BMI 30-39.9) 06/23/2019   Hypoglycemia 02/17/2023   Episode of shaking 05/05/2023   Controlled type 2 diabetes mellitus with stage 3 chronic kidney disease, with long-term current use of insulin  (HCC) 08/06/2023   Resolved Ambulatory Problems    Diagnosis Date Noted   Prostate cancer (HCC) 10/22/2017   Chronic respiratory failure with hypoxia (HCC) 08/25/2019   Bilateral pulmonary embolism (HCC) 08/25/2019   Malnutrition of mild degree (HCC) 08/25/2019   Past Medical History:  Diagnosis Date   Hypertension    Lightheaded 09/12/2018   Morbidly obese (HCC) 09/08/2017  ROS See HPI.    Objective:     BP 128/72 (BP Location: Right Arm)   Pulse 76   Ht 6\' 2"  (1.88 m)   Wt 296 lb (134.3 kg)   SpO2 99%   BMI 38.00 kg/m  BP Readings from Last 3 Encounters:  11/05/23 128/72  08/06/23 124/84  05/05/23 (!) 156/78   Wt Readings from Last 3  Encounters:  11/05/23 296 lb (134.3 kg)  08/06/23 (!) 313 lb 4 oz (142.1 kg)  05/05/23 (!) 315 lb (142.9 kg)    .Aaron Aas Results for orders placed or performed in visit on 11/05/23  POCT HgB A1C   Collection Time: 11/05/23  8:29 AM  Result Value Ref Range   Hemoglobin A1C 6.0 (A) 4.0 - 5.6 %   HbA1c POC (<> result, manual entry)     HbA1c, POC (prediabetic range)     HbA1c, POC (controlled diabetic range)       Physical Exam Constitutional:      Appearance: Normal appearance.  HENT:     Head: Normocephalic.  Cardiovascular:     Rate and Rhythm: Normal rate and regular rhythm.     Heart sounds: Murmur heard.  Pulmonary:     Effort: Pulmonary effort is normal.     Breath sounds: Normal breath sounds.  Musculoskeletal:     Right lower leg: Edema present.     Left lower leg: Edema present.     Comments: Scant pitting edema.   Neurological:     General: No focal deficit present.     Mental Status: He is alert and oriented to person, place, and time.  Psychiatric:        Mood and Affect: Mood normal.     The 10-year ASCVD risk score (Arnett DK, et al., 2019) is: 29.1%    Assessment & Plan:  .Aaron AasJohn was seen today for diabetes.  Diagnoses and all orders for this visit:  Controlled type 2 diabetes mellitus with stage 3 chronic kidney disease, with long-term current use of insulin  (HCC) -     POCT HgB A1C -     Cancel: Urine Microalbumin w/creat. ratio -     Ambulatory referral to Ophthalmology  CKD (chronic kidney disease) stage 4, GFR 15-29 ml/min (HCC) -     POCT HgB A1C -     Cancel: Urine Microalbumin w/creat. ratio  Essential hypertension, benign -     POCT HgB A1C -     Cancel: Urine Microalbumin w/creat. ratio  Stage 3b chronic kidney disease (HCC)  Chronic obstructive pulmonary disease with acute exacerbation (HCC) -     albuterol  (VENTOLIN  HFA) 108 (90 Base) MCG/ACT inhaler; INHALE 2 PUFFS INTO THE LUNGS EVERY 6 HOURS AS NEEDED FOR WHEEZING OR SHORTNESS OF  BREATH   A1C to goal Decrease trebia to 5 units and then follow up with Bartholomew Light at pharmacy.  BP better and to goal on 2nd recheck.  Referral made for eye exam.  On statin.  Encouraged to get shingles vaccine at pharmacy.  Follow up in 3 months.     Return in about 3 months (around 02/05/2024).    Siyona Coto, PA-C

## 2023-11-09 ENCOUNTER — Encounter: Payer: Self-pay | Admitting: Physician Assistant

## 2023-11-25 ENCOUNTER — Other Ambulatory Visit (HOSPITAL_BASED_OUTPATIENT_CLINIC_OR_DEPARTMENT_OTHER): Payer: Self-pay

## 2023-11-25 ENCOUNTER — Other Ambulatory Visit: Payer: Self-pay | Admitting: Physician Assistant

## 2023-11-25 DIAGNOSIS — E1122 Type 2 diabetes mellitus with diabetic chronic kidney disease: Secondary | ICD-10-CM

## 2023-11-26 ENCOUNTER — Other Ambulatory Visit: Payer: Self-pay | Admitting: Physician Assistant

## 2023-11-26 ENCOUNTER — Other Ambulatory Visit (HOSPITAL_BASED_OUTPATIENT_CLINIC_OR_DEPARTMENT_OTHER): Payer: Self-pay

## 2023-11-26 MED ORDER — FREESTYLE LIBRE 3 SENSOR MISC
1 refills | Status: DC
  Filled 2023-11-26: qty 6, 84d supply, fill #0
  Filled 2024-02-18: qty 6, 84d supply, fill #1

## 2023-11-29 ENCOUNTER — Other Ambulatory Visit (HOSPITAL_BASED_OUTPATIENT_CLINIC_OR_DEPARTMENT_OTHER): Payer: Self-pay

## 2023-12-06 ENCOUNTER — Other Ambulatory Visit: Payer: Self-pay

## 2023-12-06 NOTE — Progress Notes (Signed)
     12/06/2023   Patient ID: Norleen Hint, male   DOB: 1956-03-04, 68 y.o.   MRN: [id number]   Subjective/Objective Telephone follow-up visit to check on management of diabetes   Diabetes Management Plan -Current medications: Tresiba  5 units daily, Ozempic  2 mg weekly -Using Libre 3 for CGM and reader reflects average glucose of 121 per patient -Does not endorse s/sx of hypoglycemia  -Most recent A1c 6.0% on 5/23, down from 6.8% -Patient receives Ozempic  2mg  through [facility]-current enrollment expires 02/28/24   Assessment/Plan   Diabetes Management Plan -Continue Ozempic  2mg  weekly, hold Tresiba  for the next 4 weeks- goal to be able to keep patient off of insulin   -Continue to use Libre 3 for CGM   Follow-up: 4 weeks   [name], PharmD, DPLA

## 2023-12-29 ENCOUNTER — Other Ambulatory Visit: Payer: Self-pay | Admitting: Physician Assistant

## 2023-12-29 DIAGNOSIS — K219 Gastro-esophageal reflux disease without esophagitis: Secondary | ICD-10-CM

## 2023-12-29 MED ORDER — PANTOPRAZOLE SODIUM 40 MG PO TBEC: 40.0000 mg | DELAYED_RELEASE_TABLET | Freq: Two times a day (BID) | ORAL | 3 refills | Status: AC

## 2024-01-03 ENCOUNTER — Other Ambulatory Visit: Payer: Self-pay

## 2024-01-03 ENCOUNTER — Telehealth: Payer: Self-pay

## 2024-01-03 NOTE — Progress Notes (Signed)
   01/03/2024  Patient ID: Jeremiah Thomas, male   DOB: 03/21/1956, 68 y.o.   MRN: [id number]  Subjective/Objective Telephone follow-up visit to check on management of diabetes   Diabetes Management Plan -Current medications: Ozempic  2 mg weekly -Patient stopped using Tresiba  5 units daily 4 weeks ago -Using [place] 3 for CGM and reader reflects average glucose of 115 over the past 2 weeks -Does not endorse s/sx of hypoglycemia but does endorse some readings in the 60's -Most recent A1c 6.0% on 5/23, down from 6.8% -Patient receives Ozempic  2mg  through [facility] and is running low on medication-current enrollment expires 02/28/24   Assessment/Plan   Diabetes Management Plan -Continue Ozempic  2mg  weekly -Coordinating with medication assistance team for refill on Ozempic  2mg  weekly -Continue to use Libre 3 for CGM -Patient sees PCP again 8/22 and will be due for A1c -Bring Libre 3 reader to upcoming PCP visit to review home readings   Follow-up: 3 months   [name], PharmD, DPLA

## 2024-01-03 NOTE — Telephone Encounter (Signed)
 Sent re-order form per request for Ozempic  2 mg, for signature to provider's office.  Will fax to [facility] when returned.

## 2024-01-06 NOTE — Telephone Encounter (Signed)
 Faxed sign and completed form to [facility] for refill of Ozempic  2 mg, faxed with confirmation 01/06/24

## 2024-01-23 ENCOUNTER — Other Ambulatory Visit: Payer: Self-pay | Admitting: Physician Assistant

## 2024-01-23 DIAGNOSIS — J441 Chronic obstructive pulmonary disease with (acute) exacerbation: Secondary | ICD-10-CM

## 2024-01-31 ENCOUNTER — Telehealth: Payer: Self-pay

## 2024-01-31 NOTE — Telephone Encounter (Signed)
 Called patient - informed- that we have received patient assistance Ozempic  2mg  - 4 boxes ( 4 pens total)  Patient states will pick up at upcoming appt in office on Thursday 02/03/2024

## 2024-02-04 ENCOUNTER — Ambulatory Visit: Admitting: Physician Assistant

## 2024-02-04 ENCOUNTER — Emergency Department (HOSPITAL_BASED_OUTPATIENT_CLINIC_OR_DEPARTMENT_OTHER)
Admission: EM | Admit: 2024-02-04 | Discharge: 2024-02-04 | Disposition: A | Discharge status: Home / Self Care | Source: Ambulatory Visit | Attending: Emergency Medicine | Admitting: Emergency Medicine

## 2024-02-04 ENCOUNTER — Encounter: Payer: Self-pay | Admitting: Physician Assistant

## 2024-02-04 ENCOUNTER — Other Ambulatory Visit: Payer: Self-pay

## 2024-02-04 ENCOUNTER — Emergency Department (HOSPITAL_BASED_OUTPATIENT_CLINIC_OR_DEPARTMENT_OTHER)

## 2024-02-04 ENCOUNTER — Encounter (HOSPITAL_BASED_OUTPATIENT_CLINIC_OR_DEPARTMENT_OTHER): Payer: Self-pay

## 2024-02-04 VITALS — BP 142/89 | HR 114 | Ht 74.0 in | Wt 289.0 lb

## 2024-02-04 DIAGNOSIS — N1832 Chronic kidney disease, stage 3b: Secondary | ICD-10-CM

## 2024-02-04 DIAGNOSIS — I471 Supraventricular tachycardia, unspecified: Secondary | ICD-10-CM | POA: Insufficient documentation

## 2024-02-04 DIAGNOSIS — N183 Chronic kidney disease, stage 3 unspecified: Secondary | ICD-10-CM | POA: Diagnosis not present

## 2024-02-04 DIAGNOSIS — N184 Chronic kidney disease, stage 4 (severe): Secondary | ICD-10-CM | POA: Diagnosis not present

## 2024-02-04 DIAGNOSIS — Z8546 Personal history of malignant neoplasm of prostate: Secondary | ICD-10-CM | POA: Insufficient documentation

## 2024-02-04 DIAGNOSIS — E1169 Type 2 diabetes mellitus with other specified complication: Secondary | ICD-10-CM | POA: Diagnosis not present

## 2024-02-04 DIAGNOSIS — Z794 Long term (current) use of insulin: Secondary | ICD-10-CM | POA: Diagnosis not present

## 2024-02-04 DIAGNOSIS — Z87891 Personal history of nicotine dependence: Secondary | ICD-10-CM | POA: Insufficient documentation

## 2024-02-04 DIAGNOSIS — J449 Chronic obstructive pulmonary disease, unspecified: Secondary | ICD-10-CM

## 2024-02-04 DIAGNOSIS — E1122 Type 2 diabetes mellitus with diabetic chronic kidney disease: Secondary | ICD-10-CM

## 2024-02-04 DIAGNOSIS — R002 Palpitations: Secondary | ICD-10-CM | POA: Diagnosis present

## 2024-02-04 DIAGNOSIS — I5032 Chronic diastolic (congestive) heart failure: Secondary | ICD-10-CM

## 2024-02-04 DIAGNOSIS — Z8616 Personal history of COVID-19: Secondary | ICD-10-CM | POA: Insufficient documentation

## 2024-02-04 DIAGNOSIS — I129 Hypertensive chronic kidney disease with stage 1 through stage 4 chronic kidney disease, or unspecified chronic kidney disease: Secondary | ICD-10-CM | POA: Insufficient documentation

## 2024-02-04 DIAGNOSIS — I10 Essential (primary) hypertension: Secondary | ICD-10-CM

## 2024-02-04 DIAGNOSIS — E785 Hyperlipidemia, unspecified: Secondary | ICD-10-CM

## 2024-02-04 DIAGNOSIS — Z6837 Body mass index (BMI) 37.0-37.9, adult: Secondary | ICD-10-CM

## 2024-02-04 DIAGNOSIS — R000 Tachycardia, unspecified: Secondary | ICD-10-CM

## 2024-02-04 DIAGNOSIS — R079 Chest pain, unspecified: Secondary | ICD-10-CM | POA: Diagnosis not present

## 2024-02-04 DIAGNOSIS — J411 Mucopurulent chronic bronchitis: Secondary | ICD-10-CM

## 2024-02-04 DIAGNOSIS — J441 Chronic obstructive pulmonary disease with (acute) exacerbation: Secondary | ICD-10-CM

## 2024-02-04 LAB — BASIC METABOLIC PANEL WITH GFR
Anion gap: 12 (ref 5–15)
BUN: 24 mg/dL — ABNORMAL HIGH (ref 8–23)
CO2: 23 mmol/L (ref 22–32)
Calcium: 9.2 mg/dL (ref 8.9–10.3)
Chloride: 107 mmol/L (ref 98–111)
Creatinine, Ser: 2.8 mg/dL — ABNORMAL HIGH (ref 0.61–1.24)
GFR, Estimated: 24 mL/min — ABNORMAL LOW (ref >60)
Glucose, Bld: 102 mg/dL — ABNORMAL HIGH (ref 70–99)
Potassium: 4.2 mmol/L (ref 3.5–5.1)
Sodium: 141 mmol/L (ref 135–145)

## 2024-02-04 LAB — TROPONIN T, HIGH SENSITIVITY
Troponin T High Sensitivity: 49 ng/L — ABNORMAL HIGH (ref 0–19)
Troponin T High Sensitivity: 45 ng/L — ABNORMAL HIGH (ref 0–19)

## 2024-02-04 LAB — POCT GLYCOSYLATED HEMOGLOBIN (HGB A1C): Hemoglobin A1C: 5.9 % — AB (ref 4.0–5.6)

## 2024-02-04 LAB — CBC
HCT: 39.9 % (ref 39.0–52.0)
Hemoglobin: 11.7 g/dL — ABNORMAL LOW (ref 13.0–17.0)
MCH: 23.5 pg — ABNORMAL LOW (ref 26.0–34.0)
MCHC: 29.3 g/dL — ABNORMAL LOW (ref 30.0–36.0)
MCV: 80.1 fL (ref 80.0–100.0)
Platelets: 375 K/uL (ref 150–400)
RBC: 4.98 MIL/uL (ref 4.22–5.81)
RDW: 18 % — ABNORMAL HIGH (ref 11.5–15.5)
WBC: 9.5 K/uL (ref 4.0–10.5)
nRBC: 0 % (ref 0.0–0.2)

## 2024-02-04 LAB — MAGNESIUM: Magnesium: 2 mg/dL (ref 1.7–2.4)

## 2024-02-04 LAB — POCT UA - MICROALBUMIN
Creatinine, POC: 300 mg/dL
Microalbumin Ur, POC: 150 mg/L

## 2024-02-04 MED ORDER — SODIUM CHLORIDE 0.9 % IV BOLUS
500.0000 mL | Freq: Once | INTRAVENOUS | Status: AC
  Administered 2024-02-04: 500 mL via INTRAVENOUS

## 2024-02-04 MED ORDER — ADENOSINE 6 MG/2ML IV SOLN
6.0000 mg | Freq: Once | INTRAVENOUS | Status: AC
  Administered 2024-02-04: 6 mg via INTRAVENOUS
  Filled 2024-02-04: qty 2

## 2024-02-04 MED ORDER — ADENOSINE 6 MG/2ML IV SOLN
9.0000 mg | Freq: Once | INTRAVENOUS | Status: AC
  Administered 2024-02-04: 9 mg via INTRAVENOUS
  Filled 2024-02-04: qty 4

## 2024-02-04 MED ORDER — METOPROLOL TARTRATE 5 MG/5ML IV SOLN
5.0000 mg | Freq: Once | INTRAVENOUS | Status: AC
  Administered 2024-02-04: 5 mg via INTRAVENOUS
  Filled 2024-02-04: qty 5

## 2024-02-04 NOTE — Progress Notes (Addendum)
 Established Patient Office Visit  Subjective   Patient ID: Jeremiah Thomas, male    DOB: [birth year]  Age: 68 y.o. MRN: [id number]  Chief Complaint  Patient presents with   Medical Management of Chronic Issues    Last A1c 6.0    HPI Pt is a 68 yo obese male T2DM, HTN, HLD, hx of stroke, CHF who presents to the clinic for 3 month follow up. He is accompanied by a friend.   Pt is using his libre 3 to monitor his sugars. He has stayed in the green range for the last 7 days with one hypoglycemic event of 55 but did not have any hypoglycemic symptoms. He denies any CP or tightness. He does continue to feel dizzy from time to time but has hx of orthostatic hypotension. He has fallen once in the last 3 months. He is not taking any tresbia at the order of pharmacy. He is only taking ozempic  2mg  weekly now.   He has taken his metoprolol , losartan , and hydralazine  today.   His breathing has been stable. No recent COPd exacerbations.   ROS See HPI.    Objective:     BP (!) 142/89   Pulse (!) 114   Ht 6' 2 (1.88 m)   Wt 289 lb (131.1 kg)   SpO2 99%   BMI 37.11 kg/m  BP Readings from Last 3 Encounters:  02/04/24 (!) 182/122  02/04/24 (!) 142/89  11/05/23 128/72   Wt Readings from Last 3 Encounters:  02/04/24 289 lb (131.1 kg)  02/04/24 289 lb (131.1 kg)  11/05/23 296 lb (134.3 kg)      Physical Exam Constitutional:      Appearance: Normal appearance. He is obese.  Cardiovascular:     Rate and Rhythm: Tachycardia present.  Pulmonary:     Effort: Pulmonary effort is normal.     Breath sounds: Normal breath sounds.  Musculoskeletal:     Right lower leg: Edema present.     Left lower leg: Edema present.  Neurological:     General: No focal deficit present.     Mental Status: He is alert and oriented to person, place, and time.  Psychiatric:        Mood and Affect: Mood normal.    .. Results for orders placed or performed in visit on 02/04/24  TSH + free T4   Collection  Time: 02/04/24  9:51 AM  Result Value Ref Range   TSH 1.410 0.450 - 4.500 uIU/mL   Free T4 1.52 0.82 - 1.77 ng/dL  RFE85+ZHQM   Collection Time: 02/04/24  9:51 AM  Result Value Ref Range   Glucose 108 (H) 70 - 99 mg/dL   BUN 24 8 - 27 mg/dL   Creatinine, Ser 7.12 (H) 0.76 - 1.27 mg/dL   eGFR 23 (L) >40 fO/fpw/8.26   BUN/Creatinine Ratio 8 (L) 10 - 24   Sodium 141 134 - 144 mmol/L   Potassium 4.4 3.5 - 5.2 mmol/L   Chloride 107 (H) 96 - 106 mmol/L   CO2 19 (L) 20 - 29 mmol/L   Calcium  9.0 8.6 - 10.2 mg/dL   Total Protein 6.2 6.0 - 8.5 g/dL   Albumin 3.7 (L) 3.9 - 4.9 g/dL   Globulin, Total 2.5 1.5 - 4.5 g/dL   Bilirubin Total 0.3 0.0 - 1.2 mg/dL   Alkaline Phosphatase 98 44 - 121 IU/L   AST 17 0 - 40 IU/L   ALT 12 0 - 44 IU/L  CBC  w/Diff/Platelet   Collection Time: 02/04/24  9:51 AM  Result Value Ref Range   WBC 8.5 3.4 - 10.8 x10E3/uL   RBC 4.92 4.14 - 5.80 x10E6/uL   Hemoglobin 11.6 (L) 13.0 - 17.7 g/dL   Hematocrit 59.7 62.4 - 51.0 %   MCV 82 79 - 97 fL   MCH 23.6 (L) 26.6 - 33.0 pg   MCHC 28.9 (L) 31.5 - 35.7 g/dL   RDW 83.6 (H) 88.3 - 84.5 %   Platelets 406 150 - 450 x10E3/uL   Neutrophils 65 Not Estab. %   Lymphs 22 Not Estab. %   Monocytes 10 Not Estab. %   Eos 1 Not Estab. %   Basos 2 Not Estab. %   Neutrophils Absolute 5.6 1.4 - 7.0 x10E3/uL   Lymphocytes Absolute 1.9 0.7 - 3.1 x10E3/uL   Monocytes Absolute 0.8 0.1 - 0.9 x10E3/uL   EOS (ABSOLUTE) 0.1 0.0 - 0.4 x10E3/uL   Basophils Absolute 0.1 0.0 - 0.2 x10E3/uL   Immature Granulocytes 0 Not Estab. %   Immature Grans (Abs) 0.0 0.0 - 0.1 x10E3/uL  POCT UA - Microalbumin   Collection Time: 02/04/24  3:23 PM  Result Value Ref Range   Microalbumin Ur, POC 150 mg/L   Creatinine, POC 300 mg/dL   Albumin/Creatinine Ratio, Urine, POC 30-300   POCT HgB A1C   Collection Time: 02/04/24  3:24 PM  Result Value Ref Range   Hemoglobin A1C 5.9 (A) 4.0 - 5.6 %   HbA1c POC (<> result, manual entry)     HbA1c, POC  (prediabetic range)     HbA1c, POC (controlled diabetic range)       The 10-year ASCVD risk score (Arnett DK, et al., 2019) is: 50.3%    Assessment & Plan:  .Jeremiah Thomas was seen today for medical management of chronic issues.  Diagnoses and all orders for this visit:  Supraventricular tachycardia (HCC) -     TSH + free T4 -     CMP14+EGFR -     CBC w/Diff/Platelet  Mucopurulent chronic bronchitis (HCC)  Controlled type 2 diabetes mellitus with stage 4 chronic kidney disease, with long-term current use of insulin  (HCC) -     POCT UA - Microalbumin -     POCT HgB A1C  Essential hypertension, benign  Hyperlipidemia associated with type 2 diabetes mellitus (HCC)  Class 2 severe obesity due to excess calories with serious comorbidity and body mass index (BMI) of 37.0 to 37.9 in adult Jeremiah Thomas LP)  Chronic diastolic congestive heart failure (HCC)  Tachycardia -     EKG 12-Lead   A1C is to goal and looks great Continue on ozempic  weekly Work on walking more and keeping a good low carb/sugar diet Stay off insulin  Keep monitoring with libre 3 Microalbumin abnormal(CKD 4) on ACE/SGLT-2 BP not to goal HR very elevated  EKG showed SVT-sent to ED. Friend agrees to drive him. EKG given to patient.  Discussed SVT and seriousness of his heart not converting to another rhythm.  Cmp/CBC ordered today On statin Declined all vaccines today  COPD-no concerns today/controlled today on Trelegy  Return in about 3 months (around 05/06/2024).    Giana Castner, PA-C

## 2024-02-04 NOTE — ED Notes (Signed)
 Pt's HR@100bpm  after adenosine  administration.

## 2024-02-04 NOTE — ED Triage Notes (Signed)
 Pt reports that he was at his PCP this morning and reports some shortness breath. States that he is also light headed. States that PCP sent him here for further evaluation. PCP did an EKG with abnormal findings. SVT noted on EKG brought in by Pt

## 2024-02-04 NOTE — Patient Instructions (Signed)
 Supraventricular Tachycardia, Adult Supraventricular tachycardia (SVT) is a kind of abnormal heartbeat. It makes your heart beat very fast. This may last for just a short time. Or it may last longer and need treatment to get the heartbeat to go back to normal. A normal resting heartbeat is 60-100 times a minute. SVT can make your heart beat more than 150 times a minute.  The times when you have a fast heartbeat--or episodes of SVT--can be scary. But they're usually not dangerous. In some cases, SVT can lead to other heart problems. What are the causes?  SVT happens when electrical signals are sent out from areas of the upper part of the heart that don't normally send heartbeat signals. What increases the risk? You are more likely to get SVT if you are: Middle aged or older. Male. Other things that may increase your risk include: Having stress or feeling worried or nervous. Using illegal drugs such as cocaine or methamphetamine. Using over-the-counter cough or cold medicines. Stimulant drugs, such as caffeine. Smoking or alcohol use. Having any of these conditions: A thyroid condition. Diabetes. Obstructive sleep apnea. What are the signs or symptoms? A pounding heartbeat. Feeling fast or irregular heartbeats called palpitations. Shortness of breath. Weakness and tiredness. Tightness or pain in your chest. Feeling light-headed or dizzy. Feeling worried or nervous. Sometimes there are no symptoms. How is this diagnosed? This condition may be diagnosed based on: Your symptoms and a physical exam. Your health care provider will listen to your heart and feel your pulse. Tests. These may include: An electrocardiogram (ECG). This test checks for problems with electrical activity in the heart. A Holter monitor or event monitor test. For this test, you'll wear a device that monitors your heart rate over time. An echocardiogram. This test uses sound waves to make a picture of your  heart. A stress echocardiogram. An echocardiogram is done when you are at rest and after exercise. Blood tests. An electrophysiology study (EPS). This tests the electrical activity in your heart. It helps find where the abnormal heart rhythm is coming from. How is this treated? Treatment may include: Vagal nerve stimulation. Doing things to stimulate a nerve called the vagus nerve can slow down the heartbeat. Work with your provider to find which technique works best for you. Ways to do this include: Holding your breath and pushing, as though you are pooping. Putting gentle pressure on the neck. Do not try this yourself. Only a provider should do this. If done the wrong way, it can lead to a stroke. Medicines that prevent attacks. Medicine to stop an attack. This is given through an IV at the hospital. Cardioversion. This uses a small electric shock to stop an attack. Catheter ablation. This is a procedure to get rid of cells in the area that's causing the fast heartbeats. If you don't have symptoms, you may not need treatment. Follow these instructions at home: Stress Avoid things that make you feel stressed. Find healthy ways to deal with stress, such as: Doing yoga or meditation. Going out in nature. Listening to relaxing music. Taking steps to be healthy, such as getting lots of sleep, exercising, and eating a balanced diet. Talking with a mental health provider. Lifestyle Try to get at least 7 hours of sleep each night. Do not smoke, vape, or use products with nicotine or tobacco in them. If you need help quitting, talk with your provider. Do not use illegal drugs. If you need help quitting, talk with your provider. Do  not drink alcohol if it gives you a fast heartbeat. If alcohol does not seem to give you a fast heartbeat, limit your alcohol use. If you drink alcohol: Limit how much you have to: 0-1 drink a day if you are male. 0-2 drinks a day if you are male. Know how much  alcohol is in your drink. In the [place] one drink is one 12 oz bottle of beer (355 mL), one 5 oz glass of wine (148 mL), or one 1 oz glass of hard liquor (44 mL). Be aware of how caffeine affects you. If caffeine gives you a fast heartbeat, do not eat, drink, or use anything with caffeine in it. If caffeine doesn't seem to give you a fast heartbeat, limit how much caffeine you have. General instructions Stay at a healthy weight. Exercise often. Ask your provider about good activities for you. Try one or a mixture of these: 150 minutes a week of gentle exercise, like walking or yoga. 75 minutes a week of exercise that is very active, like running or swimming. Do vagal nerve stimulation only as told by your provider. Take medicines only as told by your provider. Keep all follow-up visits. Your provider will want to make sure your treatments are working. Contact a health care provider if: You have a fast heartbeat more often. The feeling that your heart is beating fast lasts longer than before. Home treatments to slow down your heartbeat do not help. You have new symptoms. Get help right away if: You have chest pain. Your heart beats very fast for more than 20 minutes. You have trouble breathing. You faint. Your symptoms get worse. These symptoms may be an emergency. Call 911 right away. Do not wait to see if the symptoms will go away. Do not drive yourself to the hospital. This information is not intended to replace advice given to you by your health care provider. Make sure you discuss any questions you have with your health care provider. Document Revised: 03/04/2023 Document Reviewed: 07/21/2022 Elsevier Patient Education  2024 [employer].

## 2024-02-04 NOTE — ED Provider Notes (Signed)
 Emergency Department Provider Note   I have reviewed the triage vital signs and the nursing notes.   HISTORY  Chief Complaint Tachycardia   HPI Jeremiah Thomas is a 68 y.o. male with past history of PE, CKD, diabetes presents to the emergency department with heart palpitations and shortness of breath.  He is felt slightly lightheaded without syncope.  No chest pain, pleuritic or otherwise.  He saw his PCP today for a 62-month checkup and on arrival was found to have tachycardia.  EKG showed concern for SVT and he was sent to the emergency department.  Denies any history of similar.  No changes to medications.  He has been compliant with his metoprolol .  Patient is not anticoagulated.  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 Ejection fraction is visually estimated at 60-65% LV diastolic function is mildly abnormal, but there is no evidence of elevated LVEDP or diastolic heart failure. Mild concentric left ventricular hypertrophy. No significant valvular stenosis or regurgitation. 05/2016   Former smoker 12/19/2019   Frequent urination at night 05/13/2017   History of COVID-19 09/20/2019   History of  stroke 05/20/2016   Carotid doppler 05/2016 Mild atherosclerosis less than 39 percent.    Hypertension    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   Lightheaded 09/12/2018   Low TSH level 08/25/2019   Lower GI bleed 08/25/2019   Malnutrition of mild degree (HCC) 08/25/2019   Morbidly obese (HCC) 09/08/2017   Nasal congestion 08/15/2017   Newly recognized heart murmur 02/21/2018   No energy 06/13/2018   Non-restorative sleep 06/13/2018   Orthostatic hypotension 12/07/2018   OSA (obstructive sleep apnea) 08/22/2018   Sleep study 08/2018.   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    Serum albumin decreased 08/25/2019   Snoring 06/13/2018   Type II diabetes mellitus, uncontrolled 09/13/2017    Review of Systems  Constitutional: No fever/chills Cardiovascular: Denies chest pain. Positive palpitations.  Respiratory: Positive shortness of breath. Gastrointestinal: No abdominal pain.  No nausea, no vomiting.   Genitourinary: Negative for dysuria. Neurological: Negative for headaches.  ____________________________________________   PHYSICAL EXAM:  VITAL SIGNS: Vitals:   02/04/24 1130 02/04/24 1300  BP:  (!) 182/122  Pulse: 94 84  Resp: 10 15  SpO2: 100% 100%    Constitutional: Alert and oriented. Well appearing and in no acute distress. Eyes: Conjunctivae are normal.  Head: Atraumatic. Nose: No congestion/rhinnorhea. Mouth/Throat: Mucous membranes are moist.   Neck: No stridor.   Cardiovascular: Tachycardia. Good peripheral circulation. Grossly normal heart sounds.   Respiratory: Normal respiratory effort.  No retractions. Lungs CTAB. Gastrointestinal: Soft and  nontender. No distention.  Musculoskeletal: No lower extremity tenderness nor edema. No gross deformities of extremities. Neurologic:  Normal speech and  language.  Skin:  Skin is warm, dry and intact. No rash noted.  ____________________________________________   LABS (all labs ordered are listed, but only abnormal results are displayed)  Labs Reviewed  BASIC METABOLIC PANEL WITH GFR - Abnormal; Notable for the following components:      Result Value   Glucose, Bld 102 (*)    BUN 24 (*)    Creatinine, Ser 2.80 (*)    GFR, Estimated 24 (*)    All other components within normal limits  CBC - Abnormal; Notable for the following components:   Hemoglobin 11.7 (*)    MCH 23.5 (*)    MCHC 29.3 (*)    RDW 18.0 (*)    All other components within normal limits  TROPONIN T, HIGH SENSITIVITY - Abnormal; Notable for the following components:   Troponin T High Sensitivity 49 (*)    All other components within normal limits  TROPONIN T, HIGH SENSITIVITY - Abnormal; Notable for the following components:   Troponin T High Sensitivity 45 (*)    All other components within normal limits  MAGNESIUM   ____________________________________________  EKG    EKG Interpretation Date/Time:  Friday February 04 2024 11:14:49 EDT Ventricular Rate:  104 PR Interval:  229 QRS Duration:  94 QT Interval:  377 QTC Calculation: 496 R Axis:   -29  Text Interpretation: Sinus tachycardia Prolonged PR interval Borderline left axis deviation Low voltage, precordial leads Abnormal R-wave progression, early transition Borderline T abnormalities, diffuse leads Borderline prolonged QT interval Confirmed by Darra Chew [phone]) on 02/04/2024 11:17:14 AM        ____________________________________________  RADIOLOGY  DG Chest Port 1 View Result Date: 02/04/2024 CLINICAL DATA:  Chest pain EXAM: PORTABLE CHEST 1 VIEW COMPARISON:  None Available. FINDINGS: Cardiac pad upper LEFT chest wall. Normal mediastinum and cardiac silhouette. Normal pulmonary vasculature. No evidence of effusion, infiltrate, or pneumothorax. No acute bony abnormality. In IMPRESSION: No acute  cardiopulmonary process. Electronically Signed   By: Jackquline Boxer M.D.   On: 02/04/2024 12:35    ____________________________________________   PROCEDURES  Procedure(s) performed:   .Cardioversion  Date/Time: 02/04/2024 11:17 AM  Performed by: Darra Chew MATSU, MD Authorized by: Darra Chew MATSU, MD   Consent:    Consent obtained:  Verbal   Consent given by:  Patient   Risks discussed:  Death, induced arrhythmia and pain   Alternatives discussed:  Rate-control medication Pre-procedure details:    Cardioversion basis:  Emergent   Rhythm:  Supraventricular tachycardia   Electrode placement:  Anterior-posterior Patient sedated: No Attempt one:    Cardioversion mode attempt one: Adenosine  6 mg.   Shock outcome:  No change in rhythm (Patient with brief conversion to NSR but then back to SVT.) Attempt two:    Cardioversion mode attempt two: Adenosine  9 mg.   Shock outcome:  Conversion to normal sinus rhythm Post-procedure details:    Patient status:  Awake   Patient tolerance of procedure:  Tolerated well, no immediate complications .Critical Care  Performed by: Darra Chew MATSU, MD Authorized by: Darra Chew MATSU, MD   Critical care provider statement:    Critical care time (minutes):  35   Critical care time was exclusive of:  Separately billable procedures and treating other patients and teaching time   Critical care was necessary to treat or prevent imminent or life-threatening deterioration of the  following conditions:  Cardiac failure   Critical care was time spent personally by me on the following activities:  Development of treatment plan with patient or surrogate, discussions with consultants, evaluation of patient's response to treatment, examination of patient, ordering and review of laboratory studies, ordering and review of radiographic studies, ordering and performing treatments and interventions, pulse oximetry, re-evaluation of patient's condition and review of old  charts   I assumed direction of critical care for this patient from another provider in my specialty: no      ____________________________________________   INITIAL IMPRESSION / ASSESSMENT AND PLAN / ED COURSE  Pertinent labs & imaging results that were available during my care of the patient were reviewed by me and considered in my medical decision making (see chart for details).   This patient is Presenting for Evaluation of palpitations, which does require a range of treatment options, and is a complaint that involves a high risk of morbidity and mortality.  The Differential Diagnoses include SVT, Atrial flutter, AVNRT, A fib RVR, sinus tachycardia, PE, etc.  Critical Interventions-    Medications  sodium chloride  0.9 % bolus 500 mL (0 mLs Intravenous Stopped 02/04/24 1244)  adenosine  (ADENOCARD ) 6 MG/2ML injection 6 mg (6 mg Intravenous Given 02/04/24 1106)  adenosine  (ADENOCARD ) 6 MG/2ML injection 9 mg (9 mg Intravenous Given 02/04/24 1112)  metoprolol  tartrate (LOPRESSOR ) injection 5 mg (5 mg Intravenous Given 02/04/24 1117)    Reassessment after intervention:  patient remains in NSR.    I decided to review pertinent External Data, and in summary EKG from PCP is similar to our tracing here.   Clinical Laboratory Tests Ordered, included troponin stable at 49-45.  Likely demand related ischemia with 1 week of symptoms and SVT.  Creatinine at 2.8 similar to prior.  No severe anemia.  Magnesium and potassium normal.  Radiologic Tests Ordered, included CXR. I independently interpreted the images and agree with radiology interpretation.   Cardiac Monitor Tracing which shows SVT/Flutter at rate of 138.   Social Determinants of Health Risk patient is a non-smoker.   Medical Decision Making: Summary:  The patient presents emergency department with palpitations and shortness of breath with lightheadedness.  Heart rate in the 140 range and regular with narrow complex.  Unclear if this  is SVT versus atrial flutter.  Plan for adenosine .   Reevaluation with update and discussion with patient.  After 2 doses of adenosine , 6 mg followed by 9 mg, patient converted to normal sinus rhythm.  No immediate complications.  Plan for dose of IV metoprolol  to complement his home medications and waiting for screening electrolytes.  Considered admission but patient cardioverted now in normal sinus rhythm with stable vitals and reassuring labs.  Stable for discharge.  Patient's presentation is most consistent with acute presentation with potential threat to life or bodily function.   Disposition: discharge  ____________________________________________  FINAL CLINICAL IMPRESSION(S) / ED DIAGNOSES  Final diagnoses:  SVT (supraventricular tachycardia) (HCC)    Note:  This document was prepared using Dragon voice recognition software and may include unintentional dictation errors.  Fonda Law, MD, [facility]    Lasaundra Riche, Fonda MATSU, MD 02/04/24 [phone]

## 2024-02-04 NOTE — Discharge Instructions (Signed)
 You were seen in the emerged from today with elevated heart rate.  We were able to convert you out of this heart rhythm and your vital signs have normalized.  Your lab work is reassuring.  If you develop chest pain or shortness of breath he should return.  If your heart palpitations return he should also come back to the ED.  Please reach out to your cardiologist today to make them aware of your ED visit.  Please continue to take your home medicines, especially your metoprolol .

## 2024-02-04 NOTE — ED Notes (Signed)
 Attempted IV access to LAC, tol well but unsuccessful

## 2024-02-04 NOTE — ED Notes (Signed)
 Pt placed on Zoll monitor prior to getting adenosine  medication. EDP at bedside with respiratory.

## 2024-02-05 LAB — CBC WITH DIFFERENTIAL/PLATELET
Basophils Absolute: 0.1 x10E3/uL (ref 0.0–0.2)
Basos: 2 %
EOS (ABSOLUTE): 0.1 x10E3/uL (ref 0.0–0.4)
Eos: 1 %
Hematocrit: 40.2 % (ref 37.5–51.0)
Hemoglobin: 11.6 g/dL — ABNORMAL LOW (ref 13.0–17.7)
Immature Grans (Abs): 0 x10E3/uL (ref 0.0–0.1)
Immature Granulocytes: 0 %
Lymphocytes Absolute: 1.9 x10E3/uL (ref 0.7–3.1)
Lymphs: 22 %
MCH: 23.6 pg — ABNORMAL LOW (ref 26.6–33.0)
MCHC: 28.9 g/dL — ABNORMAL LOW (ref 31.5–35.7)
MCV: 82 fL (ref 79–97)
Monocytes Absolute: 0.8 x10E3/uL (ref 0.1–0.9)
Monocytes: 10 %
Neutrophils Absolute: 5.6 x10E3/uL (ref 1.4–7.0)
Neutrophils: 65 %
Platelets: 406 x10E3/uL (ref 150–450)
RBC: 4.92 x10E6/uL (ref 4.14–5.80)
RDW: 16.3 % — ABNORMAL HIGH (ref 11.6–15.4)
WBC: 8.5 x10E3/uL (ref 3.4–10.8)

## 2024-02-05 LAB — CMP14+EGFR
ALT: 12 IU/L (ref 0–44)
AST: 17 IU/L (ref 0–40)
Albumin: 3.7 g/dL — ABNORMAL LOW (ref 3.9–4.9)
Alkaline Phosphatase: 98 IU/L (ref 44–121)
BUN/Creatinine Ratio: 8 — ABNORMAL LOW (ref 10–24)
BUN: 24 mg/dL (ref 8–27)
Bilirubin Total: 0.3 mg/dL (ref 0.0–1.2)
CO2: 19 mmol/L — ABNORMAL LOW (ref 20–29)
Calcium: 9 mg/dL (ref 8.6–10.2)
Chloride: 107 mmol/L — ABNORMAL HIGH (ref 96–106)
Creatinine, Ser: 2.87 mg/dL — ABNORMAL HIGH (ref 0.76–1.27)
Globulin, Total: 2.5 g/dL (ref 1.5–4.5)
Glucose: 108 mg/dL — ABNORMAL HIGH (ref 70–99)
Potassium: 4.4 mmol/L (ref 3.5–5.2)
Sodium: 141 mmol/L (ref 134–144)
Total Protein: 6.2 g/dL (ref 6.0–8.5)
eGFR: 23 mL/min/1.73 — ABNORMAL LOW (ref >59)

## 2024-02-05 LAB — TSH+FREE T4
Free T4: 1.52 ng/dL (ref 0.82–1.77)
TSH: 1.41 u[IU]/mL (ref 0.450–4.500)

## 2024-02-07 ENCOUNTER — Ambulatory Visit: Payer: Self-pay | Admitting: Physician Assistant

## 2024-02-07 NOTE — Progress Notes (Signed)
 Labs stable

## 2024-02-08 ENCOUNTER — Telehealth: Payer: Self-pay

## 2024-02-08 NOTE — Telephone Encounter (Signed)
 PAP: Patient assistance application for Ozempic  through [employer] has been mailed to pt's home address on file. Provider portion of application will be faxed to provider's office. E-filed patient portion.  Current enrollment ends 02/28/2024

## 2024-02-10 NOTE — Telephone Encounter (Signed)
 PAP: Application for Ozempic  has been submitted to [employer], via fax. CURRENT ENROLLMENT ENDS 02/28/24

## 2024-02-15 ENCOUNTER — Encounter: Payer: Self-pay | Admitting: Physician Assistant

## 2024-02-15 DIAGNOSIS — R000 Tachycardia, unspecified: Secondary | ICD-10-CM | POA: Insufficient documentation

## 2024-02-16 NOTE — Telephone Encounter (Signed)
 PAP: Patient assistance application for Ozempic  has been approved by PAP Companies: [employer] from 02-29-2024 to 02-23-2025. Medication should be delivered to PAP Delivery: Provider's office. For further shipping updates, please contact [employer] at [phone]. Patient ID is: [id number]

## 2024-02-18 ENCOUNTER — Other Ambulatory Visit (HOSPITAL_BASED_OUTPATIENT_CLINIC_OR_DEPARTMENT_OTHER): Payer: Self-pay

## 2024-02-23 ENCOUNTER — Other Ambulatory Visit (HOSPITAL_BASED_OUTPATIENT_CLINIC_OR_DEPARTMENT_OTHER): Payer: Self-pay

## 2024-03-11 ENCOUNTER — Other Ambulatory Visit: Payer: Self-pay | Admitting: Cardiology

## 2024-03-11 DIAGNOSIS — I10 Essential (primary) hypertension: Secondary | ICD-10-CM

## 2024-03-28 DIAGNOSIS — N184 Chronic kidney disease, stage 4 (severe): Secondary | ICD-10-CM | POA: Diagnosis not present

## 2024-03-28 DIAGNOSIS — I517 Cardiomegaly: Secondary | ICD-10-CM | POA: Diagnosis not present

## 2024-03-28 DIAGNOSIS — N3090 Cystitis, unspecified without hematuria: Secondary | ICD-10-CM | POA: Diagnosis not present

## 2024-03-28 DIAGNOSIS — R000 Tachycardia, unspecified: Secondary | ICD-10-CM | POA: Diagnosis not present

## 2024-03-28 DIAGNOSIS — I471 Supraventricular tachycardia, unspecified: Secondary | ICD-10-CM | POA: Diagnosis not present

## 2024-03-28 DIAGNOSIS — N3000 Acute cystitis without hematuria: Secondary | ICD-10-CM | POA: Diagnosis not present

## 2024-03-28 DIAGNOSIS — E1122 Type 2 diabetes mellitus with diabetic chronic kidney disease: Secondary | ICD-10-CM | POA: Diagnosis not present

## 2024-03-28 DIAGNOSIS — Z7901 Long term (current) use of anticoagulants: Secondary | ICD-10-CM | POA: Diagnosis not present

## 2024-03-28 DIAGNOSIS — Z8546 Personal history of malignant neoplasm of prostate: Secondary | ICD-10-CM | POA: Diagnosis not present

## 2024-03-28 DIAGNOSIS — R0789 Other chest pain: Secondary | ICD-10-CM | POA: Diagnosis not present

## 2024-03-28 DIAGNOSIS — N183 Chronic kidney disease, stage 3 unspecified: Secondary | ICD-10-CM | POA: Diagnosis not present

## 2024-03-28 DIAGNOSIS — R002 Palpitations: Secondary | ICD-10-CM | POA: Diagnosis not present

## 2024-03-28 DIAGNOSIS — J449 Chronic obstructive pulmonary disease, unspecified: Secondary | ICD-10-CM | POA: Diagnosis not present

## 2024-03-28 DIAGNOSIS — G4733 Obstructive sleep apnea (adult) (pediatric): Secondary | ICD-10-CM | POA: Diagnosis not present

## 2024-03-28 DIAGNOSIS — R55 Syncope and collapse: Secondary | ICD-10-CM | POA: Diagnosis not present

## 2024-03-28 DIAGNOSIS — Z86711 Personal history of pulmonary embolism: Secondary | ICD-10-CM | POA: Diagnosis not present

## 2024-03-28 DIAGNOSIS — J810 Acute pulmonary edema: Secondary | ICD-10-CM | POA: Diagnosis not present

## 2024-03-28 DIAGNOSIS — Z8673 Personal history of transient ischemic attack (TIA), and cerebral infarction without residual deficits: Secondary | ICD-10-CM | POA: Diagnosis not present

## 2024-03-28 DIAGNOSIS — N400 Enlarged prostate without lower urinary tract symptoms: Secondary | ICD-10-CM | POA: Diagnosis not present

## 2024-03-28 DIAGNOSIS — I509 Heart failure, unspecified: Secondary | ICD-10-CM | POA: Diagnosis not present

## 2024-03-28 DIAGNOSIS — Z7982 Long term (current) use of aspirin: Secondary | ICD-10-CM | POA: Diagnosis not present

## 2024-03-28 DIAGNOSIS — Z66 Do not resuscitate: Secondary | ICD-10-CM | POA: Diagnosis not present

## 2024-03-28 DIAGNOSIS — Z7985 Long-term (current) use of injectable non-insulin antidiabetic drugs: Secondary | ICD-10-CM | POA: Diagnosis not present

## 2024-03-28 DIAGNOSIS — I130 Hypertensive heart and chronic kidney disease with heart failure and stage 1 through stage 4 chronic kidney disease, or unspecified chronic kidney disease: Secondary | ICD-10-CM | POA: Diagnosis not present

## 2024-03-28 DIAGNOSIS — E785 Hyperlipidemia, unspecified: Secondary | ICD-10-CM | POA: Diagnosis not present

## 2024-03-28 DIAGNOSIS — Z79899 Other long term (current) drug therapy: Secondary | ICD-10-CM | POA: Diagnosis not present

## 2024-03-28 DIAGNOSIS — I129 Hypertensive chronic kidney disease with stage 1 through stage 4 chronic kidney disease, or unspecified chronic kidney disease: Secondary | ICD-10-CM | POA: Diagnosis not present

## 2024-04-07 DIAGNOSIS — I4719 Other supraventricular tachycardia: Secondary | ICD-10-CM | POA: Diagnosis not present

## 2024-04-12 NOTE — Progress Notes (Signed)
 Jeremiah Thomas                                          MRN: [id number]   04/12/2024   The [facility] Quality Team Specialist reviewed this patient medical record for the purposes of chart review for care gap closure. The following were reviewed: chart review for care gap closure-controlling blood pressure.    [facility] Quality Team

## 2024-04-24 ENCOUNTER — Other Ambulatory Visit: Payer: Self-pay | Admitting: Physician Assistant

## 2024-04-26 DIAGNOSIS — R000 Tachycardia, unspecified: Secondary | ICD-10-CM | POA: Diagnosis not present

## 2024-05-01 ENCOUNTER — Encounter: Payer: Self-pay | Admitting: Physician Assistant

## 2024-05-01 ENCOUNTER — Ambulatory Visit (INDEPENDENT_AMBULATORY_CARE_PROVIDER_SITE_OTHER): Admitting: Physician Assistant

## 2024-05-01 VITALS — BP 160/90 | HR 87 | Temp 98.4°F | Wt 288.0 lb

## 2024-05-01 DIAGNOSIS — I10 Essential (primary) hypertension: Secondary | ICD-10-CM | POA: Diagnosis not present

## 2024-05-01 DIAGNOSIS — R2689 Other abnormalities of gait and mobility: Secondary | ICD-10-CM | POA: Diagnosis not present

## 2024-05-01 DIAGNOSIS — N184 Chronic kidney disease, stage 4 (severe): Secondary | ICD-10-CM | POA: Diagnosis not present

## 2024-05-01 DIAGNOSIS — J449 Chronic obstructive pulmonary disease, unspecified: Secondary | ICD-10-CM

## 2024-05-01 DIAGNOSIS — Z794 Long term (current) use of insulin: Secondary | ICD-10-CM | POA: Diagnosis not present

## 2024-05-01 DIAGNOSIS — I951 Orthostatic hypotension: Secondary | ICD-10-CM | POA: Diagnosis not present

## 2024-05-01 DIAGNOSIS — E1122 Type 2 diabetes mellitus with diabetic chronic kidney disease: Secondary | ICD-10-CM

## 2024-05-01 DIAGNOSIS — R42 Dizziness and giddiness: Secondary | ICD-10-CM | POA: Diagnosis not present

## 2024-05-01 DIAGNOSIS — E785 Hyperlipidemia, unspecified: Secondary | ICD-10-CM | POA: Diagnosis not present

## 2024-05-01 DIAGNOSIS — E1169 Type 2 diabetes mellitus with other specified complication: Secondary | ICD-10-CM

## 2024-05-01 LAB — POCT GLYCOSYLATED HEMOGLOBIN (HGB A1C): Hemoglobin A1C: 5.8 % — AB (ref 4.0–5.6)

## 2024-05-01 MED ORDER — OZEMPIC (2 MG/DOSE) 8 MG/3ML ~~LOC~~ SOPN: PEN_INJECTOR | SUBCUTANEOUS | 3 refills | Status: AC

## 2024-05-01 MED ORDER — TRELEGY ELLIPTA 100-62.5-25 MCG/ACT IN AEPB: 1.0000 | INHALATION_SPRAY | Freq: Every day | RESPIRATORY_TRACT | 1 refills | Status: AC

## 2024-05-01 NOTE — Patient Instructions (Signed)
 Restart the finesteride. Keep BP log and follow up with nurse visit in 2 weeks.

## 2024-05-01 NOTE — Progress Notes (Signed)
 Established Patient Office Visit  Subjective   Patient ID: Jeremiah Thomas, male    DOB: Dec 24, 1955  Age: 68 y.o. MRN: [id number]  Chief Complaint  Patient presents with   Medical Management of Chronic Issues    HPI .SABRADiscussed the use of AI scribe software for clinical note transcription with the patient, who gave verbal consent to proceed.  History of Present Illness Jeremiah Thomas is a 68 year old male with diabetes who presents for diabetes management and a three-month follow-up.  Glycemic control - Diabetes managed with sensor-monitored glucose levels ranging from 107 to 200 mg/dL - Occasional hypoglycemic episodes with readings as low as 57 mg/dL, treats with eating and drinking.   Syncope and orthostatic hypotension - Emergency department visit on October 14th for syncope and collapse - EKG revealed tachyarrhythmia - CT head negative for acute abnormalities - Troponins negative - Echocardiogram showed ejection fraction of 55/60 - Orthostatic hypotension identified - Dizziness and shortness of breath, especially with position changes - Falls, including one at [employer] - Episodes of lightheadedness and dizziness, sometimes with vertigo (room spinning) - Occasional difficulty breathing and sweating during dizzy spells - Not currently monitoring blood pressure at home  Urinary tract infection - Urinary tract infection diagnosed during recent emergency department visit - Treated with Rocephin and Augmentin - No current urinary symptoms  Pulmonary embolism and anticoagulation - History of pulmonary embolism - No new clots identified on recent scans - Previously on anticoagulation therapy, discontinued due to falls    ROS See HPI.    Objective:     BP (!) 160/90   Pulse 87   Temp 98.4 F (36.9 C) (Oral)   Wt 288 lb (130.6 kg)   SpO2 98%   BMI 36.98 kg/m  BP Readings from Last 3 Encounters:  05/01/24 (!) 160/90  02/04/24 (!) 182/122  02/04/24 (!) 142/89   Wt  Readings from Last 3 Encounters:  05/01/24 288 lb (130.6 kg)  02/04/24 289 lb (131.1 kg)  02/04/24 289 lb (131.1 kg)      Physical Exam Constitutional:      Appearance: Normal appearance. He is obese.  HENT:     Head: Normocephalic.  Cardiovascular:     Rate and Rhythm: Normal rate and regular rhythm.     Pulses: Normal pulses.     Heart sounds: Murmur heard.  Pulmonary:     Effort: Pulmonary effort is normal.     Breath sounds: Normal breath sounds.  Musculoskeletal:     Right lower leg: Edema present.     Left lower leg: Edema present.  Neurological:     General: No focal deficit present.     Mental Status: He is alert and oriented to person, place, and time.  Psychiatric:        Mood and Affect: Mood normal.      Results for orders placed or performed in visit on 05/01/24  POCT HgB A1C  Result Value Ref Range   Hemoglobin A1C 5.8 (A) 4.0 - 5.6 %   HbA1c POC (<> result, manual entry)     HbA1c, POC (prediabetic range)     HbA1c, POC (controlled diabetic range)       The 10-year ASCVD risk score (Arnett DK, et al., 2019) is: 45.7%    Assessment & Plan:  .SABRAJohn was seen today for medical management of chronic issues.  Diagnoses and all orders for this visit:  Controlled type 2 diabetes mellitus with stage 4 chronic kidney disease, with  long-term current use of insulin  (HCC) -     POCT HgB A1C -     Ambulatory referral to Ophthalmology -     Semaglutide , 2 MG/DOSE, (OZEMPIC , 2 MG/DOSE,) 8 MG/3ML SOPN; 2mg  weekly [place] injection.  Essential hypertension, benign  Hyperlipidemia associated with type 2 diabetes mellitus (HCC)  Chronic obstructive pulmonary disease, unspecified COPD type (HCC) -     Fluticasone -Umeclidin-Vilant (TRELEGY ELLIPTA ) 100-62.5-25 MCG/ACT AEPB; Inhale 1 puff into the lungs daily.  Ocular vertigo -     Ambulatory referral to Home Health  Balance problems -     Ambulatory referral to Home Health  Orthostatic hypotension -      Ambulatory referral to Home Health    Assessment & Plan Type 2 diabetes mellitus with chronic kidney disease and other specified complication Diabetes well-controlled with HbA1c of 5.8%. Occasional hypoglycemia noted. - Continue current diabetes management plan. - Monitor blood glucose levels regularly, especially in the morning. - Ensure small, healthy meals to prevent hypoglycemia.  Essential hypertension with orthostatic hypotension Blood pressure elevated with orthostatic drops. Recent syncope and collapse. Finestra stopped in hospital, can be restarted. - Restart Finesteride to aid in blood pressure management. - Monitor blood pressure at home, especially in the morning and early afternoon, and maintain a log. - Avoid sudden position changes to prevent dizziness and falls. - Follow up with cardiology on January 28th.  Recurrent syncope and collapse Recent syncope likely due to orthostatic hypotension, possibly exacerbated by UTI. Echocardiogram normal. - Avoid sudden movements and ensure support when standing. - Monitor for any recurrence of syncope or collapse.  Ocular vertigo Dizziness and lightheadedness suggestive of ocular vertigo. Vestibular exercises may be needed. - Ordered vestibular exercises through physical therapy to be conducted at home.  Urinary tract infection, resolved Recent UTI treated with Rocephin and Augmentin. - Monitor for any recurrence of urinary symptoms and report if he occurs.  History of pulmonary embolism No new pulmonary embolism on recent imaging. Anticoagulation stopped due to fall risk.  General Health Maintenance Declined flu vaccination.    Return in about 3 months (around 08/01/2024) for 2 weeks nurse visit for BP. .    Olden Klauer, PA-C

## 2024-05-02 ENCOUNTER — Telehealth: Payer: Self-pay

## 2024-05-02 NOTE — Telephone Encounter (Signed)
 Received Ozempic  2mg  - 4 boxes ( one pen per box ) form patient assistance.  Patient informed and will pick up at his convenience.

## 2024-05-04 ENCOUNTER — Other Ambulatory Visit (HOSPITAL_COMMUNITY): Payer: Self-pay

## 2024-05-06 DIAGNOSIS — N184 Chronic kidney disease, stage 4 (severe): Secondary | ICD-10-CM | POA: Diagnosis not present

## 2024-05-06 DIAGNOSIS — G4733 Obstructive sleep apnea (adult) (pediatric): Secondary | ICD-10-CM | POA: Diagnosis not present

## 2024-05-06 DIAGNOSIS — I951 Orthostatic hypotension: Secondary | ICD-10-CM | POA: Diagnosis not present

## 2024-05-06 DIAGNOSIS — I509 Heart failure, unspecified: Secondary | ICD-10-CM | POA: Diagnosis not present

## 2024-05-06 DIAGNOSIS — E1169 Type 2 diabetes mellitus with other specified complication: Secondary | ICD-10-CM | POA: Diagnosis not present

## 2024-05-06 DIAGNOSIS — I110 Hypertensive heart disease with heart failure: Secondary | ICD-10-CM | POA: Diagnosis not present

## 2024-05-06 DIAGNOSIS — J449 Chronic obstructive pulmonary disease, unspecified: Secondary | ICD-10-CM | POA: Diagnosis not present

## 2024-05-06 DIAGNOSIS — E1122 Type 2 diabetes mellitus with diabetic chronic kidney disease: Secondary | ICD-10-CM | POA: Diagnosis not present

## 2024-05-06 DIAGNOSIS — Z7985 Long-term (current) use of injectable non-insulin antidiabetic drugs: Secondary | ICD-10-CM | POA: Diagnosis not present

## 2024-05-06 DIAGNOSIS — R32 Unspecified urinary incontinence: Secondary | ICD-10-CM | POA: Diagnosis not present

## 2024-05-06 DIAGNOSIS — Z87440 Personal history of urinary (tract) infections: Secondary | ICD-10-CM | POA: Diagnosis not present

## 2024-05-06 DIAGNOSIS — Z7951 Long term (current) use of inhaled steroids: Secondary | ICD-10-CM | POA: Diagnosis not present

## 2024-05-06 DIAGNOSIS — Z9181 History of falling: Secondary | ICD-10-CM | POA: Diagnosis not present

## 2024-05-06 DIAGNOSIS — Z7982 Long term (current) use of aspirin: Secondary | ICD-10-CM | POA: Diagnosis not present

## 2024-05-06 DIAGNOSIS — Z86711 Personal history of pulmonary embolism: Secondary | ICD-10-CM | POA: Diagnosis not present

## 2024-05-06 DIAGNOSIS — E785 Hyperlipidemia, unspecified: Secondary | ICD-10-CM | POA: Diagnosis not present

## 2024-05-06 DIAGNOSIS — Z8616 Personal history of COVID-19: Secondary | ICD-10-CM | POA: Diagnosis not present

## 2024-05-06 DIAGNOSIS — Z993 Dependence on wheelchair: Secondary | ICD-10-CM | POA: Diagnosis not present

## 2024-05-08 ENCOUNTER — Ambulatory Visit: Admitting: Physician Assistant

## 2024-05-10 ENCOUNTER — Other Ambulatory Visit: Payer: Self-pay

## 2024-05-10 DIAGNOSIS — I4719 Other supraventricular tachycardia: Secondary | ICD-10-CM | POA: Diagnosis not present

## 2024-05-10 NOTE — Progress Notes (Unsigned)
   05/10/2024  Patient ID: Jeremiah Thomas, male   DOB: [birth year], 68 y.o.   MRN: [id number]  Successful outreach attempt for telephone visit, but patient was not at home and request to reschedule visit.  Visit has been moved to Monday 12/1 at 230pm.  [name], PharmD, DPLA

## 2024-05-11 NOTE — Progress Notes (Signed)
   05/15/2024  Patient ID: Jeremiah Thomas, male   DOB: Oct 26, 1955, 68 y.o.   MRN: [id number]  Subjective/Objective Telephone follow-up visit to check on management of diabetes   Diabetes Management Plan -Current medications: Ozempic  2 mg weekly -Using Libre 3 for CGM and endorses FBG 70-130 -Does not endorse s/sx of hypoglycemia or hyperglycemia -Most recent A1c 6.0% on 5/23, down from 6.8% -Patient receives Ozempic  2mg  through [facility] and just picked up 4 boxes from [facility] today -Statin for ASCVD risk reduction: atorvastatin  80mg  daily; LDL 98 on 10/14 -ACEi/ARB for cardiorenal protection: losartan  50mg  daily (dose increased today based on elevated BP).  Patient is also taking hydralazine  25mg  TID, metoprolol  100mg  BID and has resume finasteride  5mg  daily (which can help with BP.  Not currently checking home BP, because his monitor quit working; but he plans to get a new home BP monitor today. -UACR 30-300 8/22  A1c Lab Results  Component Value Date   HGBA1C 5.8 (A) 05/01/2024   HGBA1C 5.9 (A) 02/04/2024   HGBA1C 6.0 (A) 11/05/2023   Assessment/Plan   Diabetes Management Plan -A1c at goal of <7% -UACR is not at goal -LDL is not at goal of <70- could consider addition of ezetimibe 10mg  daily for further LDL reduction -BP not at goal of <130/80, but hopefully this will improve with increased losartan  dosing of 50mg  daily.  Patient plans to purchase new home BP monitor today; advised him to monitor and record readings and bring those to his BP follow-up 12/16.  I recommend a follow-up CMP based on last eGFR, Scr, and recent losartan  dose increase. -Continue Ozempic  2mg  weekly; patient plans to keep [facility] diabetes and heart care plan, which should cover Ozempic  at no cost once he runs out of currently supply from [facility] (around March).  Prescription will need to be sent to pharmacy and PA done. -Could consider addition of SGLT2 based on UACR (as long as no PMH of recurrent genitourinary  infections); this could also help with BP control, and patient would likely qualify for AZ&Me PAP for Jeremiah Thomas  Follow-up: 2/2   Jeremiah Thomas Jeremiah Thomas, PharmD, DPLA

## 2024-05-15 ENCOUNTER — Ambulatory Visit

## 2024-05-15 ENCOUNTER — Other Ambulatory Visit: Payer: Self-pay

## 2024-05-15 VITALS — BP 159/81 | HR 83

## 2024-05-15 DIAGNOSIS — I10 Essential (primary) hypertension: Secondary | ICD-10-CM | POA: Diagnosis not present

## 2024-05-15 DIAGNOSIS — E1169 Type 2 diabetes mellitus with other specified complication: Secondary | ICD-10-CM

## 2024-05-15 DIAGNOSIS — N183 Chronic kidney disease, stage 3 unspecified: Secondary | ICD-10-CM

## 2024-05-15 NOTE — Progress Notes (Signed)
 Call patient and see if ok with staring zetia for better cholesterol control?

## 2024-05-15 NOTE — Progress Notes (Signed)
   Established Patient Office Visit  Subjective   Patient ID: Jeremiah Thomas, male    DOB: Nov 03, 1955  Age: 68 y.o. MRN: [id number]  Chief Complaint  Patient presents with   Hypertension    HPI  Jeremiah Thomas is here for blood pressure check. Denies chest pain or shortness of breath. He does still feel lightheaded.   Home monitor readings 165/115 172/108 197/117 185/112 169/109 164/102 171/110 160/104 176/107 167/103  ROS    Objective:     BP (!) 159/81   Pulse 83   SpO2 100%    Physical Exam   No results found for any visits on 05/15/24.    The 10-year ASCVD risk score (Arnett DK, et al., 2019) is: 45.3%    Assessment & Plan:  Blood pressure check -  Per Vermell, patient advised to increase the losartan  from 25 mg daily to 50 mg daily. Take all other medications as directed. Return in 2 weeks for a nurse visit blood pressure check.   Problem List Items Addressed This Visit       Unprioritized   Hypertension goal BP (blood pressure) < 140/90 - Primary    Return in about 2 weeks (around 05/29/2024) for nurse visit blood pressure check. SABRA Pear, Jon Mayor, CMA

## 2024-05-22 ENCOUNTER — Ambulatory Visit: Payer: Self-pay

## 2024-05-22 NOTE — Telephone Encounter (Signed)
 FYI Only or Action Required?: FYI only for provider: ED advised and refused.  Patient was last seen in primary care on 05/01/2024 by Antoniette Vermell CROME, PA-C.  Called Nurse Triage reporting Fall.  Symptoms began today.  Interventions attempted: Nothing.  Symptoms are: unchanged.  Triage Disposition: See HCP Within 4 Hours (Or PCP Triage)  Patient/caregiver understands and will follow disposition?: No, refuses disposition  Copied from CRM #[id number]. Topic: Clinical - Red Word Triage >> May 22, 2024  4:45 PM Mercer PEDLAR wrote: Red Word that prompted transfer to Nurse Triage: [place] - PT is currently with patient ans stated that he has had a fall and is having chest pain.   Reason for Disposition  [1] Systolic BP >= 200 OR Diastolic >= 120 AND [2] having NO cardiac or neurologic symptoms  Answer Assessment - Initial Assessment Questions Medford, from [facility] PT, called in stating that one hour before he arrived pt had a fall at home. Pt on speaker with Medford, stating he was walking to the bathroom and before he got through the doorway of bathroom, he LOC and fell backwards. Pt reports his wife was present and he did not hit his head, he fell straight back into the wall. Pt sitting when Hume arrived; at that time BP 180/100. He reports pt's last known blood sugar was 68 on Saturday, 12/06. Pt reports needing a new sensor; needs to pick up from the pharmacy. Based on symptoms and office closer, this RN recommended ED. Pt declines stating BP is always high.  While on the phone, Medford took additional BP readings at rest and standing. Pt reports dizziness is subsiding. Declines chest pain, n/v or SOB. Medford states that pt does have BP recorded since 12/01 and all values are abn high. Medford requested if PCP comfortable, she can place order to [facility] for skilled nursing to evaluate. He states concern that PT is only once a week and he may miss something where nursing would be more frequent. Discussed ED  disposition, pt refused. Pt reports monitoring BP and taking medication. Pt is not alone and advises wife can call if he needs help.   1. BLOOD PRESSURE: What is your blood pressure? Did you take at least two measurements 5 minutes apart?     180/100 1h post fall, while sitting by PT; 160/98 sitting by PT @5pm ; 148/118 while standing per PT  2. ONSET: When did you take your blood pressure?     One hour post fall; 20 minutes later   3. HOW: How did you take your blood pressure? (e.g., automatic home BP monitor, visiting nurse)     Physical therapist   4. HISTORY: Do you have a history of high blood pressure?     Yes   5. MEDICINES: Are you taking any medicines for blood pressure? Have you missed any doses recently?     Losartan  50mg  daily, Metoprolol  100mg  BID; hydralazine  25mg  TID Pt reports taking medication as directed; has not missed any doses   6. OTHER SYMPTOMS: Do you have any symptoms? (e.g., blurred vision, chest pain, difficulty breathing, headache, weakness)     Denies chest pain; states feeling a bit dizzy  Protocols used: Blood Pressure - High-A-AH

## 2024-05-23 ENCOUNTER — Telehealth: Payer: Self-pay

## 2024-05-23 NOTE — Telephone Encounter (Signed)
 Ok

## 2024-05-23 NOTE — Telephone Encounter (Unsigned)
 Copied from CRM #[id number]. Topic: Clinical - Home Health Verbal Orders >> May 23, 2024  9:54 AM Rosaria BRAVO wrote: Caller/Agency: [facility]  Callback Number: [id number] Service Requested: Skilled Nursing Frequency:   Nursing eval to start  Any new concerns about the patient? Yes   BP concerns

## 2024-05-24 NOTE — Telephone Encounter (Signed)
 Attempted call to [facility] home health. Left a voice mail message requesting a return call.

## 2024-05-24 NOTE — Telephone Encounter (Signed)
 VO given today per Jade Breeback, PA-C

## 2024-05-25 ENCOUNTER — Telehealth: Payer: Self-pay | Admitting: Cardiology

## 2024-05-25 NOTE — Telephone Encounter (Addendum)
 Spoke with home health nurse. She stated that the pt says he was in the hospital and also has had a monitor placed and has not heard any results. He has an appt to see Dr. Bernie 07-13-23. Appt moved up to 12-18 will try to get hospital records before appt.

## 2024-05-25 NOTE — Telephone Encounter (Signed)
.  SYNCOPECHMG   Pt c/o Syncope: STAT if syncope occurred within 24 hours and pt complains of lightheadedness.   High Priority if episode of passing out, completely, today or in last 24 hours   1. Did you pass out today?   No  2. When is the last time you passed out?    Monday was last time    3. Has this occurred multiple times?   Yes - multiple episodes in the past week  4. Did you have any symptoms prior to passing out?   Dizzy, sweaty, flushed, SOB  5. Did you fall? If so, are you on a blood thinner?   Yes and takes BP pills and an aspirin  a day   Caller Mylene) stated patient needs to be contacted regarding his heart monitor results and has been having multiple syncopal episodes since hospital visit.  Caller stated patient should be contacted directly.

## 2024-05-25 NOTE — Telephone Encounter (Signed)
 LVM for Althea at [facility] to call regarding message about pt

## 2024-05-26 ENCOUNTER — Telehealth: Payer: Self-pay

## 2024-05-26 NOTE — Telephone Encounter (Signed)
 Being addressed in separate message

## 2024-05-26 NOTE — Progress Notes (Signed)
° °  05/26/2024  Patient ID: Norleen Hint, male   DOB: October 03, 1955, 68 y.o.   MRN: [id number]  This patient is appearing on a report for being at risk of failing the adherence measure for hypertension (ACEi/ARB) medications this calendar year.   Medication: losartan  25mg  Last fill date: 04/26/24 for 45 day supply if patient continues to take 2 tablets daily as advised by PCP 12/1 based on hypertension  Insurance report was not up to date. No action needed at this time.   [name], PharmD, DPLA

## 2024-05-29 ENCOUNTER — Ambulatory Visit: Attending: Cardiology | Admitting: Cardiology

## 2024-05-29 ENCOUNTER — Other Ambulatory Visit: Payer: Self-pay | Admitting: Physician Assistant

## 2024-05-29 ENCOUNTER — Encounter: Payer: Self-pay | Admitting: Cardiology

## 2024-05-29 ENCOUNTER — Encounter: Payer: Self-pay | Admitting: *Deleted

## 2024-05-29 ENCOUNTER — Other Ambulatory Visit (HOSPITAL_BASED_OUTPATIENT_CLINIC_OR_DEPARTMENT_OTHER): Payer: Self-pay

## 2024-05-29 VITALS — BP 140/90 | HR 82 | Ht 74.0 in | Wt 287.0 lb

## 2024-05-29 DIAGNOSIS — I471 Supraventricular tachycardia, unspecified: Secondary | ICD-10-CM

## 2024-05-29 DIAGNOSIS — I440 Atrioventricular block, first degree: Secondary | ICD-10-CM

## 2024-05-29 DIAGNOSIS — G4733 Obstructive sleep apnea (adult) (pediatric): Secondary | ICD-10-CM | POA: Diagnosis not present

## 2024-05-29 DIAGNOSIS — I951 Orthostatic hypotension: Secondary | ICD-10-CM

## 2024-05-29 DIAGNOSIS — I5032 Chronic diastolic (congestive) heart failure: Secondary | ICD-10-CM

## 2024-05-29 NOTE — Addendum Note (Signed)
 Addended by: ARLOA PLANAS D on: 05/29/2024 10:17 AM   Modules accepted: Orders

## 2024-05-29 NOTE — Patient Instructions (Signed)
 Medication Instructions:  Your physician recommends that you continue on your current medications as directed. Please refer to the Current Medication list given to you today.  *If you need a refill on your cardiac medications before your next appointment, please call your pharmacy*   Lab Work: None Ordered If you have labs (blood work) drawn today and your tests are completely normal, you will receive your results only by: MyChart Message (if you have MyChart) OR A paper copy in the mail If you have any lab test that is abnormal or we need to change your treatment, we will call you to review the results.   Testing/Procedures: None Ordered   Follow-Up: At [facility], you and your health needs are our priority.  As part of our continuing mission to provide you with exceptional heart care, we have created designated Provider Care Teams.  These Care Teams include your primary Cardiologist (physician) and Advanced Practice Providers (APPs -  Physician Assistants and Nurse Practitioners) who all work together to provide you with the care you need, when you need it.  We recommend signing up for the patient portal called "MyChart".  Sign up information is provided on this After Visit Summary.  MyChart is used to connect with patients for Virtual Visits (Telemedicine).  Patients are able to view lab/test results, encounter notes, upcoming appointments, etc.  Non-urgent messages can be sent to your provider as well.   To learn more about what you can do with MyChart, go to [email or web address].    Your next appointment:   3 month(s)  The format for your next appointment:   In Person  Provider:   Gypsy Balsam, MD    Other Instructions Appt with Dr. Elberta Fortis

## 2024-05-29 NOTE — Progress Notes (Unsigned)
° °  Subjective:    Patient ID: Jeremiah Thomas, male    DOB: 1956/04/23, 68 y.o.   MRN: [id number]  HPI  Patient is here for a 2 wk blood pressure recheck. Patient currently takes losartan  25 mg, metoprolol  tartrate 100 mg Denies CP, SOB, heart palpitations, or vision changes.   Review of Systems     Objective:   Physical Exam        Assessment & Plan:   Patients first BP is 149/82 second is 142/87. Reported to [facility] who doesn't want to make any changes due to the patient has an history of falling when BP drops to low. I did try to encourage the patient to use his assisting devices. He says he only uses it when needed. RTC PRN

## 2024-05-29 NOTE — Progress Notes (Signed)
 Cardiology Office Note:    Date:  05/29/2024   ID:  Jeremiah Thomas, DOB [birth year], MRN [id number]  PCP:  Antoniette Vermell CROME, PA-C  Cardiologist:  Lamar Fitch, MD    Referring MD: Antoniette Vermell CROME, [place]   Chief Complaint  Patient presents with   Follow-up    History of Present Illness:    Jeremiah Thomas is a 68 y.o. male past medical history significant for orthostatic hypotension, chronic kidney disease with creatinine in the neighborhood of 2.5-2.7, obstructive sleep apnea however he does not use CPAP mask on the regular basis, history of prostate cancer, diabetes previously poorly controlled now seems to be better, I did see him last time in December 2024 he disappeared from follow-up recently and that going to [facility] hospital because of episode of syncope.  He was found to have orthostatic hypotension then.  Also have some arrhythmia eventually monitor has been placed on monitor shows short runs of ventricular tachycardia as well as episode of supra ventricular tachycardia some lasting up to 9 hours.  He comes here to talk about this.  While he wear a monitor that was done by [facility] he did take already 100 mg of metoprolol  twice daily.  He said he is doing quite well right now.  Denies have any chest pain tightness squeezing pressure burning chest try to walk a little bit around does have some swelling of lower extremities denies having any recent palpitations or dizziness  Past Medical History:  Diagnosis Date   Acute kidney injury 06/02/2016   Anemia 08/25/2019   Asymptomatic hypertensive urgency 05/20/2016   Benign prostatic hyperplasia without lower urinary tract symptoms 12/19/2019   Bilateral leg edema 02/21/2018   Bilateral pulmonary embolism (HCC) 08/25/2019   Chronic respiratory failure with hypoxia (HCC) 08/25/2019   CKD (chronic kidney disease) stage 3, GFR 30-59 ml/min (HCC) 06/03/2016   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, without long-term  current use of insulin  (HCC) 09/20/2019   Craving for particular food 02/21/2018   Depressed mood 09/20/2019   Elevated fasting glucose 07/20/2017   Elevated lipase 08/25/2019   Elevated PSA 07/20/2017   Essential hypertension, benign 06/02/2016   Normal left ventricular size and systolic function with no appreciable segmental abnormality. Ejection fraction is visually estimated at 60-65% LV diastolic function is mildly abnormal, but there is no evidence of elevated LVEDP or diastolic heart failure. Mild concentric left ventricular hypertrophy. No significant valvular stenosis or regurgitation. 05/2016   Former smoker 12/19/2019   Frequent urination at night 05/13/2017   History of COVID-19 09/20/2019   History of stroke 05/20/2016   Carotid doppler 05/2016 Mild atherosclerosis less than 39 percent.    Hypertension    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   Lightheaded 09/12/2018   Low TSH level 08/25/2019   Lower GI bleed 08/25/2019   Malnutrition of mild degree 08/25/2019   Morbidly obese (HCC) 09/08/2017   Nasal congestion 08/15/2017   Newly recognized heart murmur 02/21/2018   No energy 06/13/2018   Non-restorative sleep 06/13/2018   Orthostatic hypotension 12/07/2018   OSA (obstructive sleep apnea) 08/22/2018   Sleep study 08/2018.   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    Serum albumin decreased 08/25/2019   Snoring 06/13/2018   Type II diabetes mellitus, uncontrolled 09/13/2017    Past Surgical History:  Procedure  Laterality Date   VEIN REMOVED Right    Rt leg    Current Medications: Active Medications[1]   Allergies:   Patient has no known allergies.   Social History   Socioeconomic History   Marital status: Married    Spouse name: Not on file   Number of children: Not on file   Years of education: Not on file    Highest education level: Not on file  Occupational History   Not on file  Tobacco Use   Smoking status: Former   Smokeless tobacco: Never  Substance and Sexual Activity   Alcohol use: No   Drug use: No   Sexual activity: Not Currently  Other Topics Concern   Not on file  Social History Narrative   Not on file   Social Drivers of Health   Tobacco Use: Medium Risk (05/29/2024)   Patient History    Smoking Tobacco Use: Former    Smokeless Tobacco Use: Never    Passive Exposure: Not on Actuary Strain: Not on file  Food Insecurity: Low Risk (03/29/2024)   Received from [facility]   Epic    Within the past 12 months, you worried that your food would run out before you got money to buy more: Never true    Within the past 12 months, the food you bought just didn't last and you didn't have money to get more. : Never true  Transportation Needs: No Transportation Needs (03/29/2024)   Received from [facility]    In the past 12 months, has lack of reliable transportation kept you from medical appointments, meetings, work or from getting things needed for daily living? : No  Physical Activity: Not on file  Stress: Not on file  Social Connections: Unknown (10/28/2021)   Received from [facility]   Social Network    Social Network: Not on file  Depression (PHQ2-9): Medium Risk (02/04/2024)   Depression (PHQ2-9)    PHQ-2 Score: 6  Alcohol Screen: Not on file  Housing: Low Risk (03/29/2024)   Received from [facility]   Epic    What is your living situation today?: I have a steady place to live    Think about the place you live. Do you have problems with any of the following? Choose all that apply:: None/None on this list  Utilities: Low Risk (03/29/2024)   Received from [facility]   Utilities    In the past 12 months has the electric, gas, oil, or water company threatened to shut off services in your home? : No  Health Literacy: Not on  file     Family History: The patient's family history includes Cancer in his mother; Diabetes in his father; Hypertension in his father; Stroke in his father. ROS:   Please see the history of present illness.    All 14 point review of systems negative except as described per history of present illness  EKGs/Labs/Other Studies Reviewed:     Zio patch monitor showed: Epifanio Alm HERO, MD - 05/10/2024  Formatting of this note might be different from the original.  FINAL PROVIDER INTERPRETATION:  Agree with Findings.   191 episodes of atrial tachycardia lasting 4-75375 beats with average rate 124 bpm. Longer episodes may reflect reentry.   PRELIMINARY FINDINGS:  Patient had a min HR of 53 bpm, max HR of 179 bpm, and avg HR of 83 bpm. Predominant underlying rhythm was Sinus Rhythm. 3 Ventricular Tachycardia runs occurred, the  run with the fastest interval lasting 9 beats with a max rate of 179 bpm, the longest lasting 7 beats with an avg rate of 133 bpm. 191 Supraventricular Tachycardia runs occurred, the run with the fastest interval lasting 56.2 secs with a max rate of 176 bpm, the longest lasting 9 hours 21 mins with an avg rate of 135 bpm. Isolated SVEs were rare (<1.0%), SVE Couplets were rare (<1.0%), and SVE Triplets were rare (<1.0%). Isolated VEs were rare (<1.0%, 2175), VE Couplets were rare (<1.0%, 3), and VE Triplets were rare (<1.0%, 2). Ventricular Bigeminy and Trigeminy were present.   Transthoracic echocardiogram done in March 29, 2024 showed: SUMMARY  The left ventricular cavity is small.  There is moderate concentric left ventricular hypertrophy with normal wall motion, normal systolic  function and ejection fraction  55-60% .  Left ventricular diastolic function and atrial pressure are indeterminate due to  E and A fusion  The right ventricle is normal in size and function.  The atria are normal in size.  There is no significant valvular stenosis or regurgitation.   There was insufficient TR detected to calculate RV systolic pressure.  The inferior vena cava was not visualized during the exam.  The aortic root is normal.  There is no comparison study available.   Recent Labs: 02/04/2024: ALT 12; BUN 24; Creatinine, Ser 2.80; Hemoglobin 11.7; Magnesium 2.0; Platelets 375; Potassium 4.2; Sodium 141; TSH 1.410  Recent Lipid Panel    Component Value Date/Time   CHOL 133 12/01/2022 1026   TRIG 203 (H) 12/01/2022 1026   HDL 44 12/01/2022 1026   CHOLHDL 3.0 12/01/2022 1026   CHOLHDL 3.5 08/01/2021 0000   LDLCALC 56 12/01/2022 1026   LDLCALC 82 08/01/2021 0000    Physical Exam:    VS:  BP (!) 140/90   Pulse 82   Ht 6' 2 (1.88 m)   Wt 287 lb (130.2 kg)   SpO2 99%   BMI 36.85 kg/m     Wt Readings from Last 3 Encounters:  05/29/24 287 lb (130.2 kg)  05/01/24 288 lb (130.6 kg)  02/04/24 289 lb (131.1 kg)     GEN:  Well nourished, well developed in no acute distress HEENT: Normal NECK: No JVD; No carotid bruits LYMPHATICS: No lymphadenopathy CARDIAC: RRR, no murmurs, no rubs, no gallops RESPIRATORY:  Clear to auscultation without rales, wheezing or rhonchi  ABDOMEN: Soft, non-tender, non-distended MUSCULOSKELETAL: 1+ edema; No deformity  SKIN: Warm and dry LOWER EXTREMITIES: no swelling NEUROLOGIC:  Alert and oriented x 3 PSYCHIATRIC:  Normal affect   ASSESSMENT:    1. Orthostatic hypotension   2. Supraventricular tachycardia   3. Chronic diastolic congestive heart failure (HCC)   4. AV block, 1st degree   5. OSA (obstructive sleep apnea)    PLAN:    In order of problems listed above:  Orthostatic hypotension, still present therefore will permit his [place] elevated blood pressure.  No recent episode of syncope except episode in October that brought him to the hospital. Episode of supraventricular tachycardia some lasting up to 9 hours with he is metoprolol  ready 100 mg twice daily and first-degree AV block I will do EKG today,  we will refer him to our EP team for consideration of additional medication, I do not think he would be good candidate for ablation,. Chronic diastolic congestive heart failure, seems to be fairly compensated,   Medication Adjustments/Labs and Tests Ordered: Current medicines are reviewed at length with the patient today.  Concerns regarding medicines are outlined above.  No orders of the defined types were placed in this encounter.  Medication changes: No orders of the defined types were placed in this encounter.   Signed, Lamar DOROTHA Fitch, MD, [facility] 05/29/2024 9:59 AM    [facility]    [1]  Current Meds  Medication Sig   albuterol  (VENTOLIN  HFA) 108 (90 Base) MCG/ACT inhaler INHALE 2 PUFFS INTO THE LUNGS EVERY 6 HOURS AS NEEDED FOR WHEEZING OR SHORTNESS OF BREATH   AMBULATORY NON FORMULARY MEDICATION Continuous positive airway pressure (CPAP) machine set at autopap from 5-20cmH20, with all supplemental supplies as needed. (Patient taking differently: Inhale 5-20 cm into the lungs as needed (Sleep aid). Continuous positive airway pressure (CPAP) machine set at autopap from 5-20cmH20, with all supplemental supplies as needed.)   aspirin  EC 81 MG tablet Take 81 mg by mouth daily.   atorvastatin  (LIPITOR) 80 MG tablet TAKE 1 TABLET(80 MG) BY MOUTH DAILY   Continuous Glucose Receiver (FREESTYLE LIBRE 3 READER) DEVI Use as directed with sensors.   Continuous Glucose Sensor (FREESTYLE LIBRE 3 SENSOR) MISC Place 1 sensor on the skin every 14 days. Use to check glucose continuously   finasteride  (PROSCAR ) 5 MG tablet Take 1 tablet (5 mg total) by mouth daily.   fluticasone  (FLONASE ) 50 MCG/ACT nasal spray USE 2 SPRAYS IN EACH NOSTRIL DAILY (Patient taking differently: Place 1 spray into both nostrils daily.)   Fluticasone -Umeclidin-Vilant (TRELEGY ELLIPTA ) 100-62.5-25 MCG/ACT AEPB Inhale 1 puff into the lungs daily.   hydrALAZINE  (APRESOLINE ) 25 MG tablet TAKE 1  TABLET(25 MG) BY MOUTH THREE TIMES DAILY   losartan  (COZAAR ) 25 MG tablet TAKE 1 TABLET(25 MG) BY MOUTH DAILY (Patient taking differently: Take 50 mg by mouth daily.)   metoprolol  tartrate (LOPRESSOR ) 100 MG tablet Take 1 tablet (100 mg total) by mouth 2 (two) times daily.   pantoprazole  (PROTONIX ) 40 MG tablet Take 1 tablet (40 mg total) by mouth 2 (two) times daily. TAKE 1 TABLET(40 MG) BY MOUTH TWICE DAILY   Semaglutide , 2 MG/DOSE, (OZEMPIC , 2 MG/DOSE,) 8 MG/3ML SOPN 2mg  weekly Epps injection.   tamsulosin  (FLOMAX ) 0.4 MG CAPS capsule Take 0.4 mg by mouth daily.

## 2024-05-30 ENCOUNTER — Ambulatory Visit

## 2024-05-30 ENCOUNTER — Other Ambulatory Visit (HOSPITAL_BASED_OUTPATIENT_CLINIC_OR_DEPARTMENT_OTHER): Payer: Self-pay

## 2024-05-30 VITALS — BP 142/87 | HR 78 | Resp 18 | Ht 74.0 in | Wt 287.0 lb

## 2024-05-30 DIAGNOSIS — I10 Essential (primary) hypertension: Secondary | ICD-10-CM

## 2024-05-30 MED ORDER — FREESTYLE LIBRE 3 SENSOR MISC
1 refills | Status: AC
  Filled 2024-05-30: qty 6, 84d supply, fill #0

## 2024-06-02 ENCOUNTER — Other Ambulatory Visit (HOSPITAL_BASED_OUTPATIENT_CLINIC_OR_DEPARTMENT_OTHER): Payer: Self-pay

## 2024-06-07 ENCOUNTER — Telehealth: Payer: Self-pay

## 2024-06-07 NOTE — Progress Notes (Signed)
" ° °  06/07/2024  Patient ID: Jeremiah Thomas, male   DOB: 12-16-1955, 68 y.o.   MRN: [id number]  This patient is appearing on a report for being at risk of failing the adherence measure for cholesterol (statin) medications this calendar year.   Medication: atorvastatin  80mg  Last fill date: 04/26/24 for 90 day supply  Insurance report was not up to date. No action needed at this time.   [name], PharmD, DPLA   "

## 2024-06-22 ENCOUNTER — Other Ambulatory Visit: Payer: Self-pay | Admitting: Physician Assistant

## 2024-06-22 NOTE — Progress Notes (Signed)
 Jeremiah Thomas                                          MRN: [id number]   06/22/2024   The [facility] Quality Team Specialist reviewed this patient medical record for the purposes of chart review for care gap closure. The following were reviewed: chart review for care gap closure-controlling blood pressure.    [facility] Quality Team

## 2024-07-03 ENCOUNTER — Encounter: Payer: Self-pay | Admitting: Cardiology

## 2024-07-03 ENCOUNTER — Ambulatory Visit: Attending: Cardiology | Admitting: Cardiology

## 2024-07-03 VITALS — BP 158/93 | HR 84 | Ht 74.0 in | Wt 282.0 lb

## 2024-07-03 DIAGNOSIS — I471 Supraventricular tachycardia, unspecified: Secondary | ICD-10-CM

## 2024-07-03 DIAGNOSIS — I10 Essential (primary) hypertension: Secondary | ICD-10-CM

## 2024-07-03 MED ORDER — DILTIAZEM HCL ER COATED BEADS 180 MG PO CP24: 180.0000 mg | ORAL_CAPSULE | Freq: Every day | ORAL | 3 refills | Status: AC

## 2024-07-03 NOTE — Progress Notes (Signed)
" °  Electrophysiology Office Note:   Date:  07/03/2024  ID:  Jeremiah Thomas, DOB [birth year], MRN [id number]  Primary Cardiologist: None Primary Heart Failure: None Electrophysiologist: Vergia Chea Gladis Norton, MD      History of Present Illness:   Jeremiah Thomas is a 69 y.o. male with h/o CKD, orthostatic hypotension, obstructive sleep apnea, diabetes, SVT seen today for  for Electrophysiology evaluation of SVT at the request of Lamar Fitch.    He went to [facility] with an episode of syncope.  He was found to have orthostatic hypotension.  He had arrhythmias and a monitor was placed showing short runs open circular tachycardia as well as an episode of SVT lasting up to 9 hours.  He has been on metoprolol  and was on this medication while wearing the monitor.  Discussed the use of AI scribe software for clinical note transcription with the patient, who gave verbal consent to proceed.  History of Present Illness Jeremiah Thomas is a 69 year old male who presents with heart fluttering and episodes of syncope. He is accompanied by his wife. He was referred by another doctor for evaluation of heart fluttering and syncope.  He experiences frequent episodes of heart fluttering, lasting between 15 to 30 minutes, occurring both during the day and night, while sitting or lying down. Deep breaths sometimes provide slight relief. Occasionally, these episodes are accompanied by sharp chest pain, though not severe. No specific triggers or consistent alleviating factors have been identified.  He is currently taking metoprolol  for heart rhythm issues but continues to experience symptoms. His blood pressure has been variable, with readings 'up and all over the place' according to the patient. He is currently taking losartan , 25 mg daily.    Review of systems complete and found to be negative unless listed in HPI.   EP Information / Studies Reviewed:    EKG is not ordered today. EKG from 05/29/24 reviewed  which showed sinus rhythm      Physical Exam:   VS:  There were no vitals taken for this visit.   Wt Readings from Last 3 Encounters:  05/30/24 287 lb (130.2 kg)  05/29/24 287 lb (130.2 kg)  05/01/24 288 lb (130.6 kg)     GEN: Well nourished, well developed in no acute distress NECK: No JVD; No carotid bruits CARDIAC: Regular rate and rhythm, no murmurs, rubs, gallops RESPIRATORY:  Clear to auscultation without rales, wheezing or rhonchi  ABDOMEN: Soft, non-tender, non-distended EXTREMITIES:  No edema; No deformity   Risk Assessment/Calculations:     ASSESSMENT AND PLAN:    1.  SVT: Has been having multiple episodes of SVT and can feel quite poorly.  They have occur a few times a week.  He is on max dose metoprolol .  He has not interested in procedures at this time.  Moyinoluwa Dawe start diltiazem  180 mg daily.  If this controls his symptoms, we Anitria Andon continue with current dose and likely have him follow-up with his primary cardiologist.  2.  Hypertension: Blood pressure is elevated today.  He has been well-controlled in the past.  His losartan  has recently been increased.  He has follow-up with his primary cardiologist in March.  He Derriona Branscom bring blood pressure results at that time.  Follow up with EP Team in 3 months  Signed, Albertus Chiarelli Gladis Norton, MD  "

## 2024-07-03 NOTE — Patient Instructions (Signed)
 Medication Instructions:  Your physician has recommended you make the following change in your medication:  1) START taking diltiazem  180 mg once daily *If you need a refill on your cardiac medications before your next appointment, please call your pharmacy*  Follow-Up: At [facility], you and your health needs are our priority.  As part of our continuing mission to provide you with exceptional heart care, our providers are all part of one team.  This team includes your primary Cardiologist (physician) and Advanced Practice Providers or APPs (Physician Assistants and Nurse Practitioners) who all work together to provide you with the care you need, when you need it.  Your next appointment:   3 month(s)  Provider:   You will see one of the following Advanced Practice Providers on your designated Care Team:   Charlies Arthur, [place] Ozell Jodie Passey, PA-C Suzann Riddle, NP Daphne Barrack, NP Artist Pouch, PA-C

## 2024-07-12 ENCOUNTER — Ambulatory Visit: Admitting: Cardiology

## 2024-08-01 ENCOUNTER — Ambulatory Visit: Admitting: Physician Assistant

## 2024-08-14 ENCOUNTER — Other Ambulatory Visit

## 2024-08-28 ENCOUNTER — Ambulatory Visit

## 2024-09-25 ENCOUNTER — Ambulatory Visit: Admitting: Cardiology
